# Patient Record
Sex: Female | Born: 1975 | Race: White | Hispanic: No | Smoking: Former smoker
Health system: Southern US, Community
[De-identification: ages and names within clinical notes are randomized; demographics above are authoritative.]

## PROBLEM LIST (undated history)

## (undated) DIAGNOSIS — F1990 Other psychoactive substance use, unspecified, uncomplicated: Secondary | ICD-10-CM

## (undated) DIAGNOSIS — F1010 Alcohol abuse, uncomplicated: Secondary | ICD-10-CM

## (undated) DIAGNOSIS — G629 Polyneuropathy, unspecified: Secondary | ICD-10-CM

## (undated) DIAGNOSIS — G8929 Other chronic pain: Secondary | ICD-10-CM

## (undated) HISTORY — PX: FINGER FRACTURE SURGERY: SHX638

## (undated) HISTORY — PX: WISDOM TOOTH EXTRACTION: SHX21

## (undated) HISTORY — PX: BREAST ENHANCEMENT SURGERY: SHX7

---

## 1997-07-15 ENCOUNTER — Other Ambulatory Visit: Admission: RE | Admit: 1997-07-15 | Discharge: 1997-07-15 | Payer: Self-pay | Admitting: *Deleted

## 1997-08-14 ENCOUNTER — Emergency Department (HOSPITAL_COMMUNITY): Admission: EM | Admit: 1997-08-14 | Discharge: 1997-08-14 | Payer: Self-pay | Admitting: Emergency Medicine

## 1998-02-12 ENCOUNTER — Inpatient Hospital Stay (HOSPITAL_COMMUNITY): Admission: AD | Admit: 1998-02-12 | Discharge: 1998-02-15 | Payer: Self-pay | Admitting: *Deleted

## 1998-02-18 ENCOUNTER — Encounter (HOSPITAL_COMMUNITY): Admission: RE | Admit: 1998-02-18 | Discharge: 1998-05-19 | Payer: Self-pay | Admitting: Psychiatry

## 1998-11-02 ENCOUNTER — Other Ambulatory Visit: Admission: RE | Admit: 1998-11-02 | Discharge: 1998-11-02 | Payer: Self-pay | Admitting: *Deleted

## 1999-01-23 ENCOUNTER — Inpatient Hospital Stay (HOSPITAL_COMMUNITY): Admission: EM | Admit: 1999-01-23 | Discharge: 1999-01-26 | Payer: Self-pay | Admitting: *Deleted

## 1999-03-07 ENCOUNTER — Other Ambulatory Visit: Admission: RE | Admit: 1999-03-07 | Discharge: 1999-03-07 | Payer: Self-pay | Admitting: *Deleted

## 1999-04-04 ENCOUNTER — Other Ambulatory Visit: Admission: RE | Admit: 1999-04-04 | Discharge: 1999-04-04 | Payer: Self-pay | Admitting: *Deleted

## 1999-08-03 ENCOUNTER — Other Ambulatory Visit: Admission: RE | Admit: 1999-08-03 | Discharge: 1999-08-03 | Payer: Self-pay | Admitting: *Deleted

## 2001-02-07 ENCOUNTER — Other Ambulatory Visit: Admission: RE | Admit: 2001-02-07 | Discharge: 2001-02-07 | Payer: Self-pay | Admitting: *Deleted

## 2001-11-14 ENCOUNTER — Emergency Department (HOSPITAL_COMMUNITY): Admission: EM | Admit: 2001-11-14 | Discharge: 2001-11-14 | Payer: Self-pay | Admitting: *Deleted

## 2003-02-07 HISTORY — PX: AUGMENTATION MAMMAPLASTY: SUR837

## 2010-02-06 HISTORY — PX: AUGMENTATION MAMMAPLASTY: SUR837

## 2012-04-24 DIAGNOSIS — M545 Low back pain, unspecified: Secondary | ICD-10-CM | POA: Insufficient documentation

## 2015-09-06 DIAGNOSIS — F19130 Other psychoactive substance abuse with withdrawal, uncomplicated: Secondary | ICD-10-CM | POA: Insufficient documentation

## 2019-08-07 ENCOUNTER — Encounter (HOSPITAL_COMMUNITY): Payer: Self-pay | Admitting: Emergency Medicine

## 2019-08-07 ENCOUNTER — Inpatient Hospital Stay (HOSPITAL_COMMUNITY)
Admission: EM | Admit: 2019-08-07 | Discharge: 2019-09-08 | DRG: 471 | Disposition: A | Discharge status: Home / Self Care | Payer: Medicaid Other | Attending: Internal Medicine | Admitting: Internal Medicine

## 2019-08-07 ENCOUNTER — Other Ambulatory Visit: Payer: Self-pay

## 2019-08-07 DIAGNOSIS — W19XXXA Unspecified fall, initial encounter: Secondary | ICD-10-CM | POA: Diagnosis not present

## 2019-08-07 DIAGNOSIS — Z720 Tobacco use: Secondary | ICD-10-CM

## 2019-08-07 DIAGNOSIS — M25519 Pain in unspecified shoulder: Secondary | ICD-10-CM | POA: Diagnosis not present

## 2019-08-07 DIAGNOSIS — M62838 Other muscle spasm: Secondary | ICD-10-CM | POA: Diagnosis not present

## 2019-08-07 DIAGNOSIS — F1011 Alcohol abuse, in remission: Secondary | ICD-10-CM | POA: Diagnosis present

## 2019-08-07 DIAGNOSIS — F1110 Opioid abuse, uncomplicated: Secondary | ICD-10-CM | POA: Diagnosis present

## 2019-08-07 DIAGNOSIS — M4642 Discitis, unspecified, cervical region: Principal | ICD-10-CM | POA: Diagnosis present

## 2019-08-07 DIAGNOSIS — R000 Tachycardia, unspecified: Secondary | ICD-10-CM | POA: Diagnosis present

## 2019-08-07 DIAGNOSIS — F17210 Nicotine dependence, cigarettes, uncomplicated: Secondary | ICD-10-CM | POA: Diagnosis present

## 2019-08-07 DIAGNOSIS — R739 Hyperglycemia, unspecified: Secondary | ICD-10-CM | POA: Diagnosis present

## 2019-08-07 DIAGNOSIS — R7989 Other specified abnormal findings of blood chemistry: Secondary | ICD-10-CM | POA: Diagnosis present

## 2019-08-07 DIAGNOSIS — D649 Anemia, unspecified: Secondary | ICD-10-CM | POA: Diagnosis present

## 2019-08-07 DIAGNOSIS — M542 Cervicalgia: Secondary | ICD-10-CM | POA: Diagnosis present

## 2019-08-07 DIAGNOSIS — R202 Paresthesia of skin: Secondary | ICD-10-CM | POA: Diagnosis present

## 2019-08-07 DIAGNOSIS — K759 Inflammatory liver disease, unspecified: Secondary | ICD-10-CM

## 2019-08-07 DIAGNOSIS — B182 Chronic viral hepatitis C: Secondary | ICD-10-CM

## 2019-08-07 DIAGNOSIS — Z20822 Contact with and (suspected) exposure to covid-19: Secondary | ICD-10-CM | POA: Diagnosis present

## 2019-08-07 DIAGNOSIS — M48061 Spinal stenosis, lumbar region without neurogenic claudication: Secondary | ICD-10-CM | POA: Diagnosis present

## 2019-08-07 DIAGNOSIS — Z820 Family history of epilepsy and other diseases of the nervous system: Secondary | ICD-10-CM

## 2019-08-07 DIAGNOSIS — G629 Polyneuropathy, unspecified: Secondary | ICD-10-CM | POA: Diagnosis present

## 2019-08-07 DIAGNOSIS — Z6821 Body mass index (BMI) 21.0-21.9, adult: Secondary | ICD-10-CM

## 2019-08-07 DIAGNOSIS — M4312 Spondylolisthesis, cervical region: Secondary | ICD-10-CM | POA: Diagnosis present

## 2019-08-07 DIAGNOSIS — T366X5A Adverse effect of rifampicins, initial encounter: Secondary | ICD-10-CM | POA: Diagnosis not present

## 2019-08-07 DIAGNOSIS — R200 Anesthesia of skin: Secondary | ICD-10-CM | POA: Diagnosis not present

## 2019-08-07 DIAGNOSIS — Z880 Allergy status to penicillin: Secondary | ICD-10-CM

## 2019-08-07 DIAGNOSIS — Z419 Encounter for procedure for purposes other than remedying health state, unspecified: Secondary | ICD-10-CM

## 2019-08-07 DIAGNOSIS — G4700 Insomnia, unspecified: Secondary | ICD-10-CM | POA: Diagnosis not present

## 2019-08-07 DIAGNOSIS — R748 Abnormal levels of other serum enzymes: Secondary | ICD-10-CM | POA: Diagnosis not present

## 2019-08-07 DIAGNOSIS — M4802 Spinal stenosis, cervical region: Secondary | ICD-10-CM | POA: Diagnosis present

## 2019-08-07 DIAGNOSIS — Z8261 Family history of arthritis: Secondary | ICD-10-CM

## 2019-08-07 DIAGNOSIS — R258 Other abnormal involuntary movements: Secondary | ICD-10-CM | POA: Diagnosis present

## 2019-08-07 DIAGNOSIS — F1990 Other psychoactive substance use, unspecified, uncomplicated: Secondary | ICD-10-CM

## 2019-08-07 DIAGNOSIS — M5126 Other intervertebral disc displacement, lumbar region: Secondary | ICD-10-CM | POA: Diagnosis present

## 2019-08-07 DIAGNOSIS — T847XXA Infection and inflammatory reaction due to other internal orthopedic prosthetic devices, implants and grafts, initial encounter: Secondary | ICD-10-CM

## 2019-08-07 DIAGNOSIS — Z888 Allergy status to other drugs, medicaments and biological substances status: Secondary | ICD-10-CM

## 2019-08-07 DIAGNOSIS — T859XXA Unspecified complication of internal prosthetic device, implant and graft, initial encounter: Secondary | ICD-10-CM | POA: Diagnosis not present

## 2019-08-07 DIAGNOSIS — E440 Moderate protein-calorie malnutrition: Secondary | ICD-10-CM | POA: Diagnosis not present

## 2019-08-07 DIAGNOSIS — D72825 Bandemia: Secondary | ICD-10-CM | POA: Diagnosis present

## 2019-08-07 DIAGNOSIS — F1910 Other psychoactive substance abuse, uncomplicated: Secondary | ICD-10-CM | POA: Diagnosis present

## 2019-08-07 DIAGNOSIS — R7982 Elevated C-reactive protein (CRP): Secondary | ICD-10-CM | POA: Diagnosis present

## 2019-08-07 DIAGNOSIS — G062 Extradural and subdural abscess, unspecified: Secondary | ICD-10-CM

## 2019-08-07 DIAGNOSIS — M549 Dorsalgia, unspecified: Secondary | ICD-10-CM

## 2019-08-07 DIAGNOSIS — G8929 Other chronic pain: Secondary | ICD-10-CM | POA: Diagnosis present

## 2019-08-07 DIAGNOSIS — R7881 Bacteremia: Secondary | ICD-10-CM | POA: Diagnosis present

## 2019-08-07 DIAGNOSIS — Z590 Homelessness: Secondary | ICD-10-CM

## 2019-08-07 DIAGNOSIS — B9561 Methicillin susceptible Staphylococcus aureus infection as the cause of diseases classified elsewhere: Secondary | ICD-10-CM | POA: Diagnosis present

## 2019-08-07 DIAGNOSIS — G061 Intraspinal abscess and granuloma: Secondary | ICD-10-CM | POA: Diagnosis present

## 2019-08-07 HISTORY — DX: Polyneuropathy, unspecified: G62.9

## 2019-08-07 HISTORY — DX: Other psychoactive substance use, unspecified, uncomplicated: F19.90

## 2019-08-07 HISTORY — DX: Other chronic pain: G89.29

## 2019-08-07 HISTORY — DX: Alcohol abuse, uncomplicated: F10.10

## 2019-08-07 NOTE — ED Triage Notes (Addendum)
Patient states she has a hx of herniated disk. Patient states that she is having pain in her upper back. Patient states that it is a herniated disc. Patient yelling and crying.

## 2019-08-08 ENCOUNTER — Observation Stay (HOSPITAL_COMMUNITY): Payer: Medicaid Other | Admitting: Certified Registered Nurse Anesthetist

## 2019-08-08 ENCOUNTER — Encounter (HOSPITAL_COMMUNITY): Payer: Self-pay | Admitting: Internal Medicine

## 2019-08-08 ENCOUNTER — Encounter (HOSPITAL_COMMUNITY)
Admission: EM | Disposition: A | Discharge status: Home / Self Care | Payer: Self-pay | Source: Home / Self Care | Attending: Internal Medicine

## 2019-08-08 ENCOUNTER — Observation Stay (HOSPITAL_COMMUNITY): Payer: Medicaid Other

## 2019-08-08 ENCOUNTER — Inpatient Hospital Stay (HOSPITAL_COMMUNITY)
Admission: EM | Disposition: A | Discharge status: Home / Self Care | Payer: Self-pay | Source: Home / Self Care | Attending: Internal Medicine

## 2019-08-08 ENCOUNTER — Inpatient Hospital Stay (HOSPITAL_COMMUNITY): Payer: Medicaid Other | Admitting: Anesthesiology

## 2019-08-08 ENCOUNTER — Inpatient Hospital Stay (HOSPITAL_COMMUNITY): Payer: Medicaid Other

## 2019-08-08 ENCOUNTER — Emergency Department (HOSPITAL_COMMUNITY): Payer: Medicaid Other

## 2019-08-08 DIAGNOSIS — R7982 Elevated C-reactive protein (CRP): Secondary | ICD-10-CM | POA: Diagnosis present

## 2019-08-08 DIAGNOSIS — D72825 Bandemia: Secondary | ICD-10-CM | POA: Diagnosis present

## 2019-08-08 DIAGNOSIS — R7989 Other specified abnormal findings of blood chemistry: Secondary | ICD-10-CM | POA: Diagnosis present

## 2019-08-08 DIAGNOSIS — M4802 Spinal stenosis, cervical region: Secondary | ICD-10-CM | POA: Diagnosis present

## 2019-08-08 DIAGNOSIS — F17210 Nicotine dependence, cigarettes, uncomplicated: Secondary | ICD-10-CM | POA: Diagnosis present

## 2019-08-08 DIAGNOSIS — G062 Extradural and subdural abscess, unspecified: Secondary | ICD-10-CM | POA: Diagnosis not present

## 2019-08-08 DIAGNOSIS — F1990 Other psychoactive substance use, unspecified, uncomplicated: Secondary | ICD-10-CM

## 2019-08-08 DIAGNOSIS — B9561 Methicillin susceptible Staphylococcus aureus infection as the cause of diseases classified elsewhere: Secondary | ICD-10-CM | POA: Diagnosis present

## 2019-08-08 DIAGNOSIS — M542 Cervicalgia: Secondary | ICD-10-CM

## 2019-08-08 DIAGNOSIS — R258 Other abnormal involuntary movements: Secondary | ICD-10-CM | POA: Diagnosis present

## 2019-08-08 DIAGNOSIS — F1910 Other psychoactive substance abuse, uncomplicated: Secondary | ICD-10-CM | POA: Diagnosis not present

## 2019-08-08 DIAGNOSIS — Z720 Tobacco use: Secondary | ICD-10-CM

## 2019-08-08 DIAGNOSIS — Z880 Allergy status to penicillin: Secondary | ICD-10-CM | POA: Diagnosis not present

## 2019-08-08 DIAGNOSIS — Z888 Allergy status to other drugs, medicaments and biological substances status: Secondary | ICD-10-CM | POA: Diagnosis not present

## 2019-08-08 DIAGNOSIS — G8929 Other chronic pain: Secondary | ICD-10-CM | POA: Diagnosis present

## 2019-08-08 DIAGNOSIS — Z6821 Body mass index (BMI) 21.0-21.9, adult: Secondary | ICD-10-CM | POA: Diagnosis not present

## 2019-08-08 DIAGNOSIS — M25519 Pain in unspecified shoulder: Secondary | ICD-10-CM | POA: Diagnosis not present

## 2019-08-08 DIAGNOSIS — M4312 Spondylolisthesis, cervical region: Secondary | ICD-10-CM | POA: Diagnosis present

## 2019-08-08 DIAGNOSIS — E440 Moderate protein-calorie malnutrition: Secondary | ICD-10-CM | POA: Diagnosis not present

## 2019-08-08 DIAGNOSIS — R202 Paresthesia of skin: Secondary | ICD-10-CM | POA: Diagnosis present

## 2019-08-08 DIAGNOSIS — I339 Acute and subacute endocarditis, unspecified: Secondary | ICD-10-CM | POA: Diagnosis not present

## 2019-08-08 DIAGNOSIS — F1110 Opioid abuse, uncomplicated: Secondary | ICD-10-CM | POA: Diagnosis present

## 2019-08-08 DIAGNOSIS — R000 Tachycardia, unspecified: Secondary | ICD-10-CM | POA: Diagnosis present

## 2019-08-08 DIAGNOSIS — R7881 Bacteremia: Secondary | ICD-10-CM | POA: Diagnosis present

## 2019-08-08 DIAGNOSIS — D72829 Elevated white blood cell count, unspecified: Secondary | ICD-10-CM | POA: Diagnosis not present

## 2019-08-08 DIAGNOSIS — D649 Anemia, unspecified: Secondary | ICD-10-CM | POA: Diagnosis present

## 2019-08-08 DIAGNOSIS — Z20822 Contact with and (suspected) exposure to covid-19: Secondary | ICD-10-CM | POA: Diagnosis present

## 2019-08-08 DIAGNOSIS — G8918 Other acute postprocedural pain: Secondary | ICD-10-CM | POA: Diagnosis not present

## 2019-08-08 DIAGNOSIS — M4642 Discitis, unspecified, cervical region: Secondary | ICD-10-CM | POA: Diagnosis present

## 2019-08-08 DIAGNOSIS — G061 Intraspinal abscess and granuloma: Secondary | ICD-10-CM | POA: Diagnosis present

## 2019-08-08 DIAGNOSIS — B182 Chronic viral hepatitis C: Secondary | ICD-10-CM | POA: Diagnosis not present

## 2019-08-08 DIAGNOSIS — R739 Hyperglycemia, unspecified: Secondary | ICD-10-CM | POA: Diagnosis present

## 2019-08-08 DIAGNOSIS — T847XXD Infection and inflammatory reaction due to other internal orthopedic prosthetic devices, implants and grafts, subsequent encounter: Secondary | ICD-10-CM | POA: Diagnosis not present

## 2019-08-08 DIAGNOSIS — W19XXXA Unspecified fall, initial encounter: Secondary | ICD-10-CM | POA: Diagnosis not present

## 2019-08-08 HISTORY — PX: RADIOLOGY WITH ANESTHESIA: SHX6223

## 2019-08-08 LAB — CBC WITH DIFFERENTIAL/PLATELET
Abs Immature Granulocytes: 0.08 10*3/uL — ABNORMAL HIGH (ref 0.00–0.07)
Basophils Absolute: 0 10*3/uL (ref 0.0–0.1)
Basophils Relative: 0 %
Eosinophils Absolute: 0.1 10*3/uL (ref 0.0–0.5)
Eosinophils Relative: 1 %
HCT: 35.8 % — ABNORMAL LOW (ref 36.0–46.0)
Hemoglobin: 12 g/dL (ref 12.0–15.0)
Immature Granulocytes: 0 %
Lymphocytes Relative: 7 %
Lymphs Abs: 1.3 10*3/uL (ref 0.7–4.0)
MCH: 31.7 pg (ref 26.0–34.0)
MCHC: 33.5 g/dL (ref 30.0–36.0)
MCV: 94.5 fL (ref 80.0–100.0)
Monocytes Absolute: 1.4 10*3/uL — ABNORMAL HIGH (ref 0.1–1.0)
Monocytes Relative: 7 %
Neutro Abs: 16.4 10*3/uL — ABNORMAL HIGH (ref 1.7–7.7)
Neutrophils Relative %: 85 %
Platelets: 347 10*3/uL (ref 150–400)
RBC: 3.79 MIL/uL — ABNORMAL LOW (ref 3.87–5.11)
RDW: 12.5 % (ref 11.5–15.5)
WBC: 19.4 10*3/uL — ABNORMAL HIGH (ref 4.0–10.5)
nRBC: 0 % (ref 0.0–0.2)

## 2019-08-08 LAB — CBC
HCT: 36.3 % (ref 36.0–46.0)
Hemoglobin: 11.8 g/dL — ABNORMAL LOW (ref 12.0–15.0)
MCH: 30.9 pg (ref 26.0–34.0)
MCHC: 32.5 g/dL (ref 30.0–36.0)
MCV: 95 fL (ref 80.0–100.0)
Platelets: 344 10*3/uL (ref 150–400)
RBC: 3.82 MIL/uL — ABNORMAL LOW (ref 3.87–5.11)
RDW: 12.5 % (ref 11.5–15.5)
WBC: 18.9 10*3/uL — ABNORMAL HIGH (ref 4.0–10.5)
nRBC: 0 % (ref 0.0–0.2)

## 2019-08-08 LAB — COMPREHENSIVE METABOLIC PANEL
ALT: 22 U/L (ref 0–44)
AST: 30 U/L (ref 15–41)
Albumin: 4.5 g/dL (ref 3.5–5.0)
Alkaline Phosphatase: 89 U/L (ref 38–126)
Anion gap: 9 (ref 5–15)
BUN: 11 mg/dL (ref 6–20)
CO2: 28 mmol/L (ref 22–32)
Calcium: 9 mg/dL (ref 8.9–10.3)
Chloride: 99 mmol/L (ref 98–111)
Creatinine, Ser: 0.49 mg/dL (ref 0.44–1.00)
GFR calc Af Amer: >60 mL/min (ref >60)
GFR calc non Af Amer: >60 mL/min (ref >60)
Glucose, Bld: 143 mg/dL — ABNORMAL HIGH (ref 70–99)
Potassium: 3.5 mmol/L (ref 3.5–5.1)
Sodium: 136 mmol/L (ref 135–145)
Total Bilirubin: 0.8 mg/dL (ref 0.3–1.2)
Total Protein: 8.2 g/dL — ABNORMAL HIGH (ref 6.5–8.1)

## 2019-08-08 LAB — CREATININE, SERUM
Creatinine, Ser: 0.55 mg/dL (ref 0.44–1.00)
GFR calc Af Amer: >60 mL/min (ref >60)
GFR calc non Af Amer: >60 mL/min (ref >60)

## 2019-08-08 LAB — SARS CORONAVIRUS 2 BY RT PCR (HOSPITAL ORDER, PERFORMED IN ~~LOC~~ HOSPITAL LAB): SARS Coronavirus 2: NEGATIVE

## 2019-08-08 LAB — HCG, SERUM, QUALITATIVE: Preg, Serum: NEGATIVE

## 2019-08-08 LAB — C-REACTIVE PROTEIN: CRP: 9.4 mg/dL — ABNORMAL HIGH (ref <1.0)

## 2019-08-08 LAB — SEDIMENTATION RATE: Sed Rate: 18 mm/hr (ref 0–22)

## 2019-08-08 LAB — HIV ANTIBODY (ROUTINE TESTING W REFLEX): HIV Screen 4th Generation wRfx: NONREACTIVE

## 2019-08-08 SURGERY — MRI WITH ANESTHESIA
Anesthesia: General

## 2019-08-08 SURGERY — POSTERIOR LUMBAR FUSION 2 LEVEL
Anesthesia: General | Site: Neck

## 2019-08-08 MED ORDER — ACETAMINOPHEN 160 MG/5ML PO SOLN: 325.0000 mg | Freq: Once | ORAL | Status: DC | PRN

## 2019-08-08 MED ORDER — FENTANYL CITRATE (PF) 100 MCG/2ML IJ SOLN
INTRAMUSCULAR | Status: DC | PRN
  Administered 2019-08-08 (×2): 50 ug via INTRAVENOUS

## 2019-08-08 MED ORDER — ONDANSETRON 4 MG PO TBDP: 4.0000 mg | ORAL_TABLET | Freq: Four times a day (QID) | ORAL | Status: AC | PRN

## 2019-08-08 MED ORDER — PROMETHAZINE HCL 25 MG/ML IJ SOLN: 6.2500 mg | INTRAMUSCULAR | Status: DC | PRN

## 2019-08-08 MED ORDER — NAPROXEN 250 MG PO TABS
500.0000 mg | ORAL_TABLET | Freq: Two times a day (BID) | ORAL | Status: AC | PRN
  Administered 2019-08-09 – 2019-08-12 (×3): 500 mg via ORAL
  Filled 2019-08-08 (×3): qty 2

## 2019-08-08 MED ORDER — MEPERIDINE HCL 25 MG/ML IJ SOLN: 6.2500 mg | INTRAMUSCULAR | Status: DC | PRN

## 2019-08-08 MED ORDER — 0.9 % SODIUM CHLORIDE (POUR BTL) OPTIME
TOPICAL | Status: DC | PRN
  Administered 2019-08-08: 1000 mL

## 2019-08-08 MED ORDER — ACETAMINOPHEN 10 MG/ML IV SOLN: 1000.0000 mg | Freq: Once | INTRAVENOUS | Status: DC | PRN

## 2019-08-08 MED ORDER — FENTANYL CITRATE (PF) 100 MCG/2ML IJ SOLN
INTRAMUSCULAR | Status: AC
  Filled 2019-08-08: qty 2

## 2019-08-08 MED ORDER — KETOROLAC TROMETHAMINE 15 MG/ML IJ SOLN
15.0000 mg | Freq: Once | INTRAMUSCULAR | Status: AC
  Administered 2019-08-08: 15 mg via INTRAVENOUS
  Filled 2019-08-08: qty 1

## 2019-08-08 MED ORDER — HYDROMORPHONE HCL 1 MG/ML IJ SOLN
4.0000 mg | Freq: Once | INTRAMUSCULAR | Status: AC
  Administered 2019-08-08: 4 mg via INTRAVENOUS
  Filled 2019-08-08: qty 4

## 2019-08-08 MED ORDER — DEXAMETHASONE SODIUM PHOSPHATE 10 MG/ML IJ SOLN
INTRAMUSCULAR | Status: DC | PRN
  Administered 2019-08-08: 4 mg via INTRAVENOUS

## 2019-08-08 MED ORDER — PHENYLEPHRINE 40 MCG/ML (10ML) SYRINGE FOR IV PUSH (FOR BLOOD PRESSURE SUPPORT)
PREFILLED_SYRINGE | INTRAVENOUS | Status: AC
  Filled 2019-08-08: qty 10

## 2019-08-08 MED ORDER — OXYCODONE HCL 5 MG PO TABS: 5.0000 mg | ORAL_TABLET | Freq: Once | ORAL | Status: DC | PRN

## 2019-08-08 MED ORDER — PROPOFOL 10 MG/ML IV BOLUS
INTRAVENOUS | Status: DC | PRN
  Administered 2019-08-08: 200 mg via INTRAVENOUS

## 2019-08-08 MED ORDER — HYDROMORPHONE HCL 1 MG/ML IJ SOLN: 0.2500 mg | INTRAMUSCULAR | Status: DC | PRN

## 2019-08-08 MED ORDER — ACETAMINOPHEN 500 MG PO TABS
ORAL_TABLET | ORAL | Status: AC
  Filled 2019-08-08: qty 2

## 2019-08-08 MED ORDER — SUCCINYLCHOLINE 20MG/ML (10ML) SYRINGE FOR MEDFUSION PUMP - OPTIME
INTRAMUSCULAR | Status: DC | PRN
  Administered 2019-08-08: 60 mg via INTRAVENOUS

## 2019-08-08 MED ORDER — LIDOCAINE-EPINEPHRINE 1 %-1:100000 IJ SOLN
INTRAMUSCULAR | Status: DC | PRN
  Administered 2019-08-08: 3.5 mL via INTRADERMAL

## 2019-08-08 MED ORDER — LACTATED RINGERS IV SOLN: INTRAVENOUS | Status: DC | PRN

## 2019-08-08 MED ORDER — PROPOFOL 10 MG/ML IV BOLUS
INTRAVENOUS | Status: AC
  Filled 2019-08-08: qty 20

## 2019-08-08 MED ORDER — ACETAMINOPHEN 325 MG PO TABS: 325.0000 mg | ORAL_TABLET | Freq: Once | ORAL | Status: DC | PRN

## 2019-08-08 MED ORDER — FENTANYL CITRATE (PF) 100 MCG/2ML IJ SOLN
25.0000 ug | INTRAMUSCULAR | Status: DC | PRN
  Administered 2019-08-08 (×3): 50 ug via INTRAVENOUS

## 2019-08-08 MED ORDER — VANCOMYCIN HCL IN DEXTROSE 1-5 GM/200ML-% IV SOLN
1000.0000 mg | Freq: Two times a day (BID) | INTRAVENOUS | Status: DC
  Administered 2019-08-09 – 2019-08-10 (×3): 1000 mg via INTRAVENOUS
  Filled 2019-08-08 (×5): qty 200

## 2019-08-08 MED ORDER — METHOCARBAMOL 1000 MG/10ML IJ SOLN: 500.0000 mg | Freq: Once | INTRAMUSCULAR | Status: DC

## 2019-08-08 MED ORDER — METHOCARBAMOL 1000 MG/10ML IJ SOLN
500.0000 mg | Freq: Once | INTRAVENOUS | Status: AC
  Administered 2019-08-08: 500 mg via INTRAVENOUS
  Filled 2019-08-08: qty 500

## 2019-08-08 MED ORDER — HYDROXYZINE HCL 25 MG PO TABS
25.0000 mg | ORAL_TABLET | Freq: Four times a day (QID) | ORAL | Status: AC | PRN
  Administered 2019-08-08 – 2019-08-11 (×4): 25 mg via ORAL
  Filled 2019-08-08 (×4): qty 1

## 2019-08-08 MED ORDER — DEXMEDETOMIDINE HCL 200 MCG/2ML IV SOLN
INTRAVENOUS | Status: DC | PRN
  Administered 2019-08-08: 8 ug via INTRAVENOUS
  Administered 2019-08-08: 20 ug via INTRAVENOUS

## 2019-08-08 MED ORDER — MIDAZOLAM HCL 2 MG/2ML IJ SOLN
INTRAMUSCULAR | Status: AC
  Filled 2019-08-08: qty 2

## 2019-08-08 MED ORDER — SODIUM CHLORIDE 0.9 % IV SOLN
1000.0000 mL | INTRAVENOUS | Status: DC
  Administered 2019-08-08: 1000 mL via INTRAVENOUS

## 2019-08-08 MED ORDER — CLONIDINE HCL 0.1 MG PO TABS
0.1000 mg | ORAL_TABLET | Freq: Every day | ORAL | Status: AC
  Administered 2019-08-13 – 2019-08-14 (×2): 0.1 mg via ORAL
  Filled 2019-08-08 (×2): qty 1

## 2019-08-08 MED ORDER — BUPIVACAINE HCL (PF) 0.5 % IJ SOLN
INTRAMUSCULAR | Status: DC | PRN
  Administered 2019-08-08: 3.5 mL

## 2019-08-08 MED ORDER — OXYCODONE HCL 5 MG PO TABS
5.0000 mg | ORAL_TABLET | ORAL | Status: DC | PRN
  Administered 2019-08-08 – 2019-08-13 (×11): 10 mg via ORAL
  Administered 2019-08-14: 5 mg via ORAL
  Administered 2019-08-14: 10 mg via ORAL
  Administered 2019-08-14 – 2019-08-15 (×4): 5 mg via ORAL
  Administered 2019-08-17 – 2019-08-19 (×2): 10 mg via ORAL
  Filled 2019-08-08 (×2): qty 1
  Filled 2019-08-08 (×3): qty 2
  Filled 2019-08-08: qty 1
  Filled 2019-08-08: qty 2
  Filled 2019-08-08: qty 1
  Filled 2019-08-08 (×4): qty 2
  Filled 2019-08-08: qty 1
  Filled 2019-08-08 (×3): qty 2
  Filled 2019-08-08 (×2): qty 1
  Filled 2019-08-08 (×4): qty 2

## 2019-08-08 MED ORDER — ACETAMINOPHEN 500 MG PO TABS
1000.0000 mg | ORAL_TABLET | Freq: Once | ORAL | Status: AC
  Administered 2019-08-08: 1000 mg via ORAL
  Filled 2019-08-08: qty 2

## 2019-08-08 MED ORDER — LACTATED RINGERS IV SOLN: INTRAVENOUS | Status: DC

## 2019-08-08 MED ORDER — ACETAMINOPHEN 325 MG PO TABS
650.0000 mg | ORAL_TABLET | Freq: Four times a day (QID) | ORAL | Status: DC | PRN
  Administered 2019-08-10 – 2019-09-03 (×3): 650 mg via ORAL
  Filled 2019-08-08 (×3): qty 2

## 2019-08-08 MED ORDER — LIDOCAINE-EPINEPHRINE 1 %-1:100000 IJ SOLN
INTRAMUSCULAR | Status: AC
  Filled 2019-08-08: qty 1

## 2019-08-08 MED ORDER — PHENYLEPHRINE HCL (PRESSORS) 10 MG/ML IV SOLN
INTRAVENOUS | Status: DC | PRN
  Administered 2019-08-08 (×3): 80 ug via INTRAVENOUS

## 2019-08-08 MED ORDER — FENTANYL CITRATE (PF) 250 MCG/5ML IJ SOLN
INTRAMUSCULAR | Status: DC | PRN
  Administered 2019-08-08: 50 ug via INTRAVENOUS
  Administered 2019-08-08 (×2): 100 ug via INTRAVENOUS

## 2019-08-08 MED ORDER — SUGAMMADEX SODIUM 200 MG/2ML IV SOLN
INTRAVENOUS | Status: DC | PRN
  Administered 2019-08-08: 200 mg via INTRAVENOUS

## 2019-08-08 MED ORDER — THROMBIN 5000 UNITS EX SOLR
OROMUCOSAL | Status: DC | PRN
  Administered 2019-08-08 (×2): 5 mL via TOPICAL

## 2019-08-08 MED ORDER — MIDAZOLAM HCL 5 MG/5ML IJ SOLN
INTRAMUSCULAR | Status: DC | PRN
  Administered 2019-08-08: 2 mg via INTRAVENOUS

## 2019-08-08 MED ORDER — HYDROMORPHONE HCL 2 MG/ML IJ SOLN: 4.0000 mg | Freq: Once | INTRAMUSCULAR | Status: DC

## 2019-08-08 MED ORDER — ROCURONIUM 10MG/ML (10ML) SYRINGE FOR MEDFUSION PUMP - OPTIME
INTRAVENOUS | Status: DC | PRN
  Administered 2019-08-08: 20 mg via INTRAVENOUS
  Administered 2019-08-08: 10 mg via INTRAVENOUS
  Administered 2019-08-08: 40 mg via INTRAVENOUS

## 2019-08-08 MED ORDER — CLONIDINE HCL 0.1 MG PO TABS
0.1000 mg | ORAL_TABLET | ORAL | Status: AC
  Administered 2019-08-11 – 2019-08-12 (×4): 0.1 mg via ORAL
  Filled 2019-08-08 (×4): qty 1

## 2019-08-08 MED ORDER — PHENYLEPHRINE HCL-NACL 10-0.9 MG/250ML-% IV SOLN
INTRAVENOUS | Status: DC | PRN
  Administered 2019-08-08: 50 ug/min via INTRAVENOUS

## 2019-08-08 MED ORDER — SODIUM CHLORIDE 0.9 % IV SOLN
INTRAVENOUS | Status: DC | PRN
  Administered 2019-08-08: 500 mL

## 2019-08-08 MED ORDER — OXYCODONE HCL 5 MG PO TABS: 5.0000 mg | ORAL_TABLET | ORAL | Status: DC | PRN

## 2019-08-08 MED ORDER — LOPERAMIDE HCL 2 MG PO CAPS: 2.0000 mg | ORAL_CAPSULE | ORAL | Status: AC | PRN

## 2019-08-08 MED ORDER — KETOROLAC TROMETHAMINE 60 MG/2ML IM SOLN: 30.0000 mg | Freq: Once | INTRAMUSCULAR | Status: DC

## 2019-08-08 MED ORDER — BUPIVACAINE HCL (PF) 0.5 % IJ SOLN
INTRAMUSCULAR | Status: AC
  Filled 2019-08-08: qty 30

## 2019-08-08 MED ORDER — DEXAMETHASONE SODIUM PHOSPHATE 10 MG/ML IJ SOLN
INTRAMUSCULAR | Status: DC | PRN
  Administered 2019-08-08: 10 mg via INTRAVENOUS

## 2019-08-08 MED ORDER — HYDROMORPHONE HCL 1 MG/ML IJ SOLN: 0.5000 mg | INTRAMUSCULAR | Status: DC | PRN

## 2019-08-08 MED ORDER — HYDROMORPHONE HCL 1 MG/ML IJ SOLN
2.0000 mg | Freq: Once | INTRAMUSCULAR | Status: AC
  Administered 2019-08-08: 2 mg via INTRAVENOUS
  Filled 2019-08-08: qty 2

## 2019-08-08 MED ORDER — ACETAMINOPHEN 325 MG PO TABS
650.0000 mg | ORAL_TABLET | Freq: Once | ORAL | Status: AC
  Administered 2019-08-08: 650 mg via ORAL
  Filled 2019-08-08: qty 2

## 2019-08-08 MED ORDER — METHOCARBAMOL 500 MG PO TABS
500.0000 mg | ORAL_TABLET | Freq: Three times a day (TID) | ORAL | Status: DC | PRN
  Administered 2019-08-08 – 2019-08-10 (×4): 500 mg via ORAL
  Filled 2019-08-08 (×5): qty 1

## 2019-08-08 MED ORDER — HYDROMORPHONE HCL 1 MG/ML IJ SOLN
1.0000 mg | INTRAMUSCULAR | Status: DC | PRN
  Administered 2019-08-08 – 2019-08-11 (×18): 2 mg via INTRAVENOUS
  Administered 2019-08-11 (×3): 1 mg via INTRAVENOUS
  Administered 2019-08-11 (×3): 2 mg via INTRAVENOUS
  Administered 2019-08-12: 1 mg via INTRAVENOUS
  Administered 2019-08-12: 2 mg via INTRAVENOUS
  Administered 2019-08-12 (×5): 1 mg via INTRAVENOUS
  Administered 2019-08-13 (×4): 2 mg via INTRAVENOUS
  Administered 2019-08-14: 1 mg via INTRAVENOUS
  Administered 2019-08-14 (×3): 2 mg via INTRAVENOUS
  Administered 2019-08-14: 1 mg via INTRAVENOUS
  Administered 2019-08-14 (×2): 2 mg via INTRAVENOUS
  Administered 2019-08-14: 1 mg via INTRAVENOUS
  Administered 2019-08-14 – 2019-08-17 (×28): 2 mg via INTRAVENOUS
  Administered 2019-08-17: 1 mg via INTRAVENOUS
  Administered 2019-08-17: 2 mg via INTRAVENOUS
  Administered 2019-08-17: 1 mg via INTRAVENOUS
  Administered 2019-08-17 – 2019-08-18 (×4): 2 mg via INTRAVENOUS
  Administered 2019-08-18: 1 mg via INTRAVENOUS
  Administered 2019-08-18: 2 mg via INTRAVENOUS
  Administered 2019-08-18: 1 mg via INTRAVENOUS
  Administered 2019-08-18: 2 mg via INTRAVENOUS
  Administered 2019-08-18: 1 mg via INTRAVENOUS
  Administered 2019-08-18: 2 mg via INTRAVENOUS
  Administered 2019-08-18: 1 mg via INTRAVENOUS
  Administered 2019-08-18: 2 mg via INTRAVENOUS
  Administered 2019-08-18: 1 mg via INTRAVENOUS
  Administered 2019-08-19 (×4): 2 mg via INTRAVENOUS
  Filled 2019-08-08 (×4): qty 2
  Filled 2019-08-08: qty 1
  Filled 2019-08-08 (×2): qty 2
  Filled 2019-08-08: qty 1
  Filled 2019-08-08: qty 2
  Filled 2019-08-08: qty 1
  Filled 2019-08-08 (×7): qty 2
  Filled 2019-08-08: qty 1
  Filled 2019-08-08 (×8): qty 2
  Filled 2019-08-08: qty 1
  Filled 2019-08-08: qty 2
  Filled 2019-08-08: qty 1
  Filled 2019-08-08 (×6): qty 2
  Filled 2019-08-08: qty 1
  Filled 2019-08-08 (×3): qty 2
  Filled 2019-08-08: qty 1
  Filled 2019-08-08 (×7): qty 2
  Filled 2019-08-08 (×2): qty 1
  Filled 2019-08-08 (×5): qty 2
  Filled 2019-08-08: qty 1
  Filled 2019-08-08: qty 2
  Filled 2019-08-08: qty 1
  Filled 2019-08-08 (×11): qty 2
  Filled 2019-08-08: qty 1
  Filled 2019-08-08 (×4): qty 2
  Filled 2019-08-08: qty 1
  Filled 2019-08-08 (×4): qty 2
  Filled 2019-08-08: qty 1
  Filled 2019-08-08 (×2): qty 2
  Filled 2019-08-08: qty 1
  Filled 2019-08-08: qty 2
  Filled 2019-08-08 (×3): qty 1
  Filled 2019-08-08 (×2): qty 2
  Filled 2019-08-08: qty 1
  Filled 2019-08-08 (×6): qty 2

## 2019-08-08 MED ORDER — DICYCLOMINE HCL 20 MG PO TABS: 20.0000 mg | ORAL_TABLET | Freq: Four times a day (QID) | ORAL | Status: AC | PRN

## 2019-08-08 MED ORDER — LEVOFLOXACIN IN D5W 750 MG/150ML IV SOLN
750.0000 mg | INTRAVENOUS | Status: DC
  Filled 2019-08-08: qty 150

## 2019-08-08 MED ORDER — THROMBIN 5000 UNITS EX SOLR
CUTANEOUS | Status: AC
  Filled 2019-08-08: qty 5000

## 2019-08-08 MED ORDER — ENOXAPARIN SODIUM 40 MG/0.4ML ~~LOC~~ SOLN
40.0000 mg | SUBCUTANEOUS | Status: DC
  Administered 2019-08-09 – 2019-09-04 (×27): 40 mg via SUBCUTANEOUS
  Filled 2019-08-08 (×30): qty 0.4

## 2019-08-08 MED ORDER — LORAZEPAM 2 MG/ML IJ SOLN
1.0000 mg | Freq: Once | INTRAMUSCULAR | Status: AC
  Administered 2019-08-08: 1 mg via INTRAVENOUS

## 2019-08-08 MED ORDER — GADOBUTROL 1 MMOL/ML IV SOLN
6.0000 mL | Freq: Once | INTRAVENOUS | Status: AC | PRN
  Administered 2019-08-08: 6 mL via INTRAVENOUS

## 2019-08-08 MED ORDER — METHOCARBAMOL 1000 MG/10ML IJ SOLN
500.0000 mg | Freq: Four times a day (QID) | INTRAVENOUS | Status: DC | PRN
  Filled 2019-08-08: qty 5

## 2019-08-08 MED ORDER — ONDANSETRON HCL 4 MG/2ML IJ SOLN
INTRAMUSCULAR | Status: DC | PRN
  Administered 2019-08-08: 4 mg via INTRAVENOUS

## 2019-08-08 MED ORDER — LIDOCAINE 2% (20 MG/ML) 5 ML SYRINGE
INTRAMUSCULAR | Status: DC | PRN
  Administered 2019-08-08: 100 mg via INTRAVENOUS

## 2019-08-08 MED ORDER — CLONIDINE HCL 0.1 MG PO TABS
0.1000 mg | ORAL_TABLET | Freq: Four times a day (QID) | ORAL | Status: AC
  Administered 2019-08-08 – 2019-08-10 (×9): 0.1 mg via ORAL
  Filled 2019-08-08 (×10): qty 1

## 2019-08-08 MED ORDER — OXYCODONE HCL 5 MG/5ML PO SOLN: 5.0000 mg | Freq: Once | ORAL | Status: DC | PRN

## 2019-08-08 MED ORDER — FENTANYL CITRATE (PF) 250 MCG/5ML IJ SOLN
INTRAMUSCULAR | Status: AC
  Filled 2019-08-08: qty 5

## 2019-08-08 MED ORDER — VANCOMYCIN HCL 1250 MG/250ML IV SOLN
1250.0000 mg | Freq: Once | INTRAVENOUS | Status: AC
  Administered 2019-08-08: 1250 mg via INTRAVENOUS
  Filled 2019-08-08: qty 250

## 2019-08-08 MED ORDER — SODIUM CHLORIDE 0.9 % IV BOLUS (SEPSIS)
1000.0000 mL | Freq: Once | INTRAVENOUS | Status: AC
  Administered 2019-08-08: 1000 mL via INTRAVENOUS

## 2019-08-08 MED ORDER — ACETAMINOPHEN 650 MG RE SUPP: 650.0000 mg | Freq: Four times a day (QID) | RECTAL | Status: DC | PRN

## 2019-08-08 MED ORDER — LIDOCAINE-EPINEPHRINE 2 %-1:100000 IJ SOLN
INTRAMUSCULAR | Status: AC
  Filled 2019-08-08: qty 1

## 2019-08-08 MED ORDER — LIDOCAINE HCL (CARDIAC) PF 100 MG/5ML IV SOSY
PREFILLED_SYRINGE | INTRAVENOUS | Status: DC | PRN
  Administered 2019-08-08: 60 mg via INTRAVENOUS

## 2019-08-08 MED ORDER — DIAZEPAM 5 MG/ML IJ SOLN
5.0000 mg | Freq: Once | INTRAMUSCULAR | Status: AC
  Administered 2019-08-08: 5 mg via INTRAVENOUS
  Filled 2019-08-08: qty 2

## 2019-08-08 MED ORDER — METHOCARBAMOL 500 MG PO TABS: 500.0000 mg | ORAL_TABLET | Freq: Once | ORAL | Status: DC

## 2019-08-08 MED ORDER — LORAZEPAM 2 MG/ML IJ SOLN
INTRAMUSCULAR | Status: AC
  Filled 2019-08-08: qty 1

## 2019-08-08 MED ORDER — PROPOFOL 10 MG/ML IV BOLUS
INTRAVENOUS | Status: DC | PRN
  Administered 2019-08-08: 130 mg via INTRAVENOUS

## 2019-08-08 MED ORDER — BACITRACIN ZINC 500 UNIT/GM EX OINT
TOPICAL_OINTMENT | CUTANEOUS | Status: AC
  Filled 2019-08-08: qty 28.35

## 2019-08-08 SURGICAL SUPPLY — 75 items
ADH SKN CLS APL DERMABOND .7 (GAUZE/BANDAGES/DRESSINGS) ×1
APL SKNCLS STERI-STRIP NONHPOA (GAUZE/BANDAGES/DRESSINGS)
BAG DECANTER FOR FLEXI CONT (MISCELLANEOUS) ×3 IMPLANT
BASKET BONE COLLECTION (BASKET) ×3 IMPLANT
BENZOIN TINCTURE PRP APPL 2/3 (GAUZE/BANDAGES/DRESSINGS) IMPLANT
BIT DRILL 2.4 (BIT) ×1 IMPLANT
BIT DRILL 2.4MM (BIT) ×1
BLADE CLIPPER SURG (BLADE) IMPLANT
BLADE SURG 11 STRL SS (BLADE) ×3 IMPLANT
BUR MATCHSTICK NEURO 3.0 LAGG (BURR) ×3 IMPLANT
BUR PRECISION FLUTE 5.0 (BURR) ×3 IMPLANT
CANISTER SUCT 3000ML PPV (MISCELLANEOUS) ×3 IMPLANT
CLOSURE WOUND 1/2 X4 (GAUZE/BANDAGES/DRESSINGS)
CNTNR URN SCR LID CUP LEK RST (MISCELLANEOUS) ×1 IMPLANT
CONT SPEC 4OZ STRL OR WHT (MISCELLANEOUS) ×3
COVER BACK TABLE 60X90IN (DRAPES) ×3 IMPLANT
COVER WAND RF STERILE (DRAPES) ×3 IMPLANT
DECANTER SPIKE VIAL GLASS SM (MISCELLANEOUS) ×3 IMPLANT
DERMABOND ADVANCED (GAUZE/BANDAGES/DRESSINGS) ×2
DERMABOND ADVANCED .7 DNX12 (GAUZE/BANDAGES/DRESSINGS) ×1 IMPLANT
DRAPE C-ARM 42X72 X-RAY (DRAPES) ×4 IMPLANT
DRAPE C-ARMOR (DRAPES) ×4 IMPLANT
DRAPE LAPAROTOMY 100X72X124 (DRAPES) ×3 IMPLANT
DRAPE SURG 17X23 STRL (DRAPES) ×3 IMPLANT
DURAPREP 26ML APPLICATOR (WOUND CARE) ×3 IMPLANT
ELECT REM PT RETURN 9FT ADLT (ELECTROSURGICAL) ×3
ELECTRODE REM PT RTRN 9FT ADLT (ELECTROSURGICAL) ×1 IMPLANT
GAUZE 4X4 16PLY RFD (DISPOSABLE) IMPLANT
GAUZE SPONGE 4X4 12PLY STRL (GAUZE/BANDAGES/DRESSINGS) IMPLANT
GLOVE BIO SURGEON STRL SZ7 (GLOVE) ×6 IMPLANT
GLOVE BIO SURGEON STRL SZ7.5 (GLOVE) ×6 IMPLANT
GLOVE BIOGEL PI IND STRL 7.5 (GLOVE) ×2 IMPLANT
GLOVE BIOGEL PI IND STRL 8.5 (GLOVE) IMPLANT
GLOVE BIOGEL PI INDICATOR 7.5 (GLOVE) ×6
GLOVE BIOGEL PI INDICATOR 8.5 (GLOVE) ×2
GLOVE ECLIPSE 7.5 STRL STRAW (GLOVE) ×2 IMPLANT
GLOVE ECLIPSE 8.5 STRL (GLOVE) ×2 IMPLANT
GLOVE EXAM NITRILE LRG STRL (GLOVE) IMPLANT
GLOVE EXAM NITRILE XL STR (GLOVE) IMPLANT
GLOVE EXAM NITRILE XS STR PU (GLOVE) IMPLANT
GOWN STRL REUS W/ TWL LRG LVL3 (GOWN DISPOSABLE) ×4 IMPLANT
GOWN STRL REUS W/ TWL XL LVL3 (GOWN DISPOSABLE) IMPLANT
GOWN STRL REUS W/TWL 2XL LVL3 (GOWN DISPOSABLE) ×2 IMPLANT
GOWN STRL REUS W/TWL LRG LVL3 (GOWN DISPOSABLE) ×12
GOWN STRL REUS W/TWL XL LVL3 (GOWN DISPOSABLE)
HEMOSTAT POWDER KIT SURGIFOAM (HEMOSTASIS) ×3 IMPLANT
KIT BASIN OR (CUSTOM PROCEDURE TRAY) ×3 IMPLANT
KIT POSITION SURG JACKSON T1 (MISCELLANEOUS) ×3 IMPLANT
KIT TURNOVER KIT B (KITS) ×3 IMPLANT
MILL MEDIUM DISP (BLADE) ×3 IMPLANT
NDL HYPO 18GX1.5 BLUNT FILL (NEEDLE) IMPLANT
NDL SPNL 18GX3.5 QUINCKE PK (NEEDLE) IMPLANT
NEEDLE HYPO 18GX1.5 BLUNT FILL (NEEDLE) IMPLANT
NEEDLE HYPO 22GX1.5 SAFETY (NEEDLE) ×3 IMPLANT
NEEDLE SPNL 18GX3.5 QUINCKE PK (NEEDLE) IMPLANT
NS IRRIG 1000ML POUR BTL (IV SOLUTION) ×3 IMPLANT
PACK LAMINECTOMY NEURO (CUSTOM PROCEDURE TRAY) ×3 IMPLANT
PAD ARMBOARD 7.5X6 YLW CONV (MISCELLANEOUS) ×9 IMPLANT
ROD PRE CUT 3.5X40MM SPINAL (Rod) ×2 IMPLANT
ROD PRECUT 3.5X60 (Rod) ×2 IMPLANT
SCREW MA INFINITY 3.5X12 (Screw) ×16 IMPLANT
SCREW SET 3600315 STANDARD (Screw) ×21 IMPLANT
SCREW SET 3600315 STD (Screw) IMPLANT
SPONGE LAP 4X18 RFD (DISPOSABLE) IMPLANT
SPONGE SURGIFOAM ABS GEL 100 (HEMOSTASIS) IMPLANT
STRIP CLOSURE SKIN 1/2X4 (GAUZE/BANDAGES/DRESSINGS) IMPLANT
SUT MNCRL AB 3-0 PS2 18 (SUTURE) ×3 IMPLANT
SUT VIC AB 0 CT1 18XCR BRD8 (SUTURE) ×1 IMPLANT
SUT VIC AB 0 CT1 8-18 (SUTURE) ×6
SUT VIC AB 2-0 CP2 18 (SUTURE) ×3 IMPLANT
SYR 3ML LL SCALE MARK (SYRINGE) IMPLANT
TOWEL GREEN STERILE (TOWEL DISPOSABLE) ×3 IMPLANT
TOWEL GREEN STERILE FF (TOWEL DISPOSABLE) ×3 IMPLANT
TRAY FOLEY MTR SLVR 16FR STAT (SET/KITS/TRAYS/PACK) ×3 IMPLANT
WATER STERILE IRR 1000ML POUR (IV SOLUTION) ×3 IMPLANT

## 2019-08-08 NOTE — ED Provider Notes (Signed)
[facility] EMERGENCY DEPARTMENT Provider Note  CSN: [id number] Arrival date & time: 08/07/19 2129  Chief Complaint(s) Back Pain  HPI Monica Eaton is a 44 y.o. female with h/o IVDU here for upper back pain   Onset/Duration: 3-4 hours, sudden Timing: constant, fluctuating Quality: aching Severity: moderate to severe Modifying Factors:  Improved by: nothing  Worsened by: movement Associated Signs/Symptoms:  Pertinent (+): paresthesia of the right arm  Pertinent (-): fever, trauma, focal weakness, chest pain, SOB, N/V, abd pain Context: relapsed with heroin 3 days ago. States that she had a herniated disc about C6/C7 on an MRI several years ago and this feels similar. States this also might be a muscle spasm.   HPI  Past Medical History History reviewed. No pertinent past medical history. There are no problems to display for this patient.  Home Medication(s) Prior to Admission medications   Not on File                                                                                                                                    Past Surgical History History reviewed. No pertinent surgical history. Family History History reviewed. No pertinent family history.  Social History Social History   Tobacco Use  . Smoking status: Never Smoker  . Smokeless tobacco: Never Used  Vaping Use  . Vaping Use: Never used  Substance Use Topics  . Alcohol use: Not Currently  . Drug use: Not Currently   Allergies Cefaclor, Cephalosporins, Nitrofurantoin, and Penicillins  Review of Systems Review of Systems All other systems are reviewed and are negative for acute change except as noted in the HPI  Physical Exam Vital Signs  I have reviewed the triage vital signs BP (!) 144/109 (BP Location: Right Arm)   Pulse (!) 112   Temp 98.8 F (37.1 C)   Resp 16   Ht 5' 7.5" (1.715 m)   Wt 59 kg   SpO2 99%   BMI 20.06 kg/m   Physical Exam Vitals  reviewed.  Constitutional:      General: She is not in acute distress.    Appearance: She is well-developed. She is not diaphoretic.  HENT:     Head: Normocephalic and atraumatic.     Nose: Nose normal.  Eyes:     General: No scleral icterus.       Right eye: No discharge.        Left eye: No discharge.     Conjunctiva/sclera: Conjunctivae normal.     Pupils: Pupils are equal, round, and reactive to light.  Cardiovascular:     Rate and Rhythm: Regular rhythm. Tachycardia present.     Heart sounds: No murmur heard.  No friction rub. No gallop.   Pulmonary:     Effort: Pulmonary effort is normal. No respiratory distress.     Breath sounds: Normal breath sounds. No stridor. No rales.  Abdominal:  General: There is no distension.     Palpations: Abdomen is soft.     Tenderness: There is no abdominal tenderness.  Musculoskeletal:     Cervical back: Neck supple. Tenderness and bony tenderness present. No rigidity, spasms or torticollis. Decreased range of motion.     Thoracic back: No tenderness or bony tenderness.     Lumbar back: No tenderness or bony tenderness.       Back:  Skin:    General: Skin is warm and dry.     Findings: No erythema or rash.  Neurological:     Mental Status: She is alert and oriented to person, place, and time.     Comments: Limited due to lack of cooperation Moves all extremities with 5/5 strength Sensation intact     ED Results and Treatments Labs (all labs ordered are listed, but only abnormal results are displayed) Labs Reviewed  CBC WITH DIFFERENTIAL/PLATELET - Abnormal; Notable for the following components:      Result Value   WBC 19.4 (*)    RBC 3.79 (*)    HCT 35.8 (*)    Neutro Abs 16.4 (*)    Monocytes Absolute 1.4 (*)    Abs Immature Granulocytes 0.08 (*)    All other components within normal limits  COMPREHENSIVE METABOLIC PANEL - Abnormal; Notable for the following components:   Glucose, Bld 143 (*)    Total Protein 8.2 (*)     All other components within normal limits  C-REACTIVE PROTEIN - Abnormal; Notable for the following components:   CRP 9.4 (*)    All other components within normal limits  SEDIMENTATION RATE                                                                                                                         EKG  EKG Interpretation  Date/Time:    Ventricular Rate:    PR Interval:    QRS Duration:   QT Interval:    QTC Calculation:   R Axis:     Text Interpretation:        Radiology No results found.  Pertinent labs & imaging results that were available during my care of the patient were reviewed by me and considered in my medical decision making (see chart for details).  Medications Ordered in ED Medications  sodium chloride 0.9 % bolus 1,000 mL (0 mLs Intravenous Stopped 08/08/19 0212)    Followed by  0.9 %  sodium chloride infusion (has no administration in time range)  ketorolac (TORADOL) 15 MG/ML injection 15 mg (15 mg Intravenous Given 08/08/19 0038)  methocarbamol (ROBAXIN) 500 mg in dextrose 5 % 50 mL IVPB (0 mg Intravenous Stopped 08/08/19 0125)  acetaminophen (TYLENOL) tablet 650 mg (650 mg Oral Given 08/08/19 0124)  diazepam (VALIUM) injection 5 mg (5 mg Intravenous Given 08/08/19 0210)  Procedures .Critical Care Performed by: Fatima Blank, MD Authorized by: Fatima Blank, MD    CRITICAL CARE Performed by: Grayce Sessions Bexlee Bergdoll Total critical care time: 40 minutes Critical care time was exclusive of separately billable procedures and treating other patients. Critical care was necessary to treat or prevent imminent or life-threatening deterioration. Critical care was time spent personally by me on the following activities: development of treatment plan with patient and/or surrogate as well as nursing, discussions with  consultants, evaluation of patient's response to treatment, examination of patient, obtaining history from patient or surrogate, ordering and performing treatments and interventions, ordering and review of laboratory studies, ordering and review of radiographic studies, pulse oximetry and re-evaluation of patient's condition.    (including critical care time)  Medical Decision Making / ED Course I have reviewed the nursing notes for this encounter and the patient's prior records (if available in EHR or on provided paperwork).   Monica Eaton was evaluated in Emergency Department on 08/08/2019 for the symptoms described in the history of present illness. She was evaluated in the context of the global COVID-19 pandemic, which necessitated consideration that the patient might be at risk for infection with the SARS-CoV-2 virus that causes COVID-19. Institutional protocols and algorithms that pertain to the evaluation of patients at risk for COVID-19 are in a state of rapid change based on information released by regulatory bodies including the CDC and federal and state organizations. These policies and algorithms were followed during the patient's care in the ED.  Upper back pain Tachycardic with leukocytosis and elevated CRP with h/o IVDU Concern for epidural abscess vs discitis. Also consider herniated disc vs spastic muscle  1:50 AM Will need to obtain MRI to rule out epidural abscess/discitis. No MRI at this time at [facility]. May need to transfer to [facility]. If transport is not here w/i the hour she will stay here to get MRI.   2:23 AM Transport is here. Will send to [facility] for MRI.      Final Clinical Impression(s) / ED Diagnoses Final diagnoses:  Upper back pain  Bandemia  IVDU (intravenous drug user)      This chart was dictated using voice recognition software.  Despite best efforts to proofread,  errors can occur which can change the documentation meaning.   Fatima Blank, MD 08/08/19 [phone]

## 2019-08-08 NOTE — ED Notes (Signed)
Father at bedside.

## 2019-08-08 NOTE — Progress Notes (Signed)
PT moving around prior to MRI. Said shoulders were hurting, placed support under both shoulders to help with pain. More meds were brought for pain. Pt told multiple times prior to scan that we need for her to be still during exam, she acknowledged this. However, during scan she started moving legs and arms, touching her face and around the bore. Spoke to her to bring arms down, continued moving legs around. Unable to continue scan due to motion. RN aware

## 2019-08-08 NOTE — H&P (Signed)
History and Physical  Monica Eaton CHE:[id number] DOB: 1975/10/05 DOA: 08/07/2019  Referring physician: Fatima Blank MD PCP: Patient, No Pcp Per  Patient coming from: Home  Chief Complaint: Back pain  HPI: Monica Eaton is a 44 y.o. female with medical history significant for tobacco abuse and IV drug abuse who presents to the emergency department due to sudden onset of upper back pain which started about 3 -4 hours PTA.  Pain was in posterior neck area and was rated as moderate to severe with no aggravating factor and mild alleviation with Tylenol and Advil.  She denies fever, chills, trauma, chest pain, shortness of breath, nausea, vomiting or abdominal pain.  Patient states that she relapsed with heroin about 3 days ago and that she had history of elevated disc at C6/C7.  ED Course:  In the emergency department, she was tachycardiac but hemodynamically stable.  Work-up in the ED showed leukocytosis and hyperglycemia, CRP 9.4.  MRI cervical spine without contrast showed advanced degenerative disease from C3-4 to C6-7 with cord compression and biforaminal impingement, no evidence of underlying discitis osteomyelitis noted.  C2-3 nonsegmentation and 3 mm of anterolisthesis at C3-4 was also noted.  An attempt for MRI cervical spine with contrast was unsuccessful due to patient not being able to lay still despite IV Dilaudid, Ativan and Valium.  ED physician states that they will coordinate MRI with general anesthesia.  Hospitalist was asked to admit patient for further evaluation and management.  Review of Systems: Constitutional: Negative for chills and fever.  HENT: Negative for ear pain and sore throat.   Eyes: Negative for pain and visual disturbance.  Respiratory: Negative for cough, chest tightness and shortness of breath.   Cardiovascular: Negative for chest pain and palpitations.  Gastrointestinal: Negative for abdominal pain and vomiting.  Endocrine: Negative for  polyphagia and polyuria.  Genitourinary: Negative for decreased urine volume, dysuria, enuresis, hematuria, vaginal discharge and vaginal pain.  Musculoskeletal: Pain in posterior neck area.  Negative for muscle pain Skin: Negative for color change and rash.  Allergic/Immunologic: Negative for immunocompromised state.  Neurological: Negative for tremors, syncope, speech difficulty, weakness, light-headedness and headaches.  Hematological: Does not bruise/bleed easily.  All other systems reviewed and are negative    History reviewed. No pertinent past medical history. History reviewed. No pertinent surgical history.  Social History:  reports that she has never smoked. She has never used smokeless tobacco. She reports previous alcohol use. She reports previous drug use.   Allergies  Allergen Reactions  . Cefaclor   . Cephalosporins   . Nitrofurantoin   . Penicillins     Other reaction(s): Hives    History reviewed. No pertinent family history.    Prior to Admission medications   Not on File    Physical Exam: BP 129/66   Pulse (!) 130   Temp 98.1 F (36.7 C) (Oral)   Resp 15   Ht 5' 7.5" (1.715 m)   Wt 59 kg   SpO2 100%   BMI 20.06 kg/m   . General: 44 y.o. year-old female well developed well nourished in no acute distress.  Alert and oriented x3. Marland Kitchen HEENT: Normocephalic, atraumatic, PERRLA, EOMI . Neck: Supple.  Tender to palpation of posterior neck area. . Cardiovascular: Tachycardia.  Regular rate and rhythm with no rubs or gallops.  No thyromegaly or JVD noted.  No lower extremity edema. 2/4 pulses in all 4 extremities. Marland Kitchen Respiratory: Clear to auscultation with no wheezes or rales. Good inspiratory effort. . Abdomen:  Soft nontender nondistended with normal bowel sounds x4 quadrants. . Muskuloskeletal: Tenderness to palpation of cervical area.  Decreased range of motion of cervical area.  No cyanosis, clubbing or edema noted bilaterally . Neuro: CN II-XII intact,  strength, sensation, reflexes . Skin: No ulcerative lesions noted or rashes . Psychiatry: Judgement and insight appear normal. Mood is appropriate for condition and setting          Labs on Admission:  Basic Metabolic Panel: Recent Labs  Lab 08/08/19 0007  NA 136  K 3.5  CL 99  CO2 28  GLUCOSE 143*  BUN 11  CREATININE 0.49  CALCIUM 9.0   Liver Function Tests: Recent Labs  Lab 08/08/19 0007  AST 30  ALT 22  ALKPHOS 89  BILITOT 0.8  PROT 8.2*  ALBUMIN 4.5   No results for input(s): LIPASE, AMYLASE in the last 168 hours. No results for input(s): AMMONIA in the last 168 hours. CBC: Recent Labs  Lab 08/08/19 0007  WBC 19.4*  NEUTROABS 16.4*  HGB 12.0  HCT 35.8*  MCV 94.5  PLT 347   Cardiac Enzymes: No results for input(s): CKTOTAL, CKMB, CKMBINDEX, TROPONINI in the last 168 hours.  BNP (last 3 results) No results for input(s): BNP in the last 8760 hours.  ProBNP (last 3 results) No results for input(s): PROBNP in the last 8760 hours.  CBG: No results for input(s): GLUCAP in the last 168 hours.  Radiological Exams on Admission: MR CERVICAL SPINE WO CONTRAST  Result Date: 08/08/2019 CLINICAL DATA:  Evaluate for epidural abscess. EXAM: MRI CERVICAL SPINE WITHOUT CONTRAST TECHNIQUE: Multiplanar, multisequence MR imaging of the cervical spine was performed. No intravenous contrast was administered. COMPARISON:  None. FINDINGS: Alignment: Reversal of cervical lordosis that is degenerative. 3 mm of anterolisthesis at C3-4 Vertebrae: Sclerotic appearance at the C5 and C6 vertebral bodies attributed to degenerative disease. No clear edematous marrow on the very motion degraded STIR images. Cord: Degenerative compression at C3-4 to C6-7. No definitive cord signal abnormality Posterior Fossa, vertebral arteries, paraspinal tissues: Prevertebral T2 hyperintensity. Disc levels: C2-3: Non segmentation C3-4: Advanced facet osteoarthritis with anterolisthesis, spinal stenosis,  and biforaminal impingement. C4-5: Disc narrowing with endplate and especially uncovertebral ridging. Spinal stenosis and biforaminal impingement C5-6: Disc narrowing and endplate degeneration with circumferential bulging and endplate/uncovertebral ridging. Greatest level of cord compression and severe biforaminal impingement C6-7: Disc narrowing and endplate degeneration with circumferential bulging and endplate/uncovertebral ridging. Cord compression and biforaminal impingement C7-T1:Unremarkable. IMPRESSION: 1. Truncated and motion degraded study without contrast. 2. 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 3. Prevertebral fluid intensity of uncertain significance. No evidence of underlying discitis or osteomyelitis. 4. C2-3 non segmentation.  3 mm of anterolisthesis at C3-4. Electronically Signed   By: Monte Fantasia M.D.   On: 08/08/2019 04:39    EKG: I independently viewed the EKG done and my findings are as followed: EKG was not done in the ED.  Assessment/Plan Present on Admission: **None**  Principal Problem:   Cervical pain (neck) Active Problems:   IV drug abuse (HCC)   Tobacco abuse   Cervical pain POA Patient presents with posterior neck pain that started a few hours prior to arrival to the ED MRI cervical spine without contrast showed advanced degenerative disease from C3-4 to C6-7 with cord compression and biforaminal impingement, no evidence of underlying discitis osteomyelitis noted.   Due to concern for epidural abscess vs discitis vs Herniated disc vs muscle spasm, but patient  was uncooperative with MRI thoracic and lumbar spine with and without contrast.  ED physician states he will coordinate MRI with general anesthesia to be taken later in the day Continue Tylenol 650 mg every 6 hours as needed for mild pain Continue oxycodone 5 mg p.o. every 4 hours as needed for moderate pain Empiric IV vancomycin was started  with plan to de-escalate based on blood culture and above imaging studies. Consider TTE based on blood culture findings  IV drug abuse Urine drug screen will be done Patient will be counseled when more calm  Tobacco abuse Patient will be counseled on tobacco abuse cessation when more calm  DVT prophylaxis: Venous, SCDs  Code Status: Full code  Family Communication: None at bedside  Disposition Plan:  Patient is from:                        home Anticipated DC to:                   Family members home Anticipated DC date:                24 hours Anticipated DC barriers:          Findings on pending imaging studies and blood culture findings   Consults called: None  Admission status: Observation    Bernadette Hoit MD [facility]  If 7PM-7AM, please contact night-coverage [email or web address]  08/08/2019, 6:39 AM

## 2019-08-08 NOTE — Brief Op Note (Signed)
08/08/2019  11:52 PM  PATIENT:  Tereasa Coop  44 y.o. female  PRE-OPERATIVE DIAGNOSIS:  EPIDURAL ABSCESS  POST-OPERATIVE DIAGNOSIS:  EPIDURAL ABSCESS  PROCEDURE:  Procedure(s): POSTERIOR Cervical three - Cervical six LAMINECTOMY AND  FUSION (N/A)  SURGEON:  Surgeon(s) and Role:    * Brycelyn Gambino, Joyice Faster, MD - Primary    * Kristeen Miss, MD - Assisting  PHYSICIAN ASSISTANT:   ANESTHESIA:   general  EBL:  150 mL   BLOOD ADMINISTERED:none  DRAINS: none   LOCAL MEDICATIONS USED:  LIDOCAINE   SPECIMEN:  No Specimen  DISPOSITION OF SPECIMEN:  N/A  COUNTS:  YES  TOURNIQUET:  * No tourniquets in log *  DICTATION: .Note written in EPIC  PLAN OF CARE: Admit to inpatient   PATIENT DISPOSITION:  PACU - hemodynamically stable.   Delay start of Pharmacological VTE agent (>24hrs) due to surgical blood loss or risk of bleeding: yes

## 2019-08-08 NOTE — Progress Notes (Signed)
Patient ID: Monica Eaton, female   DOB: 1975/12/12, 44 y.o.   MRN: [id number] Patient admitted early this morning for cervical neck pain with MRI of cervical spine without contrast showing advanced degenerative disease with question of cord compression.  She was transferred from [place] [facility] to [facility] to have MRI with contrast under general anesthesia to rule out epidural abscess/discitis/osteomyelitis.  She has been empirically started on vancomycin and cefepime.  Patient seen and examined at bedside.  She is pacing around the room complaining of severe pain in her neck and does not engage in conversation much.  Spoke to father at bedside.  I have reviewed patient's medical records including this morning's H&P, current vitals, labs, medications myself.  Follow cultures.  I have spoken to Dr. Ostergard/neurosurgery on phone myself and will wait for his evaluation and recommendations.  Check urine drug screen.  Patient will need at least more than 2 midnights in the hospital because of complexity of her condition with need for IV antibiotics versus neurosurgical intervention.  Will also get CT angiogram of the neck to rule out dissection or other vascular injuries.

## 2019-08-08 NOTE — Progress Notes (Signed)
Pt transport here to take pt to OR; report called, CN accompanying pt down at this time.

## 2019-08-08 NOTE — Progress Notes (Signed)
PT brought back for another MRI attempt but still unable to lay still. Pt stating she has shoulder pain and unable to lay in bed. Nurse aware, tried to talk to pt about attempting but she said she could not. Pt taken back to room

## 2019-08-08 NOTE — ED Notes (Signed)
Pt unable to sit still enough long enough at this time to get vitals, or set up cardiac monitoring. Will reassess pt in 10 mins to see if pt is able to sit still enough.

## 2019-08-08 NOTE — ED Notes (Signed)
Pt unhooked herself and ambulated to the restroom on her own.

## 2019-08-08 NOTE — Anesthesia Procedure Notes (Signed)
Procedure Name: Intubation Date/Time: 08/08/2019 9:12 PM Performed by: Valetta Fuller, CRNA Pre-anesthesia Checklist: Patient identified, Emergency Drugs available, Suction available and Patient being monitored Patient Re-evaluated:Patient Re-evaluated prior to induction Oxygen Delivery Method: Circle system utilized Preoxygenation: Pre-oxygenation with 100% oxygen Induction Type: IV induction, Rapid sequence and Cricoid Pressure applied Laryngoscope Size: Miller and 2 Grade View: Grade I Tube type: Oral Tube size: 7.5 mm Number of attempts: 1 Airway Equipment and Method: Stylet Placement Confirmation: ETT inserted through vocal cords under direct vision,  positive ETCO2 and breath sounds checked- equal and bilateral Secured at: 22 cm Tube secured with: Tape Dental Injury: Teeth and Oropharynx as per pre-operative assessment

## 2019-08-08 NOTE — ED Notes (Signed)
Pt transported to MRI 

## 2019-08-08 NOTE — Consult Note (Signed)
Neurosurgery Consultation  Reason for Consult: Neck pain / cervical stenosis Referring Physician: Starla Link  CC: Neck pain  HPI: This is a 44 y.o. woman w/ h/o IVDU that presents with acute onset severe neck pain, no h/o trauma. She is very agitated, getting in and out of bed, unable to answer questions due to pain, so history is unfortunately limited. From what I could gather, the pain is worse in the neck, but she does have some radiation into the arms more than legs with some burning dysesthesias, she can't tell if she has any numbness or subjective weakness, but does endorse allodynia. No recent change in bowel or bladder function. No recent use of anti-platelet or anti-coagulant medications.   ROS: A 14 point ROS was performed and is negative except as noted in the HPI.   PMHx: History reviewed. No pertinent past medical history. FamHx: History reviewed. No pertinent family history. SocHx:  reports that she has never smoked. She has never used smokeless tobacco. She reports previous alcohol use. She reports previous drug use.  Exam: Vital signs in last 24 hours: Temp:  [98.1 F (36.7 C)-98.8 F (37.1 C)] 98.1 F (36.7 C) (07/02 0243) Pulse Rate:  [89-130] 130 (07/02 0516) Resp:  [15-24] 15 (07/02 0500) BP: (108-144)/(66-109) 129/66 (07/02 0516) SpO2:  [98 %-100 %] 100 % (07/02 0516) Weight:  [59 kg] 59 kg (07/01 2205) General: Visibly in severe pain, can't find a comfortable position in bed Head: Normocephalic and atruamatic HEENT: Cervical musculature with inc'd tone but negative brudzinski Pulmonary: breathing room air comfortably, no evidence of increased work of breathing Psych: agitated, oriented but poor historian Abdomen: S NT ND Extremities: Warm and well perfused x4 but sensitive to touch with dysesthesias Neuro: Difficult exam due to agitation, oriented to person / place / year, pupils equal, gaze neutral, face grossly symmetric FCx4 without clear preference but unable  to perform strength testing by muscle group, pt unable to relax or sit still long enough to perform reflex testing / evaluation of Hoffman's / clonus / etc   Assessment and Plan: 44 y.o. woman w/ h/o IVDU with acute onset severe neck pain with some dysesthesias / BUE > BLE pain. MRI C-spine w/o contrast personally reviewed, study was aborted early due to inability to complete study, only sagittal images available and therefore not truly diagnostic. From available images, there is ankylosis of C2-3, C3-4 anterolisthesis with severe canal stenosis from C3-4 to C6-7, some increased T2 signal in the prevertebral space, some signal abnormality in the vertebral bodies that is worst at C5 but not from a clear cause - degenerative versus inflammatory.  -MRI w/wo pending to evaluate for discitis / abscess, agree with that plan -will re-evaluate after MRI is completed and see if I can get a better neurologic exam -please call with any concerns or questions  [name], MD 08/08/19 9:19 AM [facility]

## 2019-08-08 NOTE — ED Notes (Addendum)
Pt returned from MRI. Currently pacing room. Refusing to lay down in bed. Refusing RN to connect to monitoring system and start IV fluids

## 2019-08-08 NOTE — ED Provider Notes (Signed)
Patient transferred here to further evaluate upper back and neck pain.  Patient is a known IV drug user and is at significant risk for infection.  She was transferred here from [place] to undergo MRI to further evaluate.  Unfortunately, patient has been unable to lie flat to undergo this MRI.  She did have a partial, noncontrast cervical spine MRI that did show prevertebral fluid collection.  It is unclear what the etiology of this as there is not an obvious underlying discitis or osteomyelitis.  She will, however, require contrasted MRI to further evaluate this region as well as the rest of her spine.  As this cannot be performed in the ED, will admit the patient for MRI under general anesthesia.  Empiric antibiotics initiated.   Orpah Greek, MD 08/08/19 [phone]

## 2019-08-08 NOTE — Transfer of Care (Signed)
Immediate Anesthesia Transfer of Care Note  Patient: Monica Eaton  Procedure(s) Performed: MRI WITH ANESTHESIA (N/A )  Patient Location: PACU  Anesthesia Type:General  Level of Consciousness: awake, alert  and oriented  Airway & Oxygen Therapy: Patient Spontanous Breathing  Post-op Assessment: Report given to RN and Post -op Vital signs reviewed and stable  Post vital signs: Reviewed and stable  Last Vitals:  Vitals Value Taken Time  BP 129/86 08/08/19 1631  Temp    Pulse 101 08/08/19 1635  Resp 24 08/08/19 1635  SpO2 98 % 08/08/19 1635  Vitals shown include unvalidated device data.  Last Pain:  Vitals:   08/08/19 0749  TempSrc:   PainSc: 10-Worst pain ever      Patients Stated Pain Goal: 0 (37/62/83 1517)  Complications: No complications documented.

## 2019-08-08 NOTE — ED Notes (Signed)
Given remaining dose of 2 mg of dilaudid and 1 mg of ativan to pt while at MRI

## 2019-08-08 NOTE — Anesthesia Preprocedure Evaluation (Addendum)
Anesthesia Evaluation  Patient identified by MRN, date of birth, ID bandGeneral Assessment Comment:Patient intermittently responsive to verbal stimulus    Reviewed: Allergy & Precautions, Patient's Chart, lab work & pertinent test resultsPreop documentation limited or incomplete due to emergent nature of procedure.  Airway Mallampati: III  TM Distance: >3 FB Neck ROM: Full    Dental no notable dental hx.    Pulmonary Current SmokerPatient did not abstain from smoking.,    Pulmonary exam normal breath sounds clear to auscultation       Cardiovascular negative cardio ROS Normal cardiovascular exam Rhythm:Regular Rate:Normal     Neuro/Psych Neck pain negative psych ROS   GI/Hepatic negative GI ROS, (+)     substance abuse  IV drug use,   Endo/Other  negative endocrine ROS  Renal/GU negative Renal ROS     Musculoskeletal negative musculoskeletal ROS (+)   Abdominal   Peds  Hematology  (+) anemia ,   Anesthesia Other Findings EPIDURAL ABSCESS  Reproductive/Obstetrics hcg negative                            Anesthesia Physical Anesthesia Plan  ASA: III and emergent  Anesthesia Plan: General   Post-op Pain Management:    Induction: Intravenous  PONV Risk Score and Plan: 3 and Ondansetron, Dexamethasone and Treatment may vary due to age or medical condition  Airway Management Planned: Oral ETT  Additional Equipment:   Intra-op Plan:   Post-operative Plan: Extubation in OR and Possible Post-op intubation/ventilation  Informed Consent: I have reviewed the patients History and Physical, chart, labs and discussed the procedure including the risks, benefits and alternatives for the proposed anesthesia with the patient or authorized representative who has indicated his/her understanding and acceptance.     Only emergency history available  Plan Discussed with: CRNA  Anesthesia Plan  Comments: (Emergency consent signed due to decreased patient mental status)       Anesthesia Quick Evaluation

## 2019-08-08 NOTE — Progress Notes (Signed)
New Admission Note:  Arrival Method: pt arrived on unit via stretcher from PACU transported by PACU RN Mental Orientation: pt alert and oriented Telemetry: pt on telemetry Assessment: Completed Skin: pt has abrasions generalized all over her body-arms, legs, feet and face IV: left AC infusing LR @125mL /hr Pain: c/o pain in her neck with no relief, MD aware Safety Measures: Safety Fall Prevention Plan was given, discussed. Admission: Completed 3W: Patient has been orientated to the room, unit and the staff. Family: pt father is at bedside and is pleasant and supportive  Orders have been reviewed and implemented. Will continue to monitor the patient. Call light has been placed within reach and bed alarm has been activated.   Janus Molder, RN

## 2019-08-08 NOTE — ED Notes (Signed)
Pt walked out of the room. Gait stable requesting something for pain.

## 2019-08-08 NOTE — ED Notes (Signed)
Pt continues with nonstop movements. Unable to hold still or stay in the bed. Unable to connect pt to IV abx as pt is a hard IV stick.

## 2019-08-08 NOTE — ED Notes (Signed)
Ice pack given for neck pain. Pacing continues. IV secured with coban

## 2019-08-08 NOTE — ED Notes (Signed)
Patient ambulating at bedside. Walking around and squatting beside bed.

## 2019-08-08 NOTE — ED Notes (Signed)
Prior to departure for scan, pt sleeping and comfortable. EDP at bedside noted pt stopped twitching when awake. Discussed need for MRI. Pt agreed to be cooperative. Attempted to scan pt again.   MRI tech called RN to inform her pt unable to lay still. Twitching returned. Pt noncompliant with scan and not following commands. RN to MRI to attempt to convince pt. Informed pt MRI determinate factor for diagnosis of neck pain. Pt states she cannot lay still.   Pt returned to room. EDP informed.

## 2019-08-08 NOTE — Anesthesia Preprocedure Evaluation (Addendum)
Anesthesia Evaluation  Patient identified by MRN, date of birth, ID band Patient awake    Reviewed: Allergy & Precautions, NPO status , Patient's Chart, lab work & pertinent test results  Airway Mallampati: I  TM Distance: >3 FB Neck ROM: Limited    Dental  (+) Poor Dentition, Dental Advisory Given   Pulmonary neg pulmonary ROS,    breath sounds clear to auscultation       Cardiovascular negative cardio ROS   Rhythm:Regular Rate:Tachycardia     Neuro/Psych negative neurological ROS     GI/Hepatic negative GI ROS,   Endo/Other    Renal/GU      Musculoskeletal   Abdominal Normal abdominal exam  (+)   Peds  Hematology   Anesthesia Other Findings Severe neck pain.  Elevated temp, cannot rule out UTI vs epidural abscess as source.   Reproductive/Obstetrics                             Anesthesia Physical Anesthesia Plan  ASA: III  Anesthesia Plan: General   Post-op Pain Management:    Induction: Intravenous  PONV Risk Score and Plan: 3 and Ondansetron, Midazolam and Treatment may vary due to age or medical condition  Airway Management Planned: LMA and Oral ETT  Additional Equipment: None  Intra-op Plan:   Post-operative Plan: Extubation in OR  Informed Consent: I have reviewed the patients History and Physical, chart, labs and discussed the procedure including the risks, benefits and alternatives for the proposed anesthesia with the patient or authorized representative who has indicated his/her understanding and acceptance.     Dental advisory given  Plan Discussed with: CRNA  Anesthesia Plan Comments: (Communication limited due to pain meds administered in the ED. Pt states she understands the risk and would like to proceed.   Will administer acetaminophen for elevated temperature. Will proceed with MRI to rule in or out epidural abscess for antibiotic adjustment. )         Anesthesia Quick Evaluation

## 2019-08-08 NOTE — ED Notes (Signed)
Pt remains restless. Given ice pack. Family remains at the bedside.

## 2019-08-08 NOTE — Op Note (Addendum)
PATIENT: Monica Eaton  DAY OF SURGERY: 08/08/19   PRE-OPERATIVE DIAGNOSIS:  Cervical epidural abscess, cervical stenosis   POST-OPERATIVE DIAGNOSIS:  Same   PROCEDURE:  Posterior C3-C7 laminectomies, C3-C6 instrumented fusion   SURGEON:  Surgeon(s) and Role:    [name], MD - Primary    Kristeen Miss, MD - Assistant   ANESTHESIA: ETGA   BRIEF HISTORY: This is a 44 year old woman who presented with severe neck pain. She had a non-diagnostic MRI without contrast that was concerning for possible infection with canal stenosis. She had a repeat MRI w/wo contrast that confirmed sequelae of infection as well as severe canal stenosis with signal change. Upon re-evaluation, she was significantly weaker than my prior exam with signs of myelopathy. I therefore recommended emergent decompression with stabilization and evacuation of any accessible infectious material.    OPERATIVE DETAIL: The patient was taken to the operating room and anesthesia was induced by the anesthesia team. The [place] head holder was applied and she was placed on the OR table in the prone position with chest rolls and padding of all pressure points. A formal time out was performed with two patient identifiers and confirmed the operative site. The operative site was marked, hair was clipped with surgical clippers, the area was then prepped and draped in a sterile fashion. Fluoro was used to localize the operative level and a midline incision was placed to expose from C3 to C7. Subperiosteal dissection was performed bilaterally and fluoroscopy was again used to confirm the surgical level.   Decompression was performed, which consisted of laminectomies at C3, C4, C5, C6, and the superior portion of C7. This was performed using a combination of rongeurs and a high speed drill. Decompression was continued until there was no palpable stenosis present. There was no clear purulent material present, so no cultures were taken.    Instrumentation was then performed. Using standard landmarks, lateral mass screws were placed at C3, C4, C5, and C6 bilaterally. These were placed by dirlling a pilot hole, using a hand drill to drill the trajectory, feeling with a pedicle feeler, tapping, feeling with a pedicle feeler, and then placing the screw. On the left at C3, the lateral mass was small and the screw had very poor pull-out strength when tested, so I removed it and the construct was therefore C3-C6 on the right and C4-C6 on the left. These were connected with rods bilaterally and final tightened according to manufacturer torque specifications. The bone was thoroughly decorticated over the fusion surface bilaterally from C3-C6 and the previously resected bone fragments were morselized and used as autograft.   All instrument and sponge counts were correct, the incision was then closed in layers. The patient was then returned to anesthesia for emergence. No apparent complications at the completion of the procedure.   EBL:  115mL   DRAINS: none   SPECIMENS: none   [name], MD 08/08/19 7:40 PM

## 2019-08-08 NOTE — Progress Notes (Signed)
MRI completed, pt seen & re-evaluated, able to get a better exam now. She feels substantially weaker than she was this morning when I saw her walking around the room. On exam, she has sustained clonus and Hoffman's with diffuse weakness 4-/5 in the BUE, BLE are 4-/5 except 3/5 at the hip flexors. MRI shows some subtle cord signal change and severe stenosis with likely discitis at C5-6 and epidural enhancement, likely due to infection in the setting of more chronic degenerative disease / stenosis.  -OR now for posterior decompression and instrumented fusion, discussed with patient

## 2019-08-08 NOTE — ED Notes (Addendum)
Pt placed back in the bed and informed she would need to stay in the bed. Pt voiced understanding.

## 2019-08-08 NOTE — Progress Notes (Signed)
Pharmacy Antibiotic Note  Monica Eaton is a 44 y.o. female admitted on 08/07/2019 with back pain, possible osteomyelitis or epidural abcess.  Pharmacy has been consulted for Vancomycin and Levaquin dosing.  Plan: Vancomycin 1250 mg IV now, then 1 g IV q12h Levaquin 750 mg IV q24h  Height: 5' 7.5" (171.5 cm) Weight: 59 kg (130 lb) IBW/kg (Calculated) : 62.75  Temp (24hrs), Avg:98.5 F (36.9 C), Min:98.1 F (36.7 C), Max:98.8 F (37.1 C)  Recent Labs  Lab 08/08/19 0007  WBC 19.4*  CREATININE 0.49    Estimated Creatinine Clearance: 85.3 mL/min (by C-G formula based on SCr of 0.49 mg/dL).    Allergies  Allergen Reactions  . Cefaclor   . Cephalosporins   . Nitrofurantoin   . Penicillins     Other reaction(s): Hives    Monica Eaton 08/08/2019 6:34 AM

## 2019-08-08 NOTE — Anesthesia Procedure Notes (Signed)
Procedure Name: LMA Insertion Date/Time: 08/08/2019 2:18 PM Performed by: Lowella Dell, CRNA Pre-anesthesia Checklist: Patient identified, Emergency Drugs available, Suction available and Patient being monitored Patient Re-evaluated:Patient Re-evaluated prior to induction Oxygen Delivery Method: Circle System Utilized Preoxygenation: Pre-oxygenation with 100% oxygen Induction Type: IV induction Ventilation: Mask ventilation without difficulty LMA: LMA inserted LMA Size: 4.0 Number of attempts: 1 Placement Confirmation: positive ETCO2 Tube secured with: Tape Dental Injury: Teeth and Oropharynx as per pre-operative assessment

## 2019-08-08 NOTE — ED Notes (Signed)
Pt arrived by [facility] from [facility] as transfer for MRI to rule out epidural abscess. Hx of IV drug use. Received tylenol, IV robaxin, toradol, and valium pta. Pt ambulating with steady gait.

## 2019-08-08 NOTE — ED Notes (Signed)
Attempted to call family for support per pt request, no answer.

## 2019-08-09 ENCOUNTER — Inpatient Hospital Stay (HOSPITAL_COMMUNITY): Payer: Medicaid Other

## 2019-08-09 DIAGNOSIS — D72829 Elevated white blood cell count, unspecified: Secondary | ICD-10-CM

## 2019-08-09 DIAGNOSIS — M542 Cervicalgia: Secondary | ICD-10-CM

## 2019-08-09 DIAGNOSIS — F1910 Other psychoactive substance abuse, uncomplicated: Secondary | ICD-10-CM

## 2019-08-09 DIAGNOSIS — R7881 Bacteremia: Secondary | ICD-10-CM

## 2019-08-09 DIAGNOSIS — Z720 Tobacco use: Secondary | ICD-10-CM

## 2019-08-09 DIAGNOSIS — B9561 Methicillin susceptible Staphylococcus aureus infection as the cause of diseases classified elsewhere: Secondary | ICD-10-CM

## 2019-08-09 LAB — RAPID URINE DRUG SCREEN, HOSP PERFORMED
Amphetamines: NOT DETECTED
Barbiturates: NOT DETECTED
Benzodiazepines: POSITIVE — AB
Cocaine: NOT DETECTED
Opiates: POSITIVE — AB
Tetrahydrocannabinol: POSITIVE — AB

## 2019-08-09 LAB — BLOOD CULTURE ID PANEL (REFLEXED)

## 2019-08-09 LAB — PROTIME-INR
INR: 1.2 (ref 0.8–1.2)
Prothrombin Time: 14.2 seconds (ref 11.4–15.2)

## 2019-08-09 LAB — COMPREHENSIVE METABOLIC PANEL
ALT: 16 U/L (ref 0–44)
AST: 18 U/L (ref 15–41)
Albumin: 2.8 g/dL — ABNORMAL LOW (ref 3.5–5.0)
Alkaline Phosphatase: 62 U/L (ref 38–126)
Anion gap: 10 (ref 5–15)
BUN: 6 mg/dL (ref 6–20)
CO2: 26 mmol/L (ref 22–32)
Calcium: 8.8 mg/dL — ABNORMAL LOW (ref 8.9–10.3)
Chloride: 100 mmol/L (ref 98–111)
Creatinine, Ser: 0.54 mg/dL (ref 0.44–1.00)
GFR calc Af Amer: >60 mL/min (ref >60)
GFR calc non Af Amer: >60 mL/min (ref >60)
Glucose, Bld: 141 mg/dL — ABNORMAL HIGH (ref 70–99)
Potassium: 3.8 mmol/L (ref 3.5–5.1)
Sodium: 136 mmol/L (ref 135–145)
Total Bilirubin: 0.8 mg/dL (ref 0.3–1.2)
Total Protein: 6.2 g/dL — ABNORMAL LOW (ref 6.5–8.1)

## 2019-08-09 LAB — PREGNANCY, URINE: Preg Test, Ur: NEGATIVE

## 2019-08-09 LAB — CBC
HCT: 32.6 % — ABNORMAL LOW (ref 36.0–46.0)
Hemoglobin: 10.6 g/dL — ABNORMAL LOW (ref 12.0–15.0)
MCH: 31.3 pg (ref 26.0–34.0)
MCHC: 32.5 g/dL (ref 30.0–36.0)
MCV: 96.2 fL (ref 80.0–100.0)
Platelets: 296 10*3/uL (ref 150–400)
RBC: 3.39 MIL/uL — ABNORMAL LOW (ref 3.87–5.11)
RDW: 13.2 % (ref 11.5–15.5)
WBC: 18.6 10*3/uL — ABNORMAL HIGH (ref 4.0–10.5)
nRBC: 0 % (ref 0.0–0.2)

## 2019-08-09 LAB — MAGNESIUM: Magnesium: 1.8 mg/dL (ref 1.7–2.4)

## 2019-08-09 LAB — APTT: aPTT: 34 seconds (ref 24–36)

## 2019-08-09 LAB — ECHOCARDIOGRAM COMPLETE
Height: 67 in
Weight: 2158.74 oz

## 2019-08-09 LAB — PHOSPHORUS: Phosphorus: 3.6 mg/dL (ref 2.5–4.6)

## 2019-08-09 LAB — C-REACTIVE PROTEIN: CRP: 28.9 mg/dL — ABNORMAL HIGH (ref <1.0)

## 2019-08-09 MED ORDER — NICOTINE 14 MG/24HR TD PT24
14.0000 mg | MEDICATED_PATCH | Freq: Every day | TRANSDERMAL | Status: DC
  Administered 2019-08-09 – 2019-09-08 (×31): 14 mg via TRANSDERMAL
  Filled 2019-08-09 (×31): qty 1

## 2019-08-09 MED ORDER — SODIUM CHLORIDE 0.9 % IV SOLN
1000.0000 mL | INTRAVENOUS | Status: DC
  Administered 2019-08-09 – 2019-08-10 (×2): 1000 mL via INTRAVENOUS

## 2019-08-09 MED ORDER — HYDROMORPHONE HCL 1 MG/ML IJ SOLN
1.0000 mg | Freq: Once | INTRAMUSCULAR | Status: AC
  Administered 2019-08-09: 1 mg via INTRAVENOUS
  Filled 2019-08-09: qty 1

## 2019-08-09 MED ORDER — CHLORHEXIDINE GLUCONATE CLOTH 2 % EX PADS
6.0000 | MEDICATED_PAD | Freq: Every day | CUTANEOUS | Status: DC
  Administered 2019-08-09 – 2019-09-03 (×24): 6 via TOPICAL

## 2019-08-09 NOTE — Anesthesia Postprocedure Evaluation (Signed)
Anesthesia Post Note  Patient: Monica Eaton  Procedure(s) Performed: POSTERIOR Cervical three - Cervical six LAMINECTOMY AND  FUSION (N/A Neck)     Patient location during evaluation: PACU Anesthesia Type: General Level of consciousness: awake Pain management: pain level controlled Vital Signs Assessment: post-procedure vital signs reviewed and stable Respiratory status: spontaneous breathing, nonlabored ventilation, respiratory function stable and patient connected to nasal cannula oxygen Cardiovascular status: blood pressure returned to baseline and stable Postop Assessment: no apparent nausea or vomiting Anesthetic complications: no   No complications documented.  Last Vitals:  Vitals:   08/09/19 0100 08/09/19 0119  BP:  109/73  Pulse: 91 91  Resp: 20 16  Temp:  37.1 C  SpO2: 97%     Last Pain:  Vitals:   08/09/19 0119  TempSrc: Oral  PainSc:                  [name]

## 2019-08-09 NOTE — Consult Note (Signed)
Date of Admission:  08/07/2019          Reason for Consult: MSSA bacteremia and epidural abscess   Referring Provider: Terrilyn Saver "auto consult"   Assessment:  1. MSSA bacteremia 2. 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 IV drug use 4. Tobacco use  Plan:  1. Continue cefazolin 2. Blood cultures 3. TTE and will need TEE (unless not safe given C spine infection) 4. Monitor for metastatic sites of infection 5. She should ideally need 6-8 weeks of IV abx followed by protracted oral abx given hardware presnt 6. Check HBV, HCV,HAV 7. ESR, CRP  Principal Problem:   Cervical pain (neck) Active Problems:   IV drug abuse (HCC)   Tobacco abuse   Neck pain   Scheduled Meds: . Chlorhexidine Gluconate Cloth  6 each Topical Daily  . cloNIDine  0.1 mg Oral QID   Followed by  . [START ON 08/11/2019] cloNIDine  0.1 mg Oral BH-qamhs   Followed by  . [START ON 08/13/2019] cloNIDine  0.1 mg Oral QAC breakfast  . enoxaparin (LOVENOX) injection  40 mg Subcutaneous Q24H   Continuous Infusions: . sodium chloride 1,000 mL (08/09/19 1014)  . methocarbamol (ROBAXIN) IV    . vancomycin 1,000 mg (08/09/19 0528)   PRN Meds:.acetaminophen **OR** acetaminophen, dicyclomine, HYDROmorphone (DILAUDID) injection, hydrOXYzine, loperamide, methocarbamol (ROBAXIN) IV, methocarbamol, naproxen, ondansetron, oxyCODONE  HPI: Monica Eaton is a 44 y.o. female with history of injection drug use who was admitted to the hospital with severe neck and back pain.  Patient had an MRI of CT and L-spine.  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  Patient was taken to the operating room by Dr. Zada Finders and underwent Posterior C3-C7 laminectomies, C3-C6 instrumented fusion. Frank purulence was not encountered in the OR and cultures were not sent.  Blood cultures have been taken on admission  and these have subsequently turned positive for methicillin sensitive staph coccus aureus.  We will narrow to cefazolin repeat blood cultures and I have ordered a 2D echocardiogram.  She is also complaining of pain in her head and we may need to get MRI of the head though at present I do not know if she can tolerate this very well.  Ideally she will need 6 to 8 weeks of parenteral antibiotics.  I will add rifampin once we have proven clearance of her bacteremia.  I would then give her protracted oral antibiotics with Keflex and rifampin.  She needs long-term treatment for her IV drug abuse.  I will check hepatitis C and B serologies.       Review of Systems: Review of Systems  Constitutional: Positive for fever and malaise/fatigue. Negative for chills, diaphoresis and weight loss.  HENT: Negative for congestion, hearing loss, sore throat and tinnitus.   Eyes: Negative for blurred vision and double vision.  Respiratory: Negative for cough, sputum production, shortness of breath and wheezing.   Cardiovascular: Negative for chest pain, palpitations and leg swelling.  Gastrointestinal: Negative for abdominal pain, blood in stool, constipation, diarrhea, heartburn, melena, nausea and vomiting.  Genitourinary: Negative for dysuria, flank pain and hematuria.  Musculoskeletal: Positive for back pain, myalgias and neck pain. Negative for falls and joint pain.  Skin: Positive for rash. Negative for itching.  Neurological: Negative for dizziness, sensory change, focal weakness, loss of consciousness, weakness and headaches.  Endo/Heme/Allergies: Does not bruise/bleed easily.  Psychiatric/Behavioral: Positive for depression  and substance abuse. Negative for memory loss and suicidal ideas. The patient is not nervous/anxious.     History reviewed. No pertinent past medical history.  Social History   Tobacco Use  . Smoking status: Never Smoker  . Smokeless tobacco: Never Used  Vaping Use  .  Vaping Use: Never used  Substance Use Topics  . Alcohol use: Not Currently  . Drug use: Not Currently    History reviewed. No pertinent family history. Allergies  Allergen Reactions  . Cefaclor   . Cephalosporins   . Nitrofurantoin   . Penicillins     Other reaction(s): Hives    OBJECTIVE: Blood pressure 122/74, pulse 77, temperature 98.8 F (37.1 C), temperature source Oral, resp. rate 20, height '5\' 7"'  (1.702 m), weight 61.2 kg, SpO2 100 %.  Physical Exam Constitutional:      Appearance: She is underweight. She is not diaphoretic.  HENT:     Head: Normocephalic and atraumatic.     Right Ear: External ear normal.     Left Ear: External ear normal.     Nose: Nose normal.     Mouth/Throat:     Pharynx: No oropharyngeal exudate.  Eyes:     General: No scleral icterus.    Extraocular Movements: Extraocular movements intact.     Conjunctiva/sclera: Conjunctivae normal.  Cardiovascular:     Rate and Rhythm: Normal rate and regular rhythm.     Heart sounds: Normal heart sounds. No murmur heard.  No friction rub. No gallop.   Pulmonary:     Effort: Pulmonary effort is normal. No respiratory distress.     Breath sounds: Normal breath sounds. No stridor. No wheezing or rales.  Abdominal:     General: Bowel sounds are normal. There is no distension.     Palpations: Abdomen is soft.     Tenderness: There is no abdominal tenderness. There is no rebound.  Musculoskeletal:        General: No tenderness. Normal range of motion.     Cervical back: Normal range of motion and neck supple.  Lymphadenopathy:     Cervical: No cervical adenopathy.  Skin:    General: Skin is warm and dry.     Coloration: Skin is not pale.     Findings: No erythema or rash.  Neurological:     General: No focal deficit present.     Mental Status: She is alert and oriented to person, place, and time.     Coordination: Coordination normal.  Psychiatric:        Attention and Perception: Attention  normal.        Mood and Affect: Mood is anxious and depressed. Affect is labile.        Speech: Speech normal.        Behavior: Behavior is agitated. Behavior is cooperative.        Thought Content: Thought content normal.        Cognition and Memory: Cognition normal.        Judgment: Judgment normal.    Multiple areas of excoriation  Lab Results Lab Results  Component Value Date   WBC 18.6 (H) 08/09/2019   HGB 10.6 (L) 08/09/2019   HCT 32.6 (L) 08/09/2019   MCV 96.2 08/09/2019   PLT 296 08/09/2019    Lab Results  Component Value Date   CREATININE 0.54 08/09/2019   BUN 6 08/09/2019   NA 136 08/09/2019   K 3.8 08/09/2019   CL 100 08/09/2019   CO2  26 08/09/2019    Lab Results  Component Value Date   ALT 16 08/09/2019   AST 18 08/09/2019   ALKPHOS 62 08/09/2019   BILITOT 0.8 08/09/2019     Microbiology: Recent Results (from the past 240 hour(s))  Culture, blood (Routine X 2) w Reflex to ID Panel     Status: Abnormal (Preliminary result)   Collection Time: 08/08/19  7:27 AM   Specimen: BLOOD RIGHT FOREARM  Result Value Ref Range Status   Specimen Description BLOOD RIGHT FOREARM  Final   Special Requests   Final    BOTTLES DRAWN AEROBIC AND ANAEROBIC Blood Culture results may not be optimal due to an inadequate volume of blood received in culture bottles   Culture  Setup Time   Final    IN BOTH AEROBIC AND ANAEROBIC BOTTLES GRAM POSITIVE COCCI IN CLUSTERS CRITICAL RESULT CALLED TO, READ BACK BY AND VERIFIED WITH: Karsten [facility] 08/09/19 0102 JDW Performed at [facility], [address] [place], [place] [address]    Culture STAPHYLOCOCCUS AUREUS (A)  Final   Report Status PENDING  Incomplete  Blood Culture ID Panel (Reflexed)     Status: Abnormal   Collection Time: 08/08/19  7:27 AM  Result Value Ref Range Status   Enterococcus species NOT DETECTED NOT DETECTED Final   Listeria monocytogenes NOT DETECTED NOT DETECTED Final   Staphylococcus species DETECTED  (A) NOT DETECTED Final    Comment: CRITICAL RESULT CALLED TO, READ BACK BY AND VERIFIED WITH: J LEDFORD PHARMD 08/09/19 0102 JDW    Staphylococcus aureus (BCID) DETECTED (A) NOT DETECTED Final    Comment: Methicillin (oxacillin) susceptible Staphylococcus aureus (MSSA). Preferred therapy is anti staphylococcal beta lactam antibiotic (Cefazolin or Nafcillin), unless clinically contraindicated. CRITICAL RESULT CALLED TO, READ BACK BY AND VERIFIED WITH: J LEDFORD [facility] 08/09/19 0102 JDW    Methicillin resistance NOT DETECTED NOT DETECTED Final   Streptococcus species NOT DETECTED NOT DETECTED Final   Streptococcus agalactiae NOT DETECTED NOT DETECTED Final   Streptococcus pneumoniae NOT DETECTED NOT DETECTED Final   Streptococcus pyogenes NOT DETECTED NOT DETECTED Final   Acinetobacter baumannii NOT DETECTED NOT DETECTED Final   Enterobacteriaceae species NOT DETECTED NOT DETECTED Final   Enterobacter cloacae complex NOT DETECTED NOT DETECTED Final   Escherichia coli NOT DETECTED NOT DETECTED Final   Klebsiella oxytoca NOT DETECTED NOT DETECTED Final   Klebsiella pneumoniae NOT DETECTED NOT DETECTED Final   Proteus species NOT DETECTED NOT DETECTED Final   Serratia marcescens NOT DETECTED NOT DETECTED Final   Haemophilus influenzae NOT DETECTED NOT DETECTED Final   Neisseria meningitidis NOT DETECTED NOT DETECTED Final   Pseudomonas aeruginosa NOT DETECTED NOT DETECTED Final   Candida albicans NOT DETECTED NOT DETECTED Final   Candida glabrata NOT DETECTED NOT DETECTED Final   Candida krusei NOT DETECTED NOT DETECTED Final   Candida parapsilosis NOT DETECTED NOT DETECTED Final   Candida tropicalis NOT DETECTED NOT DETECTED Final    Comment: Performed at [facility], [place]. [address] [place], [place] [address]  SARS Coronavirus 2 by RT PCR (hospital order, performed in [facility] hospital lab) Nasopharyngeal Nasopharyngeal Swab     Status: None   Collection Time: 08/08/19  9:02 AM    Specimen: Nasopharyngeal Swab  Result Value Ref Range Status   SARS Coronavirus 2 NEGATIVE NEGATIVE Final    Comment: (NOTE) SARS-CoV-2 target nucleic acids are NOT DETECTED.  The SARS-CoV-2 RNA is generally detectable in upper and lower  respiratory specimens during the acute phase of infection. The lowest concentration of SARS-CoV-2 viral copies this assay can detect is 250 copies / mL. A negative result does not preclude SARS-CoV-2 infection and should not be used as the sole basis for treatment or other patient management decisions.  A negative result may occur with improper specimen collection / handling, submission of specimen other than nasopharyngeal swab, presence of viral mutation(s) within the areas targeted by this assay, and inadequate number of viral copies (<250 copies / mL). A negative result must be combined with clinical observations, patient history, and epidemiological information.  Fact Sheet for Patients:   StrictlyIdeas.no  Fact Sheet for Healthcare Providers: BankingDealers.co.za  This test is not yet approved or  cleared by the [place] FDA and has been authorized for detection and/or diagnosis of SARS-CoV-2 by FDA under an Emergency Use Authorization (EUA).  This EUA will remain in effect (meaning this test can be used) for the duration of the COVID-19 declaration under Section 564(b)(1) of the Act, 21 U.S.C. section 360bbb-3(b)(1), unless the authorization is terminated or revoked sooner.  Performed at [facility], [place] [address], [place], [place] [address]   Culture, blood (Routine X 2) w Reflex to ID Panel     Status: None (Preliminary result)   Collection Time: 08/08/19  5:11 PM   Specimen: BLOOD RIGHT HAND  Result Value Ref Range Status   Specimen Description BLOOD RIGHT HAND  Final   Special Requests   Final    BOTTLES DRAWN AEROBIC AND ANAEROBIC Blood Culture adequate volume   Culture   Setup Time   Final    GRAM POSITIVE COCCI IN CLUSTERS IN BOTH AEROBIC AND ANAEROBIC BOTTLES CRITICAL RESULT CALLED TO, READ BACK BY AND VERIFIED WITH: J. LEDFORD,PHARMD 0503 08/09/2019 Mena Goes Performed at [facility], [place] [address] [place],  [address]    Culture [facility] POSITIVE COCCI  Final   Report Status PENDING  Incomplete    Alcide Evener, [facility] [phone] pager  08/09/2019, 12:46 PM

## 2019-08-09 NOTE — Progress Notes (Signed)
PHARMACY - PHYSICIAN COMMUNICATION CRITICAL VALUE ALERT - BLOOD CULTURE IDENTIFICATION (BCID)  Monica Eaton is an 44 y.o. female who presented to [facility] on 08/07/2019 with a chief complaint of epidural abscess  Assessment:  S/P OR this past evening for epidural abscess, MSSA bacteremia, Tmax 100.3, WBC elevated  Name of physician (or Provider) Contacted: Dr. Zada Finders   Current antibiotics: Vancomycin, Levaquin   Changes to prescribed antibiotics recommended:  Continue vancomycin in setting of PCN/cephalosporin allergy DC Levaquin  Results for orders placed or performed during the hospital encounter of 08/07/19  Blood Culture ID Panel (Reflexed) (Collected: 08/08/2019  7:27 AM)  Result Value Ref Range   Enterococcus species NOT DETECTED NOT DETECTED   Listeria monocytogenes NOT DETECTED NOT DETECTED   Staphylococcus species DETECTED (A) NOT DETECTED   Staphylococcus aureus (BCID) DETECTED (A) NOT DETECTED   Methicillin resistance NOT DETECTED NOT DETECTED   Streptococcus species NOT DETECTED NOT DETECTED   Streptococcus agalactiae NOT DETECTED NOT DETECTED   Streptococcus pneumoniae NOT DETECTED NOT DETECTED   Streptococcus pyogenes NOT DETECTED NOT DETECTED   Acinetobacter baumannii NOT DETECTED NOT DETECTED   Enterobacteriaceae species NOT DETECTED NOT DETECTED   Enterobacter cloacae complex NOT DETECTED NOT DETECTED   Escherichia coli NOT DETECTED NOT DETECTED   Klebsiella oxytoca NOT DETECTED NOT DETECTED   Klebsiella pneumoniae NOT DETECTED NOT DETECTED   Proteus species NOT DETECTED NOT DETECTED   Serratia marcescens NOT DETECTED NOT DETECTED   Haemophilus influenzae NOT DETECTED NOT DETECTED   Neisseria meningitidis NOT DETECTED NOT DETECTED   Pseudomonas aeruginosa NOT DETECTED NOT DETECTED   Candida albicans NOT DETECTED NOT DETECTED   Candida glabrata NOT DETECTED NOT DETECTED   Candida krusei NOT DETECTED NOT DETECTED   Candida parapsilosis NOT DETECTED NOT  DETECTED   Candida tropicalis NOT DETECTED NOT DETECTED    Monica Eaton 08/09/2019  1:16 AM

## 2019-08-09 NOTE — Progress Notes (Signed)
°  Echocardiogram 2D Echocardiogram has been performed.  Monica Eaton 08/09/2019, 3:39 PM

## 2019-08-09 NOTE — Progress Notes (Signed)
Neurosurgery Service Post-operative progress note  Assessment & Plan: 44 y.o. woman s/p C3-C6 lami / fusion, seen in PACU, MAEx4, strength stable to improved from preop. No purulent material seen intra-op so no cultures sent.  -activity and diet as tolerated, no brace needed  Monica Eaton  08/09/19 12:06 AM

## 2019-08-09 NOTE — Transfer of Care (Signed)
Immediate Anesthesia Transfer of Care Note  Patient: Monica Eaton  Procedure(s) Performed: POSTERIOR Cervical three - Cervical six LAMINECTOMY AND  FUSION (N/A Neck)  Patient Location: PACU  Anesthesia Type:General  Level of Consciousness: sedated  Airway & Oxygen Therapy: Patient Spontanous Breathing  Post-op Assessment: Report given to RN and Post -op Vital signs reviewed and stable  Post vital signs: Reviewed and stable  Last Vitals:  Vitals Value Taken Time  BP 114/73 08/08/19 2356  Temp    Pulse 100 08/08/19 2359  Resp 23 08/08/19 2359  SpO2 96 % 08/08/19 2359  Vitals shown include unvalidated device data.  Last Pain:  Vitals:   08/08/19 1946  TempSrc: Oral  PainSc:       Patients Stated Pain Goal: 0 (12/92/90 9030)  Complications: No complications documented.

## 2019-08-09 NOTE — Progress Notes (Signed)
Neurosurgery Service Progress Note  Subjective: No acute events overnight, strength subjectively improved, pain / numbness slowly improving   Objective: Vitals:   08/09/19 0119 08/09/19 0405 08/09/19 0808 08/09/19 1157  BP: 109/73 113/69 129/74 122/74  Pulse: 91 92 95 77  Resp: 16 16 18 20   Temp: 98.8 F (37.1 C) 99.6 F (37.6 C) 98.3 F (36.8 C) 98.8 F (37.1 C)  TempSrc: Oral Oral Oral Oral  SpO2:   99% 100%  Weight:      Height:       Temp (24hrs), Avg:98.9 F (37.2 C), Min:97.6 F (36.4 C), Max:100.3 F (37.9 C)  CBC Latest Ref Rng & Units 08/09/2019 08/08/2019 08/08/2019  WBC 4.0 - 10.5 K/uL 18.6(H) 18.9(H) 19.4(H)  Hemoglobin 12.0 - 15.0 g/dL 10.6(L) 11.8(L) 12.0  Hematocrit 36 - 46 % 32.6(L) 36.3 35.8(L)  Platelets 150 - 400 K/uL 296 344 347   BMP Latest Ref Rng & Units 08/09/2019 08/08/2019 08/08/2019  Glucose 70 - 99 mg/dL 141(H) - 143(H)  BUN 6 - 20 mg/dL 6 - 11  Creatinine 0.44 - 1.00 mg/dL 0.54 0.55 0.49  Sodium 135 - 145 mmol/L 136 - 136  Potassium 3.5 - 5.1 mmol/L 3.8 - 3.5  Chloride 98 - 111 mmol/L 100 - 99  CO2 22 - 32 mmol/L 26 - 28  Calcium 8.9 - 10.3 mg/dL 8.8(L) - 9.0    Intake/Output Summary (Last 24 hours) at 08/09/2019 1427 Last data filed at 08/09/2019 1308 Gross per 24 hour  Intake 2823.08 ml  Output 1825 ml  Net 998.08 ml    Current Facility-Administered Medications:  .  [COMPLETED] sodium chloride 0.9 % bolus 1,000 mL, 1,000 mL, Intravenous, Once, Stopped at 08/08/19 0212 **FOLLOWED BY** 0.9 %  sodium chloride infusion, 1,000 mL, Intravenous, Continuous, Alekh, Kshitiz, MD, Last Rate: 75 mL/hr at 08/09/19 1014, 1,000 mL at 08/09/19 1014 .  acetaminophen (TYLENOL) tablet 650 mg, 650 mg, Oral, Q6H PRN **OR** acetaminophen (TYLENOL) suppository 650 mg, 650 mg, Rectal, Q6H PRN, Adefeso, Oladapo, DO .  Chlorhexidine Gluconate Cloth 2 % PADS 6 each, 6 each, Topical, Daily, [name], MD, 6 each at 08/09/19 1019 .  cloNIDine (CATAPRES) tablet 0.1  mg, 0.1 mg, Oral, QID, 0.1 mg at 08/09/19 1320 **FOLLOWED BY** [START ON 08/11/2019] cloNIDine (CATAPRES) tablet 0.1 mg, 0.1 mg, Oral, BH-qamhs **FOLLOWED BY** [START ON 08/13/2019] cloNIDine (CATAPRES) tablet 0.1 mg, 0.1 mg, Oral, QAC breakfast, Alekh, Kshitiz, MD .  dicyclomine (BENTYL) tablet 20 mg, 20 mg, Oral, Q6H PRN, Alekh, Kshitiz, MD .  enoxaparin (LOVENOX) injection 40 mg, 40 mg, Subcutaneous, Q24H, Adefeso, Oladapo, DO .  HYDROmorphone (DILAUDID) injection 1-2 mg, 1-2 mg, Intravenous, Q2H PRN, Alekh, Kshitiz, MD, 2 mg at 08/09/19 1145 .  hydrOXYzine (ATARAX/VISTARIL) tablet 25 mg, 25 mg, Oral, Q6H PRN, Starla Link, Kshitiz, MD, 25 mg at 08/08/19 1838 .  loperamide (IMODIUM) capsule 2-4 mg, 2-4 mg, Oral, PRN, Starla Link, Kshitiz, MD .  methocarbamol (ROBAXIN) 500 mg in dextrose 5 % 50 mL IVPB, 500 mg, Intravenous, Q6H PRN, Alekh, Kshitiz, MD .  methocarbamol (ROBAXIN) tablet 500 mg, 500 mg, Oral, Q8H PRN, Starla Link, Kshitiz, MD, 500 mg at 08/09/19 6606 .  naproxen (NAPROSYN) tablet 500 mg, 500 mg, Oral, BID PRN, Starla Link, Kshitiz, MD, 500 mg at 08/09/19 1240 .  ondansetron (ZOFRAN-ODT) disintegrating tablet 4 mg, 4 mg, Oral, Q6H PRN, Alekh, Kshitiz, MD .  oxyCODONE (Oxy IR/ROXICODONE) immediate release tablet 5-10 mg, 5-10 mg, Oral, Q4H PRN, Starla Link, Kshitiz, MD, 10 mg  at 08/09/19 0649 .  vancomycin (VANCOCIN) IVPB 1000 mg/200 mL premix, 1,000 mg, Intravenous, Q12H, Adefeso, Oladapo, DO, Last Rate: 200 mL/hr at 08/09/19 0528, 1,000 mg at 08/09/19 6060   Physical Exam: Strength diffusely 4/5, diffusely decreased sensation below the neck  Assessment & Plan: 44 y.o. woman s/p C3-7 lami C3-6 PSIF, recovering well.  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

## 2019-08-09 NOTE — Progress Notes (Signed)
Patient ID: Monica Eaton, female   DOB: 1975-05-22, 44 y.o.   MRN: [id number]  PROGRESS NOTE    Tasheika Kitzmiller  YHC:[id number] DOB: October 08, 1975 DOA: 08/07/2019 PCP: Patient, No Pcp Per   Brief Narrative:  44 year old female with history of tobacco abuse and IV drug abuse presented with severe neck pain and upper back pain.  In the ED, she had leukocytosis with CRP of 9.4.  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  C2-3 nonsegmentation and 3 mm of anterolisthesis at C3-4 was also noted.  An attempt for MRI cervical spine with contrast was unsuccessful due to patient not being able to lay still despite IV Dilaudid, Ativan and Valium.  She was subsequently transferred to [facility] for MRI under general anesthesia.  She was started on broad-spectrum IV antibiotics.  Neurosurgery was consulted.  After MRI of spine under general anesthesia, patient underwent surgical intervention by neurosurgery on 08/08/2019.  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  Noncontrast cervical spinal MRI as above.  She subsequently underwent MRI with contrast of cervical/thoracic/lumbar spine under general anesthesia.  MRI of cervical spine was abnormal with probable acute cervical spine discitis/osteomyelitis with cervical spinal stenosis.   -Post MRI with contrast, patient had more weakness in the lower extremities. -She subsequently underwent surgical intervention by neurosurgery.  No purulent material seen Intra-Op so no cultures were sent. -Blood cultures/BCID growing MSSA.  Currently on vancomycin.  Follow sensitivities.  Follow ID recommendations.  Follow repeat cultures in  a.m. -2D echo to rule out vegetation -Wound care as per neurosurgery recommendations. -Pain management.  Bowel regimen.  IV drug abuse Tobacco abuse -Urine drug screen is pending.   -will need counseling at some point   DVT prophylaxis: Lovenox Code Status: Full Family Communication: None at bedside.  Spoke to father at bedside on 08/08/19 Disposition Plan: Status is: Inpatient  Remains inpatient appropriate because:Inpatient level of care appropriate due to severity of illness   Dispo: The patient is from: Home              Anticipated d/c is to: Undermined              Anticipated d/c date is: > 3 days              Patient currently is not medically stable to d/c.  Consultants: Neurosurgery  Procedures:  Posterior C3-C7 laminectomies, C3-C6 instrumented fusion on 08/08/2019  Antimicrobials:  Anti-infectives (From admission, onward)   Start     Dose/Rate Route Frequency Ordered Stop   08/08/19 2042  bacitracin 50,000 Units in sodium chloride 0.9 % 500 mL irrigation  Status:  Discontinued          As needed 08/08/19 2043 08/08/19 2350   08/08/19 1800  vancomycin (VANCOCIN) IVPB 1000 mg/200 mL premix     Discontinue     1,000 mg 200 mL/hr over 60 Minutes Intravenous Every 12 hours 08/08/19 0644     08/08/19 0800  levofloxacin (LEVAQUIN) IVPB 750 mg  Status:  Discontinued        750 mg 100 mL/hr over 90 Minutes Intravenous Every 24 hours 08/08/19 0644 08/09/19 0115   08/08/19 0645  vancomycin (VANCOREADY) IVPB 1250 mg/250 mL  1,250 mg 166.7 mL/hr over 90 Minutes Intravenous  Once 08/08/19 5465 08/08/19 0916       Subjective: Patient seen and examined at bedside.  She is a little more calm today as compared to yesterday morning.  Still complains of some neck pain.  Is able to move her extremities.  Poor historian.  No overnight fever or vomiting reported.  Objective: Vitals:   08/09/19 0100 08/09/19 0119 08/09/19 0405 08/09/19 0808  BP:  109/73 113/69 129/74    Pulse: 91 91 92 95  Resp: 20 16 16 18   Temp:  98.8 F (37.1 C) 99.6 F (37.6 C) 98.3 F (36.8 C)  TempSrc:  Oral Oral Oral  SpO2: 97%   99%  Weight:      Height:        Intake/Output Summary (Last 24 hours) at 08/09/2019 0932 Last data filed at 08/09/2019 6812 Gross per 24 hour  Intake 2723.08 ml  Output 1325 ml  Net 1398.08 ml   Filed Weights   08/07/19 2205 08/08/19 1253 08/08/19 1823  Weight: 59 kg 59 kg 61.2 kg    Examination:  General exam: Looks older than stated age.  Still looks a bit jittery but better than yesterday. Respiratory system: Bilateral decreased breath sounds at bases Cardiovascular system: S1 & S2 heard, Rate controlled Gastrointestinal system: Abdomen is nondistended, soft and nontender. Normal bowel sounds heard. Extremities: No cyanosis, clubbing, edema  Central nervous system: Alert and oriented.  Moving extremities Lymph: No cervical lymphadenopathy Psychiatry: Looks anxious    Data Reviewed: I have personally reviewed following labs and imaging studies  CBC: Recent Labs  Lab 08/08/19 0007 08/08/19 0727 08/09/19 0336  WBC 19.4* 18.9* 18.6*  NEUTROABS 16.4*  --   --   HGB 12.0 11.8* 10.6*  HCT 35.8* 36.3 32.6*  MCV 94.5 95.0 96.2  PLT 347 344 751   Basic Metabolic Panel: Recent Labs  Lab 08/08/19 0007 08/08/19 0727 08/09/19 0336  NA 136  --  136  K 3.5  --  3.8  CL 99  --  100  CO2 28  --  26  GLUCOSE 143*  --  141*  BUN 11  --  6  CREATININE 0.49 0.55 0.54  CALCIUM 9.0  --  8.8*  MG  --   --  1.8  PHOS  --   --  3.6   GFR: Estimated Creatinine Clearance: 88.5 mL/min (by C-G formula based on SCr of 0.54 mg/dL). Liver Function Tests: Recent Labs  Lab 08/08/19 0007 08/09/19 0336  AST 30 18  ALT 22 16  ALKPHOS 89 62  BILITOT 0.8 0.8  PROT 8.2* 6.2*  ALBUMIN 4.5 2.8*   No results for input(s): LIPASE, AMYLASE in the last 168 hours. No results for input(s): AMMONIA in the last 168 hours. Coagulation  Profile: Recent Labs  Lab 08/09/19 0336  INR 1.2   Cardiac Enzymes: No results for input(s): CKTOTAL, CKMB, CKMBINDEX, TROPONINI in the last 168 hours. BNP (last 3 results) No results for input(s): PROBNP in the last 8760 hours. HbA1C: No results for input(s): HGBA1C in the last 72 hours. CBG: No results for input(s): GLUCAP in the last 168 hours. Lipid Profile: No results for input(s): CHOL, HDL, LDLCALC, TRIG, CHOLHDL, LDLDIRECT in the last 72 hours. Thyroid Function Tests: No results for input(s): TSH, T4TOTAL, FREET4, T3FREE, THYROIDAB in the last 72 hours. Anemia Panel: No results for input(s): VITAMINB12, FOLATE, FERRITIN, TIBC, IRON, RETICCTPCT in the last 72 hours. Sepsis  Labs: No results for input(s): PROCALCITON, LATICACIDVEN in the last 168 hours.  Recent Results (from the past 240 hour(s))  Culture, blood (Routine X 2) w Reflex to ID Panel     Status: Abnormal (Preliminary result)   Collection Time: 08/08/19  7:27 AM   Specimen: BLOOD RIGHT FOREARM  Result Value Ref Range Status   Specimen Description BLOOD RIGHT FOREARM  Final   Special Requests   Final    BOTTLES DRAWN AEROBIC AND ANAEROBIC Blood Culture results may not be optimal due to an inadequate volume of blood received in culture bottles   Culture  Setup Time   Final    IN BOTH AEROBIC AND ANAEROBIC BOTTLES GRAM POSITIVE COCCI IN CLUSTERS CRITICAL RESULT CALLED TO, READ BACK BY AND VERIFIED WITH: Karsten Ro [facility] 08/09/19 0102 JDW Performed at [facility], [address] [place], [place] [address]    Culture STAPHYLOCOCCUS AUREUS (A)  Final   Report Status PENDING  Incomplete  Blood Culture ID Panel (Reflexed)     Status: Abnormal   Collection Time: 08/08/19  7:27 AM  Result Value Ref Range Status   Enterococcus species NOT DETECTED NOT DETECTED Final   Listeria monocytogenes NOT DETECTED NOT DETECTED Final   Staphylococcus species DETECTED (A) NOT DETECTED Final    Comment: CRITICAL RESULT  CALLED TO, READ BACK BY AND VERIFIED WITH: J LEDFORD PHARMD 08/09/19 0102 JDW    Staphylococcus aureus (BCID) DETECTED (A) NOT DETECTED Final    Comment: Methicillin (oxacillin) susceptible Staphylococcus aureus (MSSA). Preferred therapy is anti staphylococcal beta lactam antibiotic (Cefazolin or Nafcillin), unless clinically contraindicated. CRITICAL RESULT CALLED TO, READ BACK BY AND VERIFIED WITH: J LEDFORD [facility] 08/09/19 0102 JDW    Methicillin resistance NOT DETECTED NOT DETECTED Final   Streptococcus species NOT DETECTED NOT DETECTED Final   Streptococcus agalactiae NOT DETECTED NOT DETECTED Final   Streptococcus pneumoniae NOT DETECTED NOT DETECTED Final   Streptococcus pyogenes NOT DETECTED NOT DETECTED Final   Acinetobacter baumannii NOT DETECTED NOT DETECTED Final   Enterobacteriaceae species NOT DETECTED NOT DETECTED Final   Enterobacter cloacae complex NOT DETECTED NOT DETECTED Final   Escherichia coli NOT DETECTED NOT DETECTED Final   Klebsiella oxytoca NOT DETECTED NOT DETECTED Final   Klebsiella pneumoniae NOT DETECTED NOT DETECTED Final   Proteus species NOT DETECTED NOT DETECTED Final   Serratia marcescens NOT DETECTED NOT DETECTED Final   Haemophilus influenzae NOT DETECTED NOT DETECTED Final   Neisseria meningitidis NOT DETECTED NOT DETECTED Final   Pseudomonas aeruginosa NOT DETECTED NOT DETECTED Final   Candida albicans NOT DETECTED NOT DETECTED Final   Candida glabrata NOT DETECTED NOT DETECTED Final   Candida krusei NOT DETECTED NOT DETECTED Final   Candida parapsilosis NOT DETECTED NOT DETECTED Final   Candida tropicalis NOT DETECTED NOT DETECTED Final    Comment: Performed at [facility], [place]. [address], [place], [place] [address]  SARS Coronavirus 2 by RT PCR (hospital order, performed in [facility] hospital lab) Nasopharyngeal Nasopharyngeal Swab     Status: None   Collection Time: 08/08/19  9:02 AM   Specimen: Nasopharyngeal Swab  Result Value Ref  Range Status   SARS Coronavirus 2 NEGATIVE NEGATIVE Final    Comment: (NOTE) SARS-CoV-2 target nucleic acids are NOT DETECTED.  The SARS-CoV-2 RNA is generally detectable in upper and lower respiratory specimens during the acute phase of infection. The lowest concentration of SARS-CoV-2 viral copies this assay can detect is 250 copies /  mL. A negative result does not preclude SARS-CoV-2 infection and should not be used as the sole basis for treatment or other patient management decisions.  A negative result may occur with improper specimen collection / handling, submission of specimen other than nasopharyngeal swab, presence of viral mutation(s) within the areas targeted by this assay, and inadequate number of viral copies (<250 copies / mL). A negative result must be combined with clinical observations, patient history, and epidemiological information.  Fact Sheet for Patients:   StrictlyIdeas.no  Fact Sheet for Healthcare Providers: BankingDealers.co.za  This test is not yet approved or  cleared by the [place] FDA and has been authorized for detection and/or diagnosis of SARS-CoV-2 by FDA under an Emergency Use Authorization (EUA).  This EUA will remain in effect (meaning this test can be used) for the duration of the COVID-19 declaration under Section 564(b)(1) of the Act, 21 U.S.C. section 360bbb-3(b)(1), unless the authorization is terminated or revoked sooner.  Performed at [facility], [place] [address] [place], [place] [address]   Culture, blood (Routine X 2) w Reflex to ID Panel     Status: None (Preliminary result)   Collection Time: 08/08/19  5:11 PM   Specimen: BLOOD RIGHT HAND  Result Value Ref Range Status   Specimen Description BLOOD RIGHT HAND  Final   Special Requests   Final    BOTTLES DRAWN AEROBIC AND ANAEROBIC Blood Culture adequate volume   Culture  Setup Time   Final    GRAM POSITIVE COCCI IN  CLUSTERS IN BOTH AEROBIC AND ANAEROBIC BOTTLES CRITICAL RESULT CALLED TO, READ BACK BY AND VERIFIED WITH: J. LEDFORD,PHARMD 0503 08/09/2019 Mena Goes Performed at [facility], [place] [address] [place], [place] [address]    Culture GRAM POSITIVE COCCI  Final   Report Status PENDING  Incomplete         Radiology Studies: DG Cervical Spine 2-3 Views  Result Date: 08/08/2019 CLINICAL DATA:  Posterior cervical fusion EXAM: CERVICAL SPINE - 2-3 VIEW; DG C-ARM 1-60 MIN COMPARISON:  MRI 08/08/2019 FINDINGS: Single lateral intraoperative fluoroscopic image depicts interval placement of a posterior C3-C6 spinal fusion, partially obscured by overlying metallic retractors. Intubation noted at time of examination. No acute hardware complication is evident. Advanced spondylitic changes in the imaged cervical spine are similar to comparisons. Diminished kyphotic curvature of the cervical spine compared to prior MR. IMPRESSION: Intraoperative fluoroscopic image during interval placement of a posterior C3-C6 spinal fusion. No evidence of acute hardware complication though partially obscured by overlying surgical implements. Electronically Signed   By: Lovena Le M.D.   On: 08/08/2019 23:41   MR CERVICAL SPINE WO CONTRAST  Result Date: 08/08/2019 CLINICAL DATA:  Evaluate for epidural abscess. EXAM: MRI CERVICAL SPINE WITHOUT CONTRAST TECHNIQUE: Multiplanar, multisequence MR imaging of the cervical spine was performed. No intravenous contrast was administered. COMPARISON:  None. FINDINGS: Alignment: Reversal of cervical lordosis that is degenerative. 3 mm of anterolisthesis at C3-4 Vertebrae: Sclerotic appearance at the C5 and C6 vertebral bodies attributed to degenerative disease. No clear edematous marrow on the very motion degraded STIR images. Cord: Degenerative compression at C3-4 to C6-7. No definitive cord signal abnormality Posterior Fossa, vertebral arteries, paraspinal tissues: Prevertebral T2  hyperintensity. Disc levels: C2-3: Non segmentation C3-4: Advanced facet osteoarthritis with anterolisthesis, spinal stenosis, and biforaminal impingement. C4-5: Disc narrowing with endplate and especially uncovertebral ridging. Spinal stenosis and biforaminal impingement C5-6: Disc narrowing and endplate degeneration with circumferential bulging and endplate/uncovertebral ridging. Greatest level  of cord compression and severe biforaminal impingement C6-7: Disc narrowing and endplate degeneration with circumferential bulging and endplate/uncovertebral ridging. Cord compression and biforaminal impingement C7-T1:Unremarkable. IMPRESSION: 1. Truncated and motion degraded study without contrast. 2. 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 3. Prevertebral fluid intensity of uncertain significance. No evidence of underlying discitis or osteomyelitis. 4. C2-3 non segmentation.  3 mm of anterolisthesis at C3-4. Electronically Signed   By: Monte Fantasia M.D.   On: 08/08/2019 04:39   MR CERVICAL SPINE W WO CONTRAST  Addendum Date: 08/08/2019   ADDENDUM REPORT: 08/08/2019 17:15 ADDENDUM: Study discussed by telephone with Dr. Aline August on 08/08/2019 at 1705 hours. He advises that the patient has known IV drug abuse and therefore I do strongly suspect cervical spine infection as the acute diagnosis. And furthermore, the underlying multilevel advanced chronic cervical spine degeneration might be the sequelae of previous discitis rather than entirely degenerative in etiology from the C2-C3 segmentation anomaly. Electronically Signed   By: Genevie Ann M.D.   On: 08/08/2019 17:15   Result Date: 08/08/2019 CLINICAL DATA:  44 year old female with neck and upper back pain. Prevertebral fluid on noncontrast cervical spine MRI this morning. Epidural abscess suspected. EXAM: MRI CERVICAL SPINE WITHOUT AND WITH CONTRAST TECHNIQUE: Multiplanar and multiecho pulse sequences of the  cervical spine, to include the craniocervical junction and cervicothoracic junction, were obtained without and with intravenous contrast. CONTRAST:  70mL GADAVIST GADOBUTROL 1 MMOL/ML IV SOLN COMPARISON:  Limited noncontrast cervical MRI 0352 hours today. FINDINGS: Alignment: Reversed cervical lordosis with anterolisthesis of C3 on C4 measuring 3 mm. Straightening of lower cervical lordosis. 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 Sclerotic appearance of both the C5 and C6 vertebral bodies, and there is faint marrow edema and enhancement in the left C5 body (series 5, image 14). Similar subtle marrow edema suspected in the left C4 body (image 13). Superimposed abnormal fluid signal in the C5-C6 disc space eccentric to the right (series 5, image 10). Although no endplate enhancement. And although there is a small volume of prevertebral fluid this is primarily at the C3 and C4 levels. See series 8, image 17). Heterogeneous marrow signal also in the C7 vertebra, elsewhere bone marrow signal appears homogeneous and within normal limits. Cord: There is spinal stenosis and spinal cord mass effect from C3-C4 through C6-C7, with questionable abnormal T2 and STIR heterogeneity within the cord (see series 5, image 12). Furthermore, there is widespread smooth circumferential dural thickening and enhancement throughout the cervical spine, most pronounced at C4 (series 35 image 9 and series 8, image 17). No enhancement of the substance of the spinal cord. The dural thickening seems to abate into the upper thoracic spine. Posterior Fossa, vertebral arteries, paraspinal tissues: Cervicomedullary junction is within normal limits. Negative visible posterior fossa. Preserved major vascular flow voids in the neck and at the skull base. Abnormal prevertebral fluid described above, and the longus coli muscles appear mildly enlarged and heterogeneous down to the C6 level. And there also is  trace abnormal STIR signal in the C4-C5 interspinous ligament on series 5, image 12. There is an oropharyngeal airway in place with trace surrounding retained secretions. Other visible neck soft tissues appear negative. Negative visible lung apices. Disc levels: C2-C3: Congenital incomplete segmentation. Ventral epidural venous prominence and/or dural thickening. Ligament flavum hypertrophy, but no significant stenosis. C3-C4: Degenerative appearing anterolisthesis with circumferential disc bulge, endplate spurring and at least moderate facet hypertrophy  on the left. Multifactorial spinal stenosis with mild to moderate spinal cord mass effect. Moderate to severe bilateral C4 foraminal stenosis. C4-C5: Severe disc space loss with circumferential disc osteophyte complex. Multifactorial spinal stenosis with mild to moderate spinal cord mass effect. Moderate left and moderate to severe right C5 foraminal stenosis. C5-C6: Severe disc space loss with trace fluid in the disc space. Circumferential disc osteophyte complex contributing to multifactorial moderate spinal stenosis and spinal cord mass effect. Moderate left and moderate to severe right C6 foraminal stenosis. C6-C7: Severe disc space loss with circumferential disc osteophyte complex. Broad-based posterior component contributing to mild spinal stenosis with spinal cord mass effect. Moderate right greater than left C7 foraminal stenosis. C7-T1: Relatively preserved disc space at this level. Moderate posterior element hypertrophy including trace facet joint fluid on the left. No significant stenosis. T1-T2: Negative. IMPRESSION: 1. 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 Trace fluid in the C5-C6 disc space suspicious for Discitis. Prevertebral fluid and longus coli muscle edema such that an  alternative diagnosis of Calcific Tendinosis Of The Longus Coli Muscles was considered, but dismissed due to the generalized cervical dural thickening and enhancement. The main differential diagnosis is sequelae of acute cervical spine trauma superimposed on chronic degeneration. And for that consideration a follow-up noncontrast Cervical Spine CT may be valuable. 2. Up to moderate multifactorial cervical spinal stenosis C3-C4 through C6-C7. Spinal cord mass effect with questionable mildly abnormal cord signal on T2/STIR - such as due to cord edema or developing myelomalacia. 3. Extensive sclerotic signal in the C5 and C6 vertebrae favored to be degenerative, with no other evidence of osseous metastatic disease in the spine (see Thoracic And Lumbar MRIs today reported separately). Electronically Signed: By: Genevie Ann M.D. On: 08/08/2019 16:59   MR THORACIC SPINE W WO CONTRAST  Result Date: 08/08/2019 CLINICAL DATA:  44 year old female with neck and upper back pain. Prevertebral fluid on noncontrast cervical spine MRI this morning. Epidural abscess suspected. EXAM: MRI THORACIC WITHOUT AND WITH CONTRAST TECHNIQUE: Multiplanar and multiecho pulse sequences of the thoracic spine were obtained without and with intravenous contrast. CONTRAST:  58mL GADAVIST GADOBUTROL 1 MMOL/ML IV SOLN in conjunction with contrast enhanced imaging of the cervical spine reported separately. COMPARISON:  Cervical spine MRI today reported separately. FINDINGS: Limited cervical spine imaging: Cervical MRI reported separately today. Thoracic spine segmentation:  Appears normal. Alignment:  Preserved thoracic vertebral height and alignment. Vertebrae: No marrow edema or evidence of acute osseous abnormality. Visualized bone marrow signal is within normal limits. Occasional benign vertebral hemangiomas, such as in the left T10 posterior elements. Cord: Thoracic spinal cord and conus are within normal limits. On sagittal postcontrast series 26  faint leptomeningeal enhancement is felt to be related to physiologic vasculature, and not correlated with leptomeningeal abnormality on the axial images. Fairly capacious thoracic spinal canal. No thoracic dural thickening or enhancement. Paraspinal and other soft tissues: Partially visible oral airway. Trace dependent atelectasis in both lungs. Otherwise negative visible thoracic and upper abdominal viscera. Thoracic paraspinal soft tissues are within normal limits. Disc levels: Minimal thoracic spine degeneration, such as subtle disc bulging at T3-T4. Capacious thoracic spinal canal with no spinal or foraminal stenosis. IMPRESSION: 1. Normal for age MRI appearance of the thoracic spine. 2. See cervical spine MRI today reported separately. Electronically Signed   By: Genevie Ann M.D.   On: 08/08/2019 17:03  MR Lumbar Spine W Wo Contrast  Result Date: 08/08/2019 CLINICAL DATA:  44 year old female with neck and upper back pain. Prevertebral fluid on noncontrast cervical spine MRI this morning. Epidural abscess suspected. EXAM: MRI LUMBAR SPINE WITHOUT AND WITH CONTRAST TECHNIQUE: Multiplanar and multiecho pulse sequences of the lumbar spine were obtained without and with intravenous contrast. CONTRAST:  43mL GADAVIST GADOBUTROL 1 MMOL/ML IV SOLN in conjunction with contrast enhanced imaging of the cervical and thoracic reported separately. COMPARISON:  Cervical and thoracic MRI today reported separately. FINDINGS: Segmentation: Normal, concordant with the thoracic spine numbering today. Alignment:  Preserved lumbar lordosis.  No spondylolisthesis. Vertebrae: No marrow edema or evidence of acute osseous abnormality. Visualized bone marrow signal is within normal limits. Intact visible sacrum and SI joints. Conus medullaris and cauda equina: Conus extends to the L1 level. No lower spinal cord or conus signal abnormality. As suspected on the thoracic spine MRI, there is no convincing abnormal intradural enhancement of  the lower thoracic spinal cord or conus on these images. Cauda equina nerve roots appear normal. No lumbar dural thickening or enhancement. Paraspinal and other soft tissues: Negative. Disc levels: L1-L2: Negative. L2-L3: Disc desiccation and disc space loss with broad-based right lateral recess disc extrusion (series 22, image 17 and series 19, image 7). Mild facet hypertrophy. Moderate right lateral recess stenosis (right L3 nerve level). No significant spinal stenosis or foraminal involvement. L3-L4: Mild disc desiccation with broad-based left paracentral and more focal suspected left foraminal disc protrusion (series 22, image 23). However, no spinal or lateral recess stenosis, and mild if any left L3 foraminal stenosis. L4-L5: Disc desiccation with mild disc bulging and similar more focal left foraminal disc protrusion. Mild to moderate facet hypertrophy with trace facet joint fluid. No spinal or lateral recess stenosis. Mild left L4 foraminal stenosis. L5-S1:  Mild facet hypertrophy.  Otherwise negative. IMPRESSION: 1. 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 See also Cervical Spine MRI reported separately. 2. 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 Small left foraminal disc herniations at L3-L4 and L4-L5. Electronically Signed   By: Genevie Ann M.D.   On: 08/08/2019 17:12   DG C-Arm 1-60 Min  Result Date: 08/08/2019 CLINICAL DATA:  Posterior cervical fusion EXAM: CERVICAL SPINE - 2-3 VIEW; DG C-ARM 1-60 MIN COMPARISON:  MRI 08/08/2019 FINDINGS: Single lateral intraoperative fluoroscopic image depicts interval placement of a posterior C3-C6 spinal fusion, partially obscured by overlying metallic retractors. Intubation noted at time of examination. No acute hardware complication is evident. Advanced spondylitic changes in the imaged cervical spine are similar to comparisons. Diminished kyphotic  curvature of the cervical spine compared to prior MR. IMPRESSION: Intraoperative fluoroscopic image during interval placement of a posterior C3-C6 spinal fusion. No evidence of acute hardware complication though partially obscured by overlying surgical implements. Electronically Signed   By: Lovena Le M.D.   On: 08/08/2019 23:41        Scheduled Meds: . Chlorhexidine Gluconate Cloth  6 each Topical Daily  . cloNIDine  0.1 mg Oral QID   Followed by  . [START ON 08/11/2019] cloNIDine  0.1 mg Oral BH-qamhs   Followed by  . [START ON 08/13/2019] cloNIDine  0.1 mg Oral QAC breakfast  . enoxaparin (LOVENOX) injection  40 mg Subcutaneous Q24H   Continuous Infusions: . sodium chloride Stopped (08/08/19 0707)  . methocarbamol (ROBAXIN) IV    . vancomycin 1,000 mg (08/09/19 0528)  Aline August, MD [facility] 08/09/2019, 9:32 AM

## 2019-08-10 ENCOUNTER — Encounter (HOSPITAL_COMMUNITY): Payer: Self-pay | Admitting: Internal Medicine

## 2019-08-10 DIAGNOSIS — D72825 Bandemia: Secondary | ICD-10-CM

## 2019-08-10 DIAGNOSIS — F1990 Other psychoactive substance use, unspecified, uncomplicated: Secondary | ICD-10-CM

## 2019-08-10 DIAGNOSIS — R7881 Bacteremia: Secondary | ICD-10-CM

## 2019-08-10 LAB — CBC WITH DIFFERENTIAL/PLATELET
Abs Immature Granulocytes: 0.06 10*3/uL (ref 0.00–0.07)
Basophils Absolute: 0 10*3/uL (ref 0.0–0.1)
Basophils Relative: 0 %
Eosinophils Absolute: 0 10*3/uL (ref 0.0–0.5)
Eosinophils Relative: 0 %
HCT: 28.8 % — ABNORMAL LOW (ref 36.0–46.0)
Hemoglobin: 9.3 g/dL — ABNORMAL LOW (ref 12.0–15.0)
Immature Granulocytes: 1 %
Lymphocytes Relative: 10 %
Lymphs Abs: 1.3 10*3/uL (ref 0.7–4.0)
MCH: 31.5 pg (ref 26.0–34.0)
MCHC: 32.3 g/dL (ref 30.0–36.0)
MCV: 97.6 fL (ref 80.0–100.0)
Monocytes Absolute: 1.3 10*3/uL — ABNORMAL HIGH (ref 0.1–1.0)
Monocytes Relative: 10 %
Neutro Abs: 10.6 10*3/uL — ABNORMAL HIGH (ref 1.7–7.7)
Neutrophils Relative %: 79 %
Platelets: 265 10*3/uL (ref 150–400)
RBC: 2.95 MIL/uL — ABNORMAL LOW (ref 3.87–5.11)
RDW: 13 % (ref 11.5–15.5)
WBC: 13.2 10*3/uL — ABNORMAL HIGH (ref 4.0–10.5)
nRBC: 0 % (ref 0.0–0.2)

## 2019-08-10 LAB — HEPATITIS B SURFACE ANTIGEN: Hepatitis B Surface Ag: NONREACTIVE

## 2019-08-10 LAB — BASIC METABOLIC PANEL
Anion gap: 9 (ref 5–15)
BUN: 7 mg/dL (ref 6–20)
CO2: 23 mmol/L (ref 22–32)
Calcium: 8 mg/dL — ABNORMAL LOW (ref 8.9–10.3)
Chloride: 104 mmol/L (ref 98–111)
Creatinine, Ser: 0.48 mg/dL (ref 0.44–1.00)
GFR calc Af Amer: >60 mL/min (ref >60)
GFR calc non Af Amer: >60 mL/min (ref >60)
Glucose, Bld: 162 mg/dL — ABNORMAL HIGH (ref 70–99)
Potassium: 3.5 mmol/L (ref 3.5–5.1)
Sodium: 136 mmol/L (ref 135–145)

## 2019-08-10 LAB — C-REACTIVE PROTEIN: CRP: 14.4 mg/dL — ABNORMAL HIGH (ref <1.0)

## 2019-08-10 LAB — SEDIMENTATION RATE: Sed Rate: 65 mm/hr — ABNORMAL HIGH (ref 0–22)

## 2019-08-10 LAB — HEPATITIS C ANTIBODY: HCV Ab: NONREACTIVE

## 2019-08-10 LAB — CULTURE, BLOOD (ROUTINE X 2)

## 2019-08-10 LAB — HEPATITIS A ANTIBODY, TOTAL: hep A Total Ab: NONREACTIVE

## 2019-08-10 LAB — MAGNESIUM: Magnesium: 2 mg/dL (ref 1.7–2.4)

## 2019-08-10 MED ORDER — LORAZEPAM 1 MG PO TABS
1.0000 mg | ORAL_TABLET | ORAL | Status: AC | PRN
  Administered 2019-08-13: 2 mg via ORAL
  Filled 2019-08-10: qty 2

## 2019-08-10 MED ORDER — METHOCARBAMOL 750 MG PO TABS
750.0000 mg | ORAL_TABLET | Freq: Four times a day (QID) | ORAL | Status: DC | PRN
  Administered 2019-08-10 – 2019-08-23 (×19): 750 mg via ORAL
  Filled 2019-08-10 (×19): qty 1

## 2019-08-10 MED ORDER — LORAZEPAM 2 MG/ML IJ SOLN
1.0000 mg | INTRAMUSCULAR | Status: AC | PRN
  Administered 2019-08-10 – 2019-08-12 (×2): 2 mg via INTRAVENOUS
  Administered 2019-08-12: 4 mg via INTRAVENOUS
  Administered 2019-08-13 (×3): 2 mg via INTRAVENOUS
  Filled 2019-08-10 (×7): qty 1

## 2019-08-10 MED ORDER — THIAMINE HCL 100 MG/ML IJ SOLN: 100.0000 mg | Freq: Every day | INTRAMUSCULAR | Status: DC

## 2019-08-10 MED ORDER — ADULT MULTIVITAMIN W/MINERALS CH
1.0000 | ORAL_TABLET | Freq: Every day | ORAL | Status: DC
  Administered 2019-08-10 – 2019-09-08 (×30): 1 via ORAL
  Filled 2019-08-10 (×30): qty 1

## 2019-08-10 MED ORDER — VANCOMYCIN HCL 750 MG/150ML IV SOLN
750.0000 mg | Freq: Three times a day (TID) | INTRAVENOUS | Status: DC
  Administered 2019-08-10 – 2019-08-11 (×3): 750 mg via INTRAVENOUS
  Filled 2019-08-10 (×4): qty 150

## 2019-08-10 MED ORDER — MELATONIN 3 MG PO TABS
3.0000 mg | ORAL_TABLET | Freq: Once | ORAL | Status: AC | PRN
  Administered 2019-08-10: 3 mg via ORAL
  Filled 2019-08-10: qty 1

## 2019-08-10 MED ORDER — THIAMINE HCL 100 MG PO TABS
100.0000 mg | ORAL_TABLET | Freq: Every day | ORAL | Status: DC
  Administered 2019-08-10 – 2019-09-08 (×30): 100 mg via ORAL
  Filled 2019-08-10 (×30): qty 1

## 2019-08-10 MED ORDER — SODIUM CHLORIDE 0.9 % IV SOLN
1000.0000 mL | INTRAVENOUS | Status: DC
  Administered 2019-08-10 (×2): 1000 mL via INTRAVENOUS

## 2019-08-10 MED ORDER — FOLIC ACID 1 MG PO TABS
1.0000 mg | ORAL_TABLET | Freq: Every day | ORAL | Status: DC
  Administered 2019-08-10 – 2019-09-08 (×30): 1 mg via ORAL
  Filled 2019-08-10 (×30): qty 1

## 2019-08-10 NOTE — Progress Notes (Signed)
Pharmacy Antibiotic Note  Monica Eaton is a 44 y.o. female admitted on 08/07/2019 with MSSA epidural abscess and diskitis. Pharmacy has been consulted for Vancomycin dosing.   The patient has allergies listed to PCN of hives and unknown to cephalosporins. Per discussion with the patient - her reaction occurred when she was a child. Attempts to clarify with her parents have been unsuccessful. Will continue Vancomycin for now.   Will follow-up for consideration of PCN allergy testing and/or challenge to be able to narrow to first line therapy.   Plan: - Resume Vancomycin at a dose of 750 mg IV every 8 hours - Consider PCN allergy testing and/or oral challenge on 7/5 - Will continue to follow renal function, culture results, LOT, and antibiotic de-escalation plans   Height: 5\' 7"  (170.2 cm) Weight: 61.2 kg (134 lb 14.7 oz) IBW/kg (Calculated) : 61.6  Temp (24hrs), Avg:98.5 F (36.9 C), Min:98.1 F (36.7 C), Max:98.9 F (37.2 C)  Recent Labs  Lab 08/08/19 0007 08/08/19 0727 08/09/19 0336 08/10/19 0704  WBC 19.4* 18.9* 18.6* 13.2*  CREATININE 0.49 0.55 0.54 0.48    Estimated Creatinine Clearance: 88.5 mL/min (by C-G formula based on SCr of 0.48 mg/dL).    Allergies  Allergen Reactions  . Cefaclor   . Cephalosporins   . Nitrofurantoin   . Penicillins     Other reaction(s): Hives    7/2 Vanc >> 7/2 levaquin >>7/3  7/2 BCx - MSSA  Thank you for allowing pharmacy to be a part of this patient's care.  Alycia Rossetti, PharmD, BCPS Clinical Pharmacist Clinical phone for 08/10/2019: [phone] 08/10/2019 1:06 PM   **Pharmacist phone directory can now be found on [email or web address] ([email or web address]).  Listed under [place].

## 2019-08-10 NOTE — Progress Notes (Signed)
Patient ID: Monica Eaton, female   DOB: [birth year], 44 y.o.   MRN: [id number]  PROGRESS NOTE    Monica Eaton  RAQ:[id number] DOB: 02-26-1975 DOA: 08/07/2019 PCP: Patient, No Pcp Per   Brief Narrative:  44 year old female with history of tobacco abuse and IV drug abuse presented with severe neck pain and upper back pain.  In the ED, she had leukocytosis with CRP of 9.4.  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  C2-3 nonsegmentation and 3 mm of anterolisthesis at C3-4 was also noted.  An attempt for MRI cervical spine with contrast was unsuccessful due to patient not being able to lay still despite IV Dilaudid, Ativan and Valium.  She was subsequently transferred to [facility] for MRI under general anesthesia.  She was started on broad-spectrum IV antibiotics.  Neurosurgery was consulted.  After MRI of spine under general anesthesia, patient underwent surgical intervention by neurosurgery on 08/08/2019.  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  Noncontrast cervical spinal MRI as above.  She subsequently underwent MRI with contrast of cervical/thoracic/lumbar spine under general anesthesia.  MRI of cervical spine was abnormal with probable acute cervical spine discitis/osteomyelitis with cervical spinal stenosis.   -Post MRI with contrast, patient had more weakness in the lower extremities. -She subsequently underwent surgical intervention by neurosurgery.  No purulent material seen Intra-Op so no cultures were sent. -Blood cultures/BCID growing MSSA.  ID consultation appreciated.  Antibiotics have been switched to Ancef.  Follow ID recommendations.  Follow  repeat cultures today. -2D echo to rule out vegetation.  Will eventually need TEE -Wound care as per neurosurgery recommendations. -WBCs improving. -Pain management.  Bowel regimen.  IV drug abuse Tobacco abuse -Urine drug screen is positive for benzodiazepines, opiates and tetrahydrocannabinol -Education officer, museum consult.   DVT prophylaxis: Lovenox Code Status: Full Family Communication: None at bedside.  Spoke to father on phone on 08/09/2019 Disposition Plan: Status is: Inpatient  Remains inpatient appropriate because:Inpatient level of care appropriate due to severity of illness   Dispo: The patient is from: Home              Anticipated d/c is to: Undermined              Anticipated d/c date is: > 3 days              Patient currently is not medically stable to d/c.  Consultants: Neurosurgery  Procedures:  Posterior C3-C7 laminectomies, C3-C6 instrumented fusion on 08/08/2019  Antimicrobials:  Anti-infectives (From admission, onward)   Start     Dose/Rate Route Frequency Ordered Stop   08/08/19 2042  bacitracin 50,000 Units in sodium chloride 0.9 % 500 mL irrigation  Status:  Discontinued          As needed 08/08/19 2043 08/08/19 2350   08/08/19 1800  vancomycin (VANCOCIN) IVPB 1000 mg/200 mL premix     Discontinue     1,000 mg 200 mL/hr over 60 Minutes Intravenous Every 12 hours 08/08/19 0644     08/08/19 0800  levofloxacin (LEVAQUIN) IVPB 750 mg  Status:  Discontinued        750 mg 100 mL/hr over 90 Minutes Intravenous Every 24 hours 08/08/19 0644 08/09/19 0115   08/08/19 0645  vancomycin (VANCOREADY)  IVPB 1250 mg/250 mL        1,250 mg 166.7 mL/hr over 90 Minutes Intravenous  Once 08/08/19 6269 08/08/19 0916       Subjective: Patient seen and examined at bedside.  Still complains of neck pain.  Poor historian.  No overnight fever or vomiting reported. Objective: Vitals:   08/09/19 1735 08/09/19 1946 08/09/19 2330 08/10/19 0455  BP: 113/69 106/71 (!) 101/56 116/74    Pulse:  99 82 80  Resp:  16 20 16   Temp:  98.6 F (37 C) 98.9 F (37.2 C) 98.4 F (36.9 C)  TempSrc:  Oral Oral Oral  SpO2:  98%    Weight:      Height:        Intake/Output Summary (Last 24 hours) at 08/10/2019 0802 Last data filed at 08/10/2019 0600 Gross per 24 hour  Intake 3028.8 ml  Output 900 ml  Net 2128.8 ml   Filed Weights   08/07/19 2205 08/08/19 1253 08/08/19 1823  Weight: 59 kg 59 kg 61.2 kg    Examination:  General exam: Looks older than stated age.  Looks chronically ill.  No distress.  Respiratory system: Bilateral decreased breath sounds at bases, no wheezing Cardiovascular system: Rate controlled, S1-S2 heard Gastrointestinal system: Abdomen is nondistended, soft and nontender.  Bowel sounds are heard  extremities: No edema or clubbing Central nervous system: Sleepy, wakes up slightly, complains of neck pain then almost falls back to sleep.  Moves extremities Lymph: No palpable cervical lymph nodes Psychiatry: Anxious looking when wakes up    Data Reviewed: I have personally reviewed following labs and imaging studies  CBC: Recent Labs  Lab 08/08/19 0007 08/08/19 0727 08/09/19 0336 08/10/19 0704  WBC 19.4* 18.9* 18.6* 13.2*  NEUTROABS 16.4*  --   --  10.6*  HGB 12.0 11.8* 10.6* 9.3*  HCT 35.8* 36.3 32.6* 28.8*  MCV 94.5 95.0 96.2 97.6  PLT 347 344 296 485   Basic Metabolic Panel: Recent Labs  Lab 08/08/19 0007 08/08/19 0727 08/09/19 0336  NA 136  --  136  K 3.5  --  3.8  CL 99  --  100  CO2 28  --  26  GLUCOSE 143*  --  141*  BUN 11  --  6  CREATININE 0.49 0.55 0.54  CALCIUM 9.0  --  8.8*  MG  --   --  1.8  PHOS  --   --  3.6   GFR: Estimated Creatinine Clearance: 88.5 mL/min (by C-G formula based on SCr of 0.54 mg/dL). Liver Function Tests: Recent Labs  Lab 08/08/19 0007 08/09/19 0336  AST 30 18  ALT 22 16  ALKPHOS 89 62  BILITOT 0.8 0.8  PROT 8.2* 6.2*  ALBUMIN 4.5 2.8*   No results for input(s): LIPASE, AMYLASE in  the last 168 hours. No results for input(s): AMMONIA in the last 168 hours. Coagulation Profile: Recent Labs  Lab 08/09/19 0336  INR 1.2   Cardiac Enzymes: No results for input(s): CKTOTAL, CKMB, CKMBINDEX, TROPONINI in the last 168 hours. BNP (last 3 results) No results for input(s): PROBNP in the last 8760 hours. HbA1C: No results for input(s): HGBA1C in the last 72 hours. CBG: No results for input(s): GLUCAP in the last 168 hours. Lipid Profile: No results for input(s): CHOL, HDL, LDLCALC, TRIG, CHOLHDL, LDLDIRECT in the last 72 hours. Thyroid Function Tests: No results for input(s): TSH, T4TOTAL, FREET4, T3FREE, THYROIDAB in the last 72 hours. Anemia Panel: No results for  input(s): VITAMINB12, FOLATE, FERRITIN, TIBC, IRON, RETICCTPCT in the last 72 hours. Sepsis Labs: No results for input(s): PROCALCITON, LATICACIDVEN in the last 168 hours.  Recent Results (from the past 240 hour(s))  Culture, blood (Routine X 2) w Reflex to ID Panel     Status: Abnormal (Preliminary result)   Collection Time: 08/08/19  7:27 AM   Specimen: BLOOD RIGHT FOREARM  Result Value Ref Range Status   Specimen Description BLOOD RIGHT FOREARM  Final   Special Requests   Final    BOTTLES DRAWN AEROBIC AND ANAEROBIC Blood Culture results may not be optimal due to an inadequate volume of blood received in culture bottles   Culture  Setup Time   Final    IN BOTH AEROBIC AND ANAEROBIC BOTTLES GRAM POSITIVE COCCI IN CLUSTERS CRITICAL RESULT CALLED TO, READ BACK BY AND VERIFIED WITH: Karsten Ro [facility] 08/09/19 0102 JDW Performed at [facility], [address] [place], [place] [address]    Culture STAPHYLOCOCCUS AUREUS (A)  Final   Report Status PENDING  Incomplete  Blood Culture ID Panel (Reflexed)     Status: Abnormal   Collection Time: 08/08/19  7:27 AM  Result Value Ref Range Status   Enterococcus species NOT DETECTED NOT DETECTED Final   Listeria monocytogenes NOT DETECTED NOT DETECTED Final    Staphylococcus species DETECTED (A) NOT DETECTED Final    Comment: CRITICAL RESULT CALLED TO, READ BACK BY AND VERIFIED WITH: J LEDFORD PHARMD 08/09/19 0102 JDW    Staphylococcus aureus (BCID) DETECTED (A) NOT DETECTED Final    Comment: Methicillin (oxacillin) susceptible Staphylococcus aureus (MSSA). Preferred therapy is anti staphylococcal beta lactam antibiotic (Cefazolin or Nafcillin), unless clinically contraindicated. CRITICAL RESULT CALLED TO, READ BACK BY AND VERIFIED WITH: J LEDFORD [facility] 08/09/19 0102 JDW    Methicillin resistance NOT DETECTED NOT DETECTED Final   Streptococcus species NOT DETECTED NOT DETECTED Final   Streptococcus agalactiae NOT DETECTED NOT DETECTED Final   Streptococcus pneumoniae NOT DETECTED NOT DETECTED Final   Streptococcus pyogenes NOT DETECTED NOT DETECTED Final   Acinetobacter baumannii NOT DETECTED NOT DETECTED Final   Enterobacteriaceae species NOT DETECTED NOT DETECTED Final   Enterobacter cloacae complex NOT DETECTED NOT DETECTED Final   Escherichia coli NOT DETECTED NOT DETECTED Final   Klebsiella oxytoca NOT DETECTED NOT DETECTED Final   Klebsiella pneumoniae NOT DETECTED NOT DETECTED Final   Proteus species NOT DETECTED NOT DETECTED Final   Serratia marcescens NOT DETECTED NOT DETECTED Final   Haemophilus influenzae NOT DETECTED NOT DETECTED Final   Neisseria meningitidis NOT DETECTED NOT DETECTED Final   Pseudomonas aeruginosa NOT DETECTED NOT DETECTED Final   Candida albicans NOT DETECTED NOT DETECTED Final   Candida glabrata NOT DETECTED NOT DETECTED Final   Candida krusei NOT DETECTED NOT DETECTED Final   Candida parapsilosis NOT DETECTED NOT DETECTED Final   Candida tropicalis NOT DETECTED NOT DETECTED Final    Comment: Performed at [facility], [place]. [address] [place],  [address]  SARS Coronavirus 2 by RT PCR (hospital order, performed in [facility] hospital lab) Nasopharyngeal Nasopharyngeal Swab     Status: None    Collection Time: 08/08/19  9:02 AM   Specimen: Nasopharyngeal Swab  Result Value Ref Range Status   SARS Coronavirus 2 NEGATIVE NEGATIVE Final    Comment: (NOTE) SARS-CoV-2 target nucleic acids are NOT DETECTED.  The SARS-CoV-2 RNA is generally detectable in upper and lower respiratory specimens during the acute phase of infection. The lowest  concentration of SARS-CoV-2 viral copies this assay can detect is 250 copies / mL. A negative result does not preclude SARS-CoV-2 infection and should not be used as the sole basis for treatment or other patient management decisions.  A negative result may occur with improper specimen collection / handling, submission of specimen other than nasopharyngeal swab, presence of viral mutation(s) within the areas targeted by this assay, and inadequate number of viral copies (<250 copies / mL). A negative result must be combined with clinical observations, patient history, and epidemiological information.  Fact Sheet for Patients:   StrictlyIdeas.no  Fact Sheet for Healthcare Providers: BankingDealers.co.za  This test is not yet approved or  cleared by the [place] FDA and has been authorized for detection and/or diagnosis of SARS-CoV-2 by FDA under an Emergency Use Authorization (EUA).  This EUA will remain in effect (meaning this test can be used) for the duration of the COVID-19 declaration under Section 564(b)(1) of the Act, 21 U.S.C. section 360bbb-3(b)(1), unless the authorization is terminated or revoked sooner.  Performed at [facility], [place] [address] [place], [place] [address]   Culture, blood (Routine X 2) w Reflex to ID Panel     Status: None (Preliminary result)   Collection Time: 08/08/19  5:11 PM   Specimen: BLOOD RIGHT HAND  Result Value Ref Range Status   Specimen Description BLOOD RIGHT HAND  Final   Special Requests   Final    BOTTLES DRAWN AEROBIC AND ANAEROBIC  Blood Culture adequate volume   Culture  Setup Time   Final    GRAM POSITIVE COCCI IN CLUSTERS IN BOTH AEROBIC AND ANAEROBIC BOTTLES CRITICAL RESULT CALLED TO, READ BACK BY AND VERIFIED WITH: J. LEDFORD,PHARMD 0503 08/09/2019 Mena Goes Performed at [facility], [place] [address], [place], [place] [address]    Culture GRAM POSITIVE COCCI  Final   Report Status PENDING  Incomplete         Radiology Studies: DG Cervical Spine 2-3 Views  Result Date: 08/08/2019 CLINICAL DATA:  Posterior cervical fusion EXAM: CERVICAL SPINE - 2-3 VIEW; DG C-ARM 1-60 MIN COMPARISON:  MRI 08/08/2019 FINDINGS: Single lateral intraoperative fluoroscopic image depicts interval placement of a posterior C3-C6 spinal fusion, partially obscured by overlying metallic retractors. Intubation noted at time of examination. No acute hardware complication is evident. Advanced spondylitic changes in the imaged cervical spine are similar to comparisons. Diminished kyphotic curvature of the cervical spine compared to prior MR. IMPRESSION: Intraoperative fluoroscopic image during interval placement of a posterior C3-C6 spinal fusion. No evidence of acute hardware complication though partially obscured by overlying surgical implements. Electronically Signed   By: Lovena Le M.D.   On: 08/08/2019 23:41   MR CERVICAL SPINE W WO CONTRAST  Addendum Date: 08/08/2019   ADDENDUM REPORT: 08/08/2019 17:15 ADDENDUM: Study discussed by telephone with Dr. Aline August on 08/08/2019 at 1705 hours. He advises that the patient has known IV drug abuse and therefore I do strongly suspect cervical spine infection as the acute diagnosis. And furthermore, the underlying multilevel advanced chronic cervical spine degeneration might be the sequelae of previous discitis rather than entirely degenerative in etiology from the C2-C3 segmentation anomaly. Electronically Signed   By: Genevie Ann M.D.   On: 08/08/2019 17:15   Result Date: 08/08/2019 CLINICAL DATA:   44 year old female with neck and upper back pain. Prevertebral fluid on noncontrast cervical spine MRI this morning. Epidural abscess suspected. EXAM: MRI CERVICAL SPINE WITHOUT AND WITH CONTRAST TECHNIQUE: Multiplanar  and multiecho pulse sequences of the cervical spine, to include the craniocervical junction and cervicothoracic junction, were obtained without and with intravenous contrast. CONTRAST:  59mL GADAVIST GADOBUTROL 1 MMOL/ML IV SOLN COMPARISON:  Limited noncontrast cervical MRI 0352 hours today. FINDINGS: Alignment: Reversed cervical lordosis with anterolisthesis of C3 on C4 measuring 3 mm. Straightening of lower cervical lordosis. 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 Sclerotic appearance of both the C5 and C6 vertebral bodies, and there is faint marrow edema and enhancement in the left C5 body (series 5, image 14). Similar subtle marrow edema suspected in the left C4 body (image 13). Superimposed abnormal fluid signal in the C5-C6 disc space eccentric to the right (series 5, image 10). Although no endplate enhancement. And although there is a small volume of prevertebral fluid this is primarily at the C3 and C4 levels. See series 8, image 17). Heterogeneous marrow signal also in the C7 vertebra, elsewhere bone marrow signal appears homogeneous and within normal limits. Cord: There is spinal stenosis and spinal cord mass effect from C3-C4 through C6-C7, with questionable abnormal T2 and STIR heterogeneity within the cord (see series 5, image 12). Furthermore, there is widespread smooth circumferential dural thickening and enhancement throughout the cervical spine, most pronounced at C4 (series 35 image 9 and series 8, image 17). No enhancement of the substance of the spinal cord. The dural thickening seems to abate into the upper thoracic spine. Posterior Fossa, vertebral arteries, paraspinal tissues: Cervicomedullary junction is within normal limits.  Negative visible posterior fossa. Preserved major vascular flow voids in the neck and at the skull base. Abnormal prevertebral fluid described above, and the longus coli muscles appear mildly enlarged and heterogeneous down to the C6 level. And there also is trace abnormal STIR signal in the C4-C5 interspinous ligament on series 5, image 12. There is an oropharyngeal airway in place with trace surrounding retained secretions. Other visible neck soft tissues appear negative. Negative visible lung apices. Disc levels: C2-C3: Congenital incomplete segmentation. Ventral epidural venous prominence and/or dural thickening. Ligament flavum hypertrophy, but no significant stenosis. C3-C4: Degenerative appearing anterolisthesis with circumferential disc bulge, endplate spurring and at least moderate facet hypertrophy on the left. Multifactorial spinal stenosis with mild to moderate spinal cord mass effect. Moderate to severe bilateral C4 foraminal stenosis. C4-C5: Severe disc space loss with circumferential disc osteophyte complex. Multifactorial spinal stenosis with mild to moderate spinal cord mass effect. Moderate left and moderate to severe right C5 foraminal stenosis. C5-C6: Severe disc space loss with trace fluid in the disc space. Circumferential disc osteophyte complex contributing to multifactorial moderate spinal stenosis and spinal cord mass effect. Moderate left and moderate to severe right C6 foraminal stenosis. C6-C7: Severe disc space loss with circumferential disc osteophyte complex. Broad-based posterior component contributing to mild spinal stenosis with spinal cord mass effect. Moderate right greater than left C7 foraminal stenosis. C7-T1: Relatively preserved disc space at this level. Moderate posterior element hypertrophy including trace facet joint fluid on the left. No significant stenosis. T1-T2: Negative. IMPRESSION: 1. 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 Trace fluid in the C5-C6 disc space suspicious for Discitis. Prevertebral fluid and longus coli muscle edema such that an alternative diagnosis of Calcific Tendinosis Of The Longus Coli Muscles was considered, but dismissed due to the generalized cervical dural thickening and enhancement. The main differential diagnosis  is sequelae of acute cervical spine trauma superimposed on chronic degeneration. And for that consideration a follow-up noncontrast Cervical Spine CT may be valuable. 2. Up to moderate multifactorial cervical spinal stenosis C3-C4 through C6-C7. Spinal cord mass effect with questionable mildly abnormal cord signal on T2/STIR - such as due to cord edema or developing myelomalacia. 3. Extensive sclerotic signal in the C5 and C6 vertebrae favored to be degenerative, with no other evidence of osseous metastatic disease in the spine (see Thoracic And Lumbar MRIs today reported separately). Electronically Signed: By: Genevie Ann M.D. On: 08/08/2019 16:59   MR THORACIC SPINE W WO CONTRAST  Result Date: 08/08/2019 CLINICAL DATA:  44 year old female with neck and upper back pain. Prevertebral fluid on noncontrast cervical spine MRI this morning. Epidural abscess suspected. EXAM: MRI THORACIC WITHOUT AND WITH CONTRAST TECHNIQUE: Multiplanar and multiecho pulse sequences of the thoracic spine were obtained without and with intravenous contrast. CONTRAST:  89mL GADAVIST GADOBUTROL 1 MMOL/ML IV SOLN in conjunction with contrast enhanced imaging of the cervical spine reported separately. COMPARISON:  Cervical spine MRI today reported separately. FINDINGS: Limited cervical spine imaging: Cervical MRI reported separately today. Thoracic spine segmentation:  Appears normal. Alignment:  Preserved thoracic vertebral height and alignment. Vertebrae: No marrow edema or evidence of acute  osseous abnormality. Visualized bone marrow signal is within normal limits. Occasional benign vertebral hemangiomas, such as in the left T10 posterior elements. Cord: Thoracic spinal cord and conus are within normal limits. On sagittal postcontrast series 26 faint leptomeningeal enhancement is felt to be related to physiologic vasculature, and not correlated with leptomeningeal abnormality on the axial images. Fairly capacious thoracic spinal canal. No thoracic dural thickening or enhancement. Paraspinal and other soft tissues: Partially visible oral airway. Trace dependent atelectasis in both lungs. Otherwise negative visible thoracic and upper abdominal viscera. Thoracic paraspinal soft tissues are within normal limits. Disc levels: Minimal thoracic spine degeneration, such as subtle disc bulging at T3-T4. Capacious thoracic spinal canal with no spinal or foraminal stenosis. IMPRESSION: 1. Normal for age MRI appearance of the thoracic spine. 2. See cervical spine MRI today reported separately. Electronically Signed   By: Genevie Ann M.D.   On: 08/08/2019 17:03   MR Lumbar Spine W Wo Contrast  Result Date: 08/08/2019 CLINICAL DATA:  44 year old female with neck and upper back pain. Prevertebral fluid on noncontrast cervical spine MRI this morning. Epidural abscess suspected. EXAM: MRI LUMBAR SPINE WITHOUT AND WITH CONTRAST TECHNIQUE: Multiplanar and multiecho pulse sequences of the lumbar spine were obtained without and with intravenous contrast. CONTRAST:  68mL GADAVIST GADOBUTROL 1 MMOL/ML IV SOLN in conjunction with contrast enhanced imaging of the cervical and thoracic reported separately. COMPARISON:  Cervical and thoracic MRI today reported separately. FINDINGS: Segmentation: Normal, concordant with the thoracic spine numbering today. Alignment:  Preserved lumbar lordosis.  No spondylolisthesis. Vertebrae: No marrow edema or evidence of acute osseous abnormality. Visualized bone marrow signal is within normal  limits. Intact visible sacrum and SI joints. Conus medullaris and cauda equina: Conus extends to the L1 level. No lower spinal cord or conus signal abnormality. As suspected on the thoracic spine MRI, there is no convincing abnormal intradural enhancement of the lower thoracic spinal cord or conus on these images. Cauda equina nerve roots appear normal. No lumbar dural thickening or enhancement. Paraspinal and other soft tissues: Negative. Disc levels: L1-L2: Negative. L2-L3: Disc desiccation and disc space loss with broad-based right lateral recess disc extrusion (series 22, image 17 and series 19, image 7).  Mild facet hypertrophy. Moderate right lateral recess stenosis (right L3 nerve level). No significant spinal stenosis or foraminal involvement. L3-L4: Mild disc desiccation with broad-based left paracentral and more focal suspected left foraminal disc protrusion (series 22, image 23). However, no spinal or lateral recess stenosis, and mild if any left L3 foraminal stenosis. L4-L5: Disc desiccation with mild disc bulging and similar more focal left foraminal disc protrusion. Mild to moderate facet hypertrophy with trace facet joint fluid. No spinal or lateral recess stenosis. Mild left L4 foraminal stenosis. L5-S1:  Mild facet hypertrophy.  Otherwise negative. IMPRESSION: 1. 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 See also Cervical Spine MRI reported separately. 2. 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 Small left foraminal disc herniations at L3-L4 and L4-L5. Electronically Signed   By: Genevie Ann M.D.   On: 08/08/2019 17:12   DG C-Arm 1-60 Min  Result Date: 08/08/2019 CLINICAL DATA:  Posterior cervical fusion EXAM: CERVICAL SPINE - 2-3 VIEW; DG C-ARM 1-60 MIN COMPARISON:  MRI 08/08/2019 FINDINGS: Single lateral intraoperative fluoroscopic image depicts interval placement of a posterior C3-C6  spinal fusion, partially obscured by overlying metallic retractors. Intubation noted at time of examination. No acute hardware complication is evident. Advanced spondylitic changes in the imaged cervical spine are similar to comparisons. Diminished kyphotic curvature of the cervical spine compared to prior MR. IMPRESSION: Intraoperative fluoroscopic image during interval placement of a posterior C3-C6 spinal fusion. No evidence of acute hardware complication though partially obscured by overlying surgical implements. Electronically Signed   By: Lovena Le M.D.   On: 08/08/2019 23:41   ECHOCARDIOGRAM COMPLETE  Result Date: 08/09/2019    ECHOCARDIOGRAM REPORT   Patient Name:   Monica Eaton Date of Exam: 08/09/2019 Medical Rec #:  [id number]         Height:       67.0 in Accession #:    [id number]        Weight:       134.9 lb Date of Birth:  1975-02-11         BSA:          70.711 m Patient Age:    82 years          BP:           122/74 mmHg Patient Gender: F                 HR:           91 bpm. Exam Location:  Inpatient Procedure: 2D Echo, Cardiac Doppler, Color Doppler and 3D Echo Indications:    Bacteremia  History:        Patient has no prior history of Echocardiogram examinations.                 Signs/Symptoms:Bacteremia; Risk Factors:Current Smoker. IVDU.  Sonographer:    Roseanna Rainbow RDCS Referring Phys: [id number] [facility]  Sonographer Comments: Technically difficult study due to poor echo windows. Patient in fetal position and could not move due to extreme neck pain from neck surgery on previuos day. IMPRESSIONS  1. Left ventricular ejection fraction, by estimation, is 60 to 65%. The left ventricle has normal function. The left ventricle has no regional wall motion abnormalities. Left ventricular diastolic parameters were normal.  2. Right ventricular systolic function is normal. The right ventricular size is normal.  3. The mitral valve is normal in structure. Trivial  mitral valve regurgitation. No  evidence of mitral stenosis.  4. The aortic valve is tricuspid. Aortic valve regurgitation is not visualized. No aortic stenosis is present.  5. The inferior vena cava is normal in size with greater than 50% respiratory variability, suggesting right atrial pressure of 3 mmHg. FINDINGS  Left Ventricle: Left ventricular ejection fraction, by estimation, is 60 to 65%. The left ventricle has normal function. The left ventricle has no regional wall motion abnormalities. The left ventricular internal cavity size was normal in size. There is  no left ventricular hypertrophy. Left ventricular diastolic parameters were normal. Right Ventricle: The right ventricular size is normal.Right ventricular systolic function is normal. Left Atrium: Left atrial size was normal in size. Right Atrium: Right atrial size was normal in size. Pericardium: There is no evidence of pericardial effusion. Mitral Valve: The mitral valve is normal in structure. Normal mobility of the mitral valve leaflets. Trivial mitral valve regurgitation. No evidence of mitral valve stenosis. Tricuspid Valve: The tricuspid valve is normal in structure. Tricuspid valve regurgitation is trivial. No evidence of tricuspid stenosis. Aortic Valve: The aortic valve is tricuspid. Aortic valve regurgitation is not visualized. No aortic stenosis is present. Pulmonic Valve: The pulmonic valve was normal in structure. Pulmonic valve regurgitation is not visualized. No evidence of pulmonic stenosis. Aorta: The aortic root is normal in size and structure. Venous: The inferior vena cava is normal in size with greater than 50% respiratory variability, suggesting right atrial pressure of 3 mmHg. IAS/Shunts: No atrial level shunt detected by color flow Doppler.  LEFT VENTRICLE PLAX 2D LVIDd:         4.60 cm     Diastology LVIDs:         3.10 cm     LV e' lateral:   13.30 cm/s LV PW:         1.30 cm     LV E/e' lateral: 7.2 LV IVS:        1.10 cm     LV e' medial:    9.90 cm/s  LVOT diam:     1.80 cm     LV E/e' medial:  9.6 LV SV:         55 LV SV Index:   32 LVOT Area:     2.54 cm  LV Volumes (MOD) LV vol d, MOD A2C: 40.5 ml LV vol d, MOD A4C: 85.3 ml LV vol s, MOD A2C: 16.1 ml LV vol s, MOD A4C: 26.4 ml LV SV MOD A2C:     24.4 ml LV SV MOD A4C:     85.3 ml LV SV MOD BP:      43.5 ml RIGHT VENTRICLE             IVC RV S prime:     11.10 cm/s  IVC diam: 2.20 cm TAPSE (M-mode): 2.1 cm LEFT ATRIUM             Index       RIGHT ATRIUM           Index LA diam:        3.40 cm 1.99 cm/m  RA Area:     11.40 cm LA Vol (A2C):   37.7 ml 22.04 ml/m RA Volume:   25.20 ml  14.73 ml/m LA Vol (A4C):   29.8 ml 17.42 ml/m LA Biplane Vol: 33.3 ml 19.47 ml/m  AORTIC VALVE LVOT Vmax:   119.00 cm/s LVOT Vmean:  90.200 cm/s LVOT VTI:  0.215 m  AORTA Ao Root diam: 2.90 cm Ao Asc diam:  3.00 cm MITRAL VALVE MV Area (PHT): 3.36 cm    SHUNTS MV Decel Time: 226 msec    Systemic VTI:  0.22 m MV E velocity: 95.50 cm/s  Systemic Diam: 1.80 cm MV A velocity: 61.30 cm/s MV E/A ratio:  1.56 Kirk Ruths MD Electronically signed by Kirk Ruths MD Signature Date/Time: 08/09/2019/3:50:14 PM    Final         Scheduled Meds: . Chlorhexidine Gluconate Cloth  6 each Topical Daily  . cloNIDine  0.1 mg Oral QID   Followed by  . [START ON 08/11/2019] cloNIDine  0.1 mg Oral BH-qamhs   Followed by  . [START ON 08/13/2019] cloNIDine  0.1 mg Oral QAC breakfast  . enoxaparin (LOVENOX) injection  40 mg Subcutaneous Q24H  . nicotine  14 mg Transdermal Daily   Continuous Infusions: . sodium chloride 1,000 mL (08/10/19 0041)  . methocarbamol (ROBAXIN) IV    . vancomycin 1,000 mg (08/10/19 0546)          Aline August, MD [facility] 08/10/2019, 8:02 AM

## 2019-08-10 NOTE — Progress Notes (Signed)
Subjective:  Complaining of neck pain and shoulder pain   Antibiotics:  Anti-infectives (From admission, onward)   Start     Dose/Rate Route Frequency Ordered Stop   08/08/19 2042  bacitracin 50,000 Units in sodium chloride 0.9 % 500 mL irrigation  Status:  Discontinued          As needed 08/08/19 2043 08/08/19 2350   08/08/19 1800  vancomycin (VANCOCIN) IVPB 1000 mg/200 mL premix  Status:  Discontinued        1,000 mg 200 mL/hr over 60 Minutes Intravenous Every 12 hours 08/08/19 0644 08/10/19 0919   08/08/19 0800  levofloxacin (LEVAQUIN) IVPB 750 mg  Status:  Discontinued        750 mg 100 mL/hr over 90 Minutes Intravenous Every 24 hours 08/08/19 0644 08/09/19 0115   08/08/19 0645  vancomycin (VANCOREADY) IVPB 1250 mg/250 mL        1,250 mg 166.7 mL/hr over 90 Minutes Intravenous  Once 08/08/19 4709 08/08/19 0916      Medications: Scheduled Meds: . Chlorhexidine Gluconate Cloth  6 each Topical Daily  . cloNIDine  0.1 mg Oral QID   Followed by  . [START ON 08/11/2019] cloNIDine  0.1 mg Oral BH-qamhs   Followed by  . [START ON 08/13/2019] cloNIDine  0.1 mg Oral QAC breakfast  . enoxaparin (LOVENOX) injection  40 mg Subcutaneous Q24H  . nicotine  14 mg Transdermal Daily   Continuous Infusions: . sodium chloride 1,000 mL (08/10/19 1010)   PRN Meds:.acetaminophen **OR** acetaminophen, dicyclomine, HYDROmorphone (DILAUDID) injection, hydrOXYzine, loperamide, methocarbamol, naproxen, ondansetron, oxyCODONE    Objective: Weight change:   Intake/Output Summary (Last 24 hours) at 08/10/2019 1215 Last data filed at 08/10/2019 1102 Gross per 24 hour  Intake 3028.8 ml  Output 475 ml  Net 2553.8 ml   Blood pressure 121/75, pulse 83, temperature 98.1 F (36.7 C), temperature source Oral, resp. rate 16, height 5\' 7"  (1.702 m), weight 61.2 kg, SpO2 100 %. Temp:  [98.1 F (36.7 C)-98.9 F (37.2 C)] 98.1 F (36.7 C) (07/04 1117) Pulse Rate:  [73-99] 83 (07/04 1117) Resp:   [16-20] 16 (07/04 1117) BP: (101-121)/(56-75) 121/75 (07/04 1117) SpO2:  [98 %-100 %] 100 % (07/04 1117)  Physical Exam: General: Alert and awake, oriented x3, still appears uncomfortable HEENT: anicteric sclera, EOMI CVS regular rate, normal no murmurs gallops or rubs heard Chest: , no wheezing, no respiratory distress clear to auscultation bilaterally Abdomen: soft non-distended,  Extremities: no edema or deformity noted bilaterally Skin: no rashes Neuro: nonfocal  CBC:    BMET Recent Labs    08/09/19 0336 08/10/19 0704  NA 136 136  K 3.8 3.5  CL 100 104  CO2 26 23  GLUCOSE 141* 162*  BUN 6 7  CREATININE 0.54 0.48  CALCIUM 8.8* 8.0*     Liver Panel  Recent Labs    08/08/19 0007 08/09/19 0336  PROT 8.2* 6.2*  ALBUMIN 4.5 2.8*  AST 30 18  ALT 22 16  ALKPHOS 89 62  BILITOT 0.8 0.8       Sedimentation Rate Recent Labs    08/10/19 0704  ESRSEDRATE 65*   C-Reactive Protein Recent Labs    08/09/19 0336 08/10/19 0704  CRP 28.9* 14.4*    Micro Results: Recent Results (from the past 720 hour(s))  Culture, blood (Routine X 2) w Reflex to ID Panel     Status: Abnormal   Collection Time: 08/08/19  7:27 AM  Specimen: BLOOD RIGHT FOREARM  Result Value Ref Range Status   Specimen Description BLOOD RIGHT FOREARM  Final   Special Requests   Final    BOTTLES DRAWN AEROBIC AND ANAEROBIC Blood Culture results may not be optimal due to an inadequate volume of blood received in culture bottles   Culture  Setup Time   Final    IN BOTH AEROBIC AND ANAEROBIC BOTTLES GRAM POSITIVE COCCI IN CLUSTERS CRITICAL RESULT CALLED TO, READ BACK BY AND VERIFIED WITH: Karsten Ro [facility] 08/09/19 0102 JDW Performed at [facility], [place] [address] [place], [place] [address]    Culture STAPHYLOCOCCUS AUREUS (A)  Final   Report Status 08/10/2019 FINAL  Final   Organism ID, Bacteria STAPHYLOCOCCUS AUREUS  Final      Susceptibility   Staphylococcus aureus - MIC*     CIPROFLOXACIN <=0.5 SENSITIVE Sensitive     ERYTHROMYCIN >=8 RESISTANT Resistant     GENTAMICIN <=0.5 SENSITIVE Sensitive     OXACILLIN 0.5 SENSITIVE Sensitive     TETRACYCLINE <=1 SENSITIVE Sensitive     VANCOMYCIN <=0.5 SENSITIVE Sensitive     TRIMETH/SULFA <=10 SENSITIVE Sensitive     CLINDAMYCIN <=0.25 SENSITIVE Sensitive     RIFAMPIN <=0.5 SENSITIVE Sensitive     Inducible Clindamycin NEGATIVE Sensitive     * STAPHYLOCOCCUS AUREUS  Blood Culture ID Panel (Reflexed)     Status: Abnormal   Collection Time: 08/08/19  7:27 AM  Result Value Ref Range Status   Enterococcus species NOT DETECTED NOT DETECTED Final   Listeria monocytogenes NOT DETECTED NOT DETECTED Final   Staphylococcus species DETECTED (A) NOT DETECTED Final    Comment: CRITICAL RESULT CALLED TO, READ BACK BY AND VERIFIED WITH: J LEDFORD PHARMD 08/09/19 0102 JDW    Staphylococcus aureus (BCID) DETECTED (A) NOT DETECTED Final    Comment: Methicillin (oxacillin) susceptible Staphylococcus aureus (MSSA). Preferred therapy is anti staphylococcal beta lactam antibiotic (Cefazolin or Nafcillin), unless clinically contraindicated. CRITICAL RESULT CALLED TO, READ BACK BY AND VERIFIED WITH: J LEDFORD [facility] 08/09/19 0102 JDW    Methicillin resistance NOT DETECTED NOT DETECTED Final   Streptococcus species NOT DETECTED NOT DETECTED Final   Streptococcus agalactiae NOT DETECTED NOT DETECTED Final   Streptococcus pneumoniae NOT DETECTED NOT DETECTED Final   Streptococcus pyogenes NOT DETECTED NOT DETECTED Final   Acinetobacter baumannii NOT DETECTED NOT DETECTED Final   Enterobacteriaceae species NOT DETECTED NOT DETECTED Final   Enterobacter cloacae complex NOT DETECTED NOT DETECTED Final   Escherichia coli NOT DETECTED NOT DETECTED Final   Klebsiella oxytoca NOT DETECTED NOT DETECTED Final   Klebsiella pneumoniae NOT DETECTED NOT DETECTED Final   Proteus species NOT DETECTED NOT DETECTED Final   Serratia marcescens NOT DETECTED  NOT DETECTED Final   Haemophilus influenzae NOT DETECTED NOT DETECTED Final   Neisseria meningitidis NOT DETECTED NOT DETECTED Final   Pseudomonas aeruginosa NOT DETECTED NOT DETECTED Final   Candida albicans NOT DETECTED NOT DETECTED Final   Candida glabrata NOT DETECTED NOT DETECTED Final   Candida krusei NOT DETECTED NOT DETECTED Final   Candida parapsilosis NOT DETECTED NOT DETECTED Final   Candida tropicalis NOT DETECTED NOT DETECTED Final    Comment: Performed at [facility], [place]. [address], [place], [place] [address]  SARS Coronavirus 2 by RT PCR (hospital order, performed in [facility] hospital lab) Nasopharyngeal Nasopharyngeal Swab     Status: None   Collection Time: 08/08/19  9:02 AM   Specimen: Nasopharyngeal Swab  Result Value Ref Range Status   SARS Coronavirus 2 NEGATIVE NEGATIVE Final    Comment: (NOTE) SARS-CoV-2 target nucleic acids are NOT DETECTED.  The SARS-CoV-2 RNA is generally detectable in upper and lower respiratory specimens during the acute phase of infection. The lowest concentration of SARS-CoV-2 viral copies this assay can detect is 250 copies / mL. A negative result does not preclude SARS-CoV-2 infection and should not be used as the sole basis for treatment or other patient management decisions.  A negative result may occur with improper specimen collection / handling, submission of specimen other than nasopharyngeal swab, presence of viral mutation(s) within the areas targeted by this assay, and inadequate number of viral copies (<250 copies / mL). A negative result must be combined with clinical observations, patient history, and epidemiological information.  Fact Sheet for Patients:   StrictlyIdeas.no  Fact Sheet for Healthcare Providers: BankingDealers.co.za  This test is not yet approved or  cleared by the [place] FDA and has been authorized for detection and/or diagnosis of  SARS-CoV-2 by FDA under an Emergency Use Authorization (EUA).  This EUA will remain in effect (meaning this test can be used) for the duration of the COVID-19 declaration under Section 564(b)(1) of the Act, 21 U.S.C. section 360bbb-3(b)(1), unless the authorization is terminated or revoked sooner.  Performed at [facility], [place] [address] [place], [place] [address]   Culture, blood (Routine X 2) w Reflex to ID Panel     Status: Abnormal (Preliminary result)   Collection Time: 08/08/19  5:11 PM   Specimen: BLOOD RIGHT HAND  Result Value Ref Range Status   Specimen Description BLOOD RIGHT HAND  Final   Special Requests   Final    BOTTLES DRAWN AEROBIC AND ANAEROBIC Blood Culture adequate volume   Culture  Setup Time   Final    GRAM POSITIVE COCCI IN CLUSTERS IN BOTH AEROBIC AND ANAEROBIC BOTTLES CRITICAL RESULT CALLED TO, READ BACK BY AND VERIFIED WITH: J. LEDFORD,PHARMD 0503 08/09/2019 Mena Goes Performed at [facility], [place] [address] [place], [place] [address]    Culture STAPHYLOCOCCUS AUREUS (A)  Final   Report Status PENDING  Incomplete    Studies/Results: DG Cervical Spine 2-3 Views  Result Date: 08/08/2019 CLINICAL DATA:  Posterior cervical fusion EXAM: CERVICAL SPINE - 2-3 VIEW; DG C-ARM 1-60 MIN COMPARISON:  MRI 08/08/2019 FINDINGS: Single lateral intraoperative fluoroscopic image depicts interval placement of a posterior C3-C6 spinal fusion, partially obscured by overlying metallic retractors. Intubation noted at time of examination. No acute hardware complication is evident. Advanced spondylitic changes in the imaged cervical spine are similar to comparisons. Diminished kyphotic curvature of the cervical spine compared to prior MR. IMPRESSION: Intraoperative fluoroscopic image during interval placement of a posterior C3-C6 spinal fusion. No evidence of acute hardware complication though partially obscured by overlying surgical implements. Electronically Signed    By: Lovena Le M.D.   On: 08/08/2019 23:41   MR CERVICAL SPINE W WO CONTRAST  Addendum Date: 08/08/2019   ADDENDUM REPORT: 08/08/2019 17:15 ADDENDUM: Study discussed by telephone with Dr. Aline August on 08/08/2019 at 1705 hours. He advises that the patient has known IV drug abuse and therefore I do strongly suspect cervical spine infection as the acute diagnosis. And furthermore, the underlying multilevel advanced chronic cervical spine degeneration might be the sequelae of previous discitis rather than entirely degenerative in etiology from the C2-C3 segmentation anomaly. Electronically Signed   By: Genevie Ann M.D.   On:  08/08/2019 17:15   Result Date: 08/08/2019 CLINICAL DATA:  44 year old female with neck and upper back pain. Prevertebral fluid on noncontrast cervical spine MRI this morning. Epidural abscess suspected. EXAM: MRI CERVICAL SPINE WITHOUT AND WITH CONTRAST TECHNIQUE: Multiplanar and multiecho pulse sequences of the cervical spine, to include the craniocervical junction and cervicothoracic junction, were obtained without and with intravenous contrast. CONTRAST:  96mL GADAVIST GADOBUTROL 1 MMOL/ML IV SOLN COMPARISON:  Limited noncontrast cervical MRI 0352 hours today. FINDINGS: Alignment: Reversed cervical lordosis with anterolisthesis of C3 on C4 measuring 3 mm. Straightening of lower cervical lordosis. 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 Sclerotic appearance of both the C5 and C6 vertebral bodies, and there is faint marrow edema and enhancement in the left C5 body (series 5, image 14). Similar subtle marrow edema suspected in the left C4 body (image 13). Superimposed abnormal fluid signal in the C5-C6 disc space eccentric to the right (series 5, image 10). Although no endplate enhancement. And although there is a small volume of prevertebral fluid this is primarily at the C3 and C4 levels. See series 8, image 17). Heterogeneous marrow signal also  in the C7 vertebra, elsewhere bone marrow signal appears homogeneous and within normal limits. Cord: There is spinal stenosis and spinal cord mass effect from C3-C4 through C6-C7, with questionable abnormal T2 and STIR heterogeneity within the cord (see series 5, image 12). Furthermore, there is widespread smooth circumferential dural thickening and enhancement throughout the cervical spine, most pronounced at C4 (series 35 image 9 and series 8, image 17). No enhancement of the substance of the spinal cord. The dural thickening seems to abate into the upper thoracic spine. Posterior Fossa, vertebral arteries, paraspinal tissues: Cervicomedullary junction is within normal limits. Negative visible posterior fossa. Preserved major vascular flow voids in the neck and at the skull base. Abnormal prevertebral fluid described above, and the longus coli muscles appear mildly enlarged and heterogeneous down to the C6 level. And there also is trace abnormal STIR signal in the C4-C5 interspinous ligament on series 5, image 12. There is an oropharyngeal airway in place with trace surrounding retained secretions. Other visible neck soft tissues appear negative. Negative visible lung apices. Disc levels: C2-C3: Congenital incomplete segmentation. Ventral epidural venous prominence and/or dural thickening. Ligament flavum hypertrophy, but no significant stenosis. C3-C4: Degenerative appearing anterolisthesis with circumferential disc bulge, endplate spurring and at least moderate facet hypertrophy on the left. Multifactorial spinal stenosis with mild to moderate spinal cord mass effect. Moderate to severe bilateral C4 foraminal stenosis. C4-C5: Severe disc space loss with circumferential disc osteophyte complex. Multifactorial spinal stenosis with mild to moderate spinal cord mass effect. Moderate left and moderate to severe right C5 foraminal stenosis. C5-C6: Severe disc space loss with trace fluid in the disc space.  Circumferential disc osteophyte complex contributing to multifactorial moderate spinal stenosis and spinal cord mass effect. Moderate left and moderate to severe right C6 foraminal stenosis. C6-C7: Severe disc space loss with circumferential disc osteophyte complex. Broad-based posterior component contributing to mild spinal stenosis with spinal cord mass effect. Moderate right greater than left C7 foraminal stenosis. C7-T1: Relatively preserved disc space at this level. Moderate posterior element hypertrophy including trace facet joint fluid on the left. No significant stenosis. T1-T2: Negative. IMPRESSION: 1. 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 Trace fluid in the C5-C6 disc space  suspicious for Discitis. Prevertebral fluid and longus coli muscle edema such that an alternative diagnosis of Calcific Tendinosis Of The Longus Coli Muscles was considered, but dismissed due to the generalized cervical dural thickening and enhancement. The main differential diagnosis is sequelae of acute cervical spine trauma superimposed on chronic degeneration. And for that consideration a follow-up noncontrast Cervical Spine CT may be valuable. 2. Up to moderate multifactorial cervical spinal stenosis C3-C4 through C6-C7. Spinal cord mass effect with questionable mildly abnormal cord signal on T2/STIR - such as due to cord edema or developing myelomalacia. 3. Extensive sclerotic signal in the C5 and C6 vertebrae favored to be degenerative, with no other evidence of osseous metastatic disease in the spine (see Thoracic And Lumbar MRIs today reported separately). Electronically Signed: By: Genevie Ann M.D. On: 08/08/2019 16:59   MR THORACIC SPINE W WO CONTRAST  Result Date: 08/08/2019 CLINICAL DATA:  44 year old female with neck and upper back pain. Prevertebral  fluid on noncontrast cervical spine MRI this morning. Epidural abscess suspected. EXAM: MRI THORACIC WITHOUT AND WITH CONTRAST TECHNIQUE: Multiplanar and multiecho pulse sequences of the thoracic spine were obtained without and with intravenous contrast. CONTRAST:  68mL GADAVIST GADOBUTROL 1 MMOL/ML IV SOLN in conjunction with contrast enhanced imaging of the cervical spine reported separately. COMPARISON:  Cervical spine MRI today reported separately. FINDINGS: Limited cervical spine imaging: Cervical MRI reported separately today. Thoracic spine segmentation:  Appears normal. Alignment:  Preserved thoracic vertebral height and alignment. Vertebrae: No marrow edema or evidence of acute osseous abnormality. Visualized bone marrow signal is within normal limits. Occasional benign vertebral hemangiomas, such as in the left T10 posterior elements. Cord: Thoracic spinal cord and conus are within normal limits. On sagittal postcontrast series 26 faint leptomeningeal enhancement is felt to be related to physiologic vasculature, and not correlated with leptomeningeal abnormality on the axial images. Fairly capacious thoracic spinal canal. No thoracic dural thickening or enhancement. Paraspinal and other soft tissues: Partially visible oral airway. Trace dependent atelectasis in both lungs. Otherwise negative visible thoracic and upper abdominal viscera. Thoracic paraspinal soft tissues are within normal limits. Disc levels: Minimal thoracic spine degeneration, such as subtle disc bulging at T3-T4. Capacious thoracic spinal canal with no spinal or foraminal stenosis. IMPRESSION: 1. Normal for age MRI appearance of the thoracic spine. 2. See cervical spine MRI today reported separately. Electronically Signed   By: Genevie Ann M.D.   On: 08/08/2019 17:03   MR Lumbar Spine W Wo Contrast  Result Date: 08/08/2019 CLINICAL DATA:  44 year old female with neck and upper back pain. Prevertebral fluid on noncontrast cervical spine MRI  this morning. Epidural abscess suspected. EXAM: MRI LUMBAR SPINE WITHOUT AND WITH CONTRAST TECHNIQUE: Multiplanar and multiecho pulse sequences of the lumbar spine were obtained without and with intravenous contrast. CONTRAST:  80mL GADAVIST GADOBUTROL 1 MMOL/ML IV SOLN in conjunction with contrast enhanced imaging of the cervical and thoracic reported separately. COMPARISON:  Cervical and thoracic MRI today reported separately. FINDINGS: Segmentation: Normal, concordant with the thoracic spine numbering today. Alignment:  Preserved lumbar lordosis.  No spondylolisthesis. Vertebrae: No marrow edema or evidence of acute osseous abnormality. Visualized bone marrow signal is within normal limits. Intact visible sacrum and SI joints. Conus medullaris and cauda equina: Conus extends to the L1 level. No lower spinal cord or conus signal abnormality. As suspected on the thoracic spine MRI, there is no convincing abnormal intradural enhancement of the lower thoracic spinal cord or conus on these images. Cauda equina nerve roots appear  normal. No lumbar dural thickening or enhancement. Paraspinal and other soft tissues: Negative. Disc levels: L1-L2: Negative. L2-L3: Disc desiccation and disc space loss with broad-based right lateral recess disc extrusion (series 22, image 17 and series 19, image 7). Mild facet hypertrophy. Moderate right lateral recess stenosis (right L3 nerve level). No significant spinal stenosis or foraminal involvement. L3-L4: Mild disc desiccation with broad-based left paracentral and more focal suspected left foraminal disc protrusion (series 22, image 23). However, no spinal or lateral recess stenosis, and mild if any left L3 foraminal stenosis. L4-L5: Disc desiccation with mild disc bulging and similar more focal left foraminal disc protrusion. Mild to moderate facet hypertrophy with trace facet joint fluid. No spinal or lateral recess stenosis. Mild left L4 foraminal stenosis. L5-S1:  Mild facet  hypertrophy.  Otherwise negative. IMPRESSION: 1. 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 See also Cervical Spine MRI reported separately. 2. 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 Small left foraminal disc herniations at L3-L4 and L4-L5. Electronically Signed   By: Genevie Ann M.D.   On: 08/08/2019 17:12   DG C-Arm 1-60 Min  Result Date: 08/08/2019 CLINICAL DATA:  Posterior cervical fusion EXAM: CERVICAL SPINE - 2-3 VIEW; DG C-ARM 1-60 MIN COMPARISON:  MRI 08/08/2019 FINDINGS: Single lateral intraoperative fluoroscopic image depicts interval placement of a posterior C3-C6 spinal fusion, partially obscured by overlying metallic retractors. Intubation noted at time of examination. No acute hardware complication is evident. Advanced spondylitic changes in the imaged cervical spine are similar to comparisons. Diminished kyphotic curvature of the cervical spine compared to prior MR. IMPRESSION: Intraoperative fluoroscopic image during interval placement of a posterior C3-C6 spinal fusion. No evidence of acute hardware complication though partially obscured by overlying surgical implements. Electronically Signed   By: Lovena Le M.D.   On: 08/08/2019 23:41   ECHOCARDIOGRAM COMPLETE  Result Date: 08/09/2019    ECHOCARDIOGRAM REPORT   Patient Name:   ABREANNA DRAWDY Date of Exam: 08/09/2019 Medical Rec #:  [id number]         Height:       67.0 in Accession #:    [id number]        Weight:       134.9 lb Date of Birth:  October 19, 1975         BSA:          17.711 m Patient Age:    45 years          BP:           122/74 mmHg Patient Gender: F                 HR:           91 bpm. Exam Location:  Inpatient Procedure: 2D Echo, Cardiac Doppler, Color Doppler and 3D Echo Indications:    Bacteremia  History:        Patient has no prior history of Echocardiogram examinations.                  Signs/Symptoms:Bacteremia; Risk Factors:Current Smoker. IVDU.  Sonographer:    Roseanna Rainbow RDCS Referring Phys: [id number] [facility]  Sonographer Comments: Technically difficult study due to poor echo windows. Patient in fetal position and could not move due to extreme neck pain from neck surgery on previuos day. IMPRESSIONS  1. Left ventricular ejection fraction, by estimation, is 60 to 65%. The left ventricle has  normal function. The left ventricle has no regional wall motion abnormalities. Left ventricular diastolic parameters were normal.  2. Right ventricular systolic function is normal. The right ventricular size is normal.  3. The mitral valve is normal in structure. Trivial mitral valve regurgitation. No evidence of mitral stenosis.  4. The aortic valve is tricuspid. Aortic valve regurgitation is not visualized. No aortic stenosis is present.  5. The inferior vena cava is normal in size with greater than 50% respiratory variability, suggesting right atrial pressure of 3 mmHg. FINDINGS  Left Ventricle: Left ventricular ejection fraction, by estimation, is 60 to 65%. The left ventricle has normal function. The left ventricle has no regional wall motion abnormalities. The left ventricular internal cavity size was normal in size. There is  no left ventricular hypertrophy. Left ventricular diastolic parameters were normal. Right Ventricle: The right ventricular size is normal.Right ventricular systolic function is normal. Left Atrium: Left atrial size was normal in size. Right Atrium: Right atrial size was normal in size. Pericardium: There is no evidence of pericardial effusion. Mitral Valve: The mitral valve is normal in structure. Normal mobility of the mitral valve leaflets. Trivial mitral valve regurgitation. No evidence of mitral valve stenosis. Tricuspid Valve: The tricuspid valve is normal in structure. Tricuspid valve regurgitation is trivial. No evidence of tricuspid stenosis. Aortic Valve: The aortic  valve is tricuspid. Aortic valve regurgitation is not visualized. No aortic stenosis is present. Pulmonic Valve: The pulmonic valve was normal in structure. Pulmonic valve regurgitation is not visualized. No evidence of pulmonic stenosis. Aorta: The aortic root is normal in size and structure. Venous: The inferior vena cava is normal in size with greater than 50% respiratory variability, suggesting right atrial pressure of 3 mmHg. IAS/Shunts: No atrial level shunt detected by color flow Doppler.  LEFT VENTRICLE PLAX 2D LVIDd:         4.60 cm     Diastology LVIDs:         3.10 cm     LV e' lateral:   13.30 cm/s LV PW:         1.30 cm     LV E/e' lateral: 7.2 LV IVS:        1.10 cm     LV e' medial:    9.90 cm/s LVOT diam:     1.80 cm     LV E/e' medial:  9.6 LV SV:         55 LV SV Index:   32 LVOT Area:     2.54 cm  LV Volumes (MOD) LV vol d, MOD A2C: 40.5 ml LV vol d, MOD A4C: 85.3 ml LV vol s, MOD A2C: 16.1 ml LV vol s, MOD A4C: 26.4 ml LV SV MOD A2C:     24.4 ml LV SV MOD A4C:     85.3 ml LV SV MOD BP:      43.5 ml RIGHT VENTRICLE             IVC RV S prime:     11.10 cm/s  IVC diam: 2.20 cm TAPSE (M-mode): 2.1 cm LEFT ATRIUM             Index       RIGHT ATRIUM           Index LA diam:        3.40 cm 1.99 cm/m  RA Area:     11.40 cm LA Vol (A2C):   37.7 ml 22.04 ml/m RA Volume:  25.20 ml  14.73 ml/m LA Vol (A4C):   29.8 ml 17.42 ml/m LA Biplane Vol: 33.3 ml 19.47 ml/m  AORTIC VALVE LVOT Vmax:   119.00 cm/s LVOT Vmean:  90.200 cm/s LVOT VTI:    0.215 m  AORTA Ao Root diam: 2.90 cm Ao Asc diam:  3.00 cm MITRAL VALVE MV Area (PHT): 3.36 cm    SHUNTS MV Decel Time: 226 msec    Systemic VTI:  0.22 m MV E velocity: 95.50 cm/s  Systemic Diam: 1.80 cm MV A velocity: 61.30 cm/s MV E/A ratio:  1.56 Kirk Ruths MD Electronically signed by Kirk Ruths MD Signature Date/Time: 08/09/2019/3:50:14 PM    Final       Assessment/Plan:  INTERVAL HISTORY: She is remained on vancomycin due to the fact that she has  a penicillin allergy and cephalosporin allergy the nature of these is not clear   Principal Problem:   Cervical pain (neck) Active Problems:   IV drug abuse (HCC)   Tobacco abuse   Neck pain    Rio Taber is a 44 y.o. 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  She also had a allergic reaction to a cephalosporin but she cannot remember what the nature of the allergy was.  I have tried to call her father based on the number that is in epic but he is not picking up.  Hopefully we can challenge her with a beta-lactam as it would be infinitely preferable to continuing vancomycin  Replete blood cultures have been sent  2D echocardiogram does not show vegetations.  Neurosurgery is okay with TEE that I suspect given the amount of pain she has she might need general anesthesia for this.  She did have some headaches and I was considering MRI of the head but headaches are improving right now and I do not think she will tolerate being in an MRI machine  As mentioned yesterday ideally she needs 6 to 8 weeks of systemic antibiotics.  I would add rifampin once we have blood cultures negative at 5 days.  She will need long-term oral antibiotics given placement of hardware.  2.  IV drug use needs a plan for this long-term.  She is HIV negative hep B and hep C negative.  I will check hepatitis B surface antibodies if she does not have them she would that benefit the being vaccinated against hep a and B    LOS: 2 days   Alcide Evener 08/10/2019, 12:15 PM

## 2019-08-10 NOTE — Progress Notes (Signed)
Neurosurgery Service Progress Note  Subjective: No acute events overnight, strength subjectively improved, pain / numbness slowly improving   Objective: Vitals:   08/09/19 1735 08/09/19 1946 08/09/19 2330 08/10/19 0455  BP: 113/69 106/71 (!) 101/56 116/74  Pulse:  99 82 80  Resp:  16 20 16   Temp:  98.6 F (37 C) 98.9 F (37.2 C) 98.4 F (36.9 C)  TempSrc:  Oral Oral Oral  SpO2:  98%    Weight:      Height:       Temp (24hrs), Avg:98.6 F (37 C), Min:98.3 F (36.8 C), Max:98.9 F (37.2 C)  CBC Latest Ref Rng & Units 08/09/2019 08/08/2019 08/08/2019  WBC 4.0 - 10.5 K/uL 18.6(H) 18.9(H) 19.4(H)  Hemoglobin 12.0 - 15.0 g/dL 10.6(L) 11.8(L) 12.0  Hematocrit 36 - 46 % 32.6(L) 36.3 35.8(L)  Platelets 150 - 400 K/uL 296 344 347   BMP Latest Ref Rng & Units 08/09/2019 08/08/2019 08/08/2019  Glucose 70 - 99 mg/dL 141(H) - 143(H)  BUN 6 - 20 mg/dL 6 - 11  Creatinine 0.44 - 1.00 mg/dL 0.54 0.55 0.49  Sodium 135 - 145 mmol/L 136 - 136  Potassium 3.5 - 5.1 mmol/L 3.8 - 3.5  Chloride 98 - 111 mmol/L 100 - 99  CO2 22 - 32 mmol/L 26 - 28  Calcium 8.9 - 10.3 mg/dL 8.8(L) - 9.0    Intake/Output Summary (Last 24 hours) at 08/10/2019 0704 Last data filed at 08/10/2019 0600 Gross per 24 hour  Intake 3028.8 ml  Output 900 ml  Net 2128.8 ml    Current Facility-Administered Medications:  .  [COMPLETED] sodium chloride 0.9 % bolus 1,000 mL, 1,000 mL, Intravenous, Once, Stopped at 08/08/19 0212 **FOLLOWED BY** 0.9 %  sodium chloride infusion, 1,000 mL, Intravenous, Continuous, Alekh, Kshitiz, MD, Last Rate: 75 mL/hr at 08/10/19 0041, 1,000 mL at 08/10/19 0041 .  acetaminophen (TYLENOL) tablet 650 mg, 650 mg, Oral, Q6H PRN, 650 mg at 08/10/19 0124 **OR** acetaminophen (TYLENOL) suppository 650 mg, 650 mg, Rectal, Q6H PRN, Adefeso, Oladapo, DO .  Chlorhexidine Gluconate Cloth 2 % PADS 6 each, 6 each, Topical, Daily, [name], MD, 6 each at 08/09/19 1019 .  cloNIDine (CATAPRES) tablet 0.1 mg, 0.1  mg, Oral, QID, 0.1 mg at 08/09/19 2047 **FOLLOWED BY** [START ON 08/11/2019] cloNIDine (CATAPRES) tablet 0.1 mg, 0.1 mg, Oral, BH-qamhs **FOLLOWED BY** [START ON 08/13/2019] cloNIDine (CATAPRES) tablet 0.1 mg, 0.1 mg, Oral, QAC breakfast, Alekh, Kshitiz, MD .  dicyclomine (BENTYL) tablet 20 mg, 20 mg, Oral, Q6H PRN, Alekh, Kshitiz, MD .  enoxaparin (LOVENOX) injection 40 mg, 40 mg, Subcutaneous, Q24H, Adefeso, Oladapo, DO, 40 mg at 08/09/19 1735 .  HYDROmorphone (DILAUDID) injection 1-2 mg, 1-2 mg, Intravenous, Q2H PRN, Starla Link, Kshitiz, MD, 2 mg at 08/10/19 0545 .  hydrOXYzine (ATARAX/VISTARIL) tablet 25 mg, 25 mg, Oral, Q6H PRN, Starla Link, Kshitiz, MD, 25 mg at 08/09/19 2032 .  loperamide (IMODIUM) capsule 2-4 mg, 2-4 mg, Oral, PRN, Starla Link, Kshitiz, MD .  methocarbamol (ROBAXIN) 500 mg in dextrose 5 % 50 mL IVPB, 500 mg, Intravenous, Q6H PRN, Starla Link, Kshitiz, MD .  methocarbamol (ROBAXIN) tablet 500 mg, 500 mg, Oral, Q8H PRN, Starla Link, Kshitiz, MD, 500 mg at 08/09/19 2032 .  naproxen (NAPROSYN) tablet 500 mg, 500 mg, Oral, BID PRN, Starla Link, Kshitiz, MD, 500 mg at 08/09/19 1240 .  nicotine (NICODERM CQ - dosed in mg/24 hours) patch 14 mg, 14 mg, Transdermal, Daily, Blount, Xenia T, NP, 14 mg at 08/09/19 2046 .  ondansetron (ZOFRAN-ODT) disintegrating tablet 4 mg, 4 mg, Oral, Q6H PRN, Starla Link, Kshitiz, MD .  oxyCODONE (Oxy IR/ROXICODONE) immediate release tablet 5-10 mg, 5-10 mg, Oral, Q4H PRN, Starla Link, Kshitiz, MD, 10 mg at 08/10/19 0123 .  vancomycin (VANCOCIN) IVPB 1000 mg/200 mL premix, 1,000 mg, Intravenous, Q12H, Adefeso, Oladapo, DO, Last Rate: 200 mL/hr at 08/10/19 0546, 1,000 mg at 08/10/19 0546   Physical Exam: Strength diffusely 4/5, diffusely decreased sensation x4, +R hoffman's, reflexes 3+ with ankle clonus, incision c/d/i  Assessment & Plan: 44 y.o. woman s/p C3-7 lami C3-6 PSIF, recovering well.  -activity as tolerated, no collar needed -stenotic disease is decompressed, from a cervical spine  standpoint, I'm okay with her getting a TEE if needed -okay for SQH starting today (7/4)  Monica Eaton  08/10/19 7:04 AM

## 2019-08-11 DIAGNOSIS — R519 Headache, unspecified: Secondary | ICD-10-CM

## 2019-08-11 DIAGNOSIS — G062 Extradural and subdural abscess, unspecified: Secondary | ICD-10-CM

## 2019-08-11 DIAGNOSIS — Z880 Allergy status to penicillin: Secondary | ICD-10-CM

## 2019-08-11 DIAGNOSIS — T847XXA Infection and inflammatory reaction due to other internal orthopedic prosthetic devices, implants and grafts, initial encounter: Secondary | ICD-10-CM

## 2019-08-11 DIAGNOSIS — T847XXD Infection and inflammatory reaction due to other internal orthopedic prosthetic devices, implants and grafts, subsequent encounter: Secondary | ICD-10-CM

## 2019-08-11 DIAGNOSIS — M4642 Discitis, unspecified, cervical region: Secondary | ICD-10-CM

## 2019-08-11 LAB — CULTURE, BLOOD (ROUTINE X 2): Special Requests: ADEQUATE

## 2019-08-11 LAB — CBC WITH DIFFERENTIAL/PLATELET
Abs Immature Granulocytes: 0.06 10*3/uL (ref 0.00–0.07)
Basophils Absolute: 0 10*3/uL (ref 0.0–0.1)
Basophils Relative: 0 %
Eosinophils Absolute: 0 10*3/uL (ref 0.0–0.5)
Eosinophils Relative: 0 %
HCT: 28.5 % — ABNORMAL LOW (ref 36.0–46.0)
Hemoglobin: 9.5 g/dL — ABNORMAL LOW (ref 12.0–15.0)
Immature Granulocytes: 0 %
Lymphocytes Relative: 8 %
Lymphs Abs: 1.1 10*3/uL (ref 0.7–4.0)
MCH: 31 pg (ref 26.0–34.0)
MCHC: 33.3 g/dL (ref 30.0–36.0)
MCV: 93.1 fL (ref 80.0–100.0)
Monocytes Absolute: 1.3 10*3/uL — ABNORMAL HIGH (ref 0.1–1.0)
Monocytes Relative: 9 %
Neutro Abs: 11.6 10*3/uL — ABNORMAL HIGH (ref 1.7–7.7)
Neutrophils Relative %: 83 %
Platelets: 332 10*3/uL (ref 150–400)
RBC: 3.06 MIL/uL — ABNORMAL LOW (ref 3.87–5.11)
RDW: 12.1 % (ref 11.5–15.5)
WBC: 14 10*3/uL — ABNORMAL HIGH (ref 4.0–10.5)
nRBC: 0 % (ref 0.0–0.2)

## 2019-08-11 LAB — BASIC METABOLIC PANEL
Anion gap: 10 (ref 5–15)
BUN: 5 mg/dL — ABNORMAL LOW (ref 6–20)
CO2: 25 mmol/L (ref 22–32)
Calcium: 8.3 mg/dL — ABNORMAL LOW (ref 8.9–10.3)
Chloride: 99 mmol/L (ref 98–111)
Creatinine, Ser: 0.47 mg/dL (ref 0.44–1.00)
GFR calc Af Amer: >60 mL/min (ref >60)
GFR calc non Af Amer: >60 mL/min (ref >60)
Glucose, Bld: 127 mg/dL — ABNORMAL HIGH (ref 70–99)
Potassium: 3.3 mmol/L — ABNORMAL LOW (ref 3.5–5.1)
Sodium: 134 mmol/L — ABNORMAL LOW (ref 135–145)

## 2019-08-11 LAB — MAGNESIUM: Magnesium: 1.8 mg/dL (ref 1.7–2.4)

## 2019-08-11 LAB — C-REACTIVE PROTEIN: CRP: 16.3 mg/dL — ABNORMAL HIGH (ref <1.0)

## 2019-08-11 MED ORDER — POTASSIUM CHLORIDE CRYS ER 20 MEQ PO TBCR
40.0000 meq | EXTENDED_RELEASE_TABLET | Freq: Once | ORAL | Status: AC
  Administered 2019-08-11: 40 meq via ORAL
  Filled 2019-08-11: qty 2

## 2019-08-11 MED ORDER — POLYETHYLENE GLYCOL 3350 17 G PO PACK: 17.0000 g | PACK | Freq: Every day | ORAL | Status: DC | PRN

## 2019-08-11 MED ORDER — CEFAZOLIN SODIUM-DEXTROSE 2-4 GM/100ML-% IV SOLN
2.0000 g | Freq: Three times a day (TID) | INTRAVENOUS | Status: DC
  Administered 2019-08-11 – 2019-09-03 (×69): 2 g via INTRAVENOUS
  Filled 2019-08-11 (×70): qty 100

## 2019-08-11 MED ORDER — SENNOSIDES-DOCUSATE SODIUM 8.6-50 MG PO TABS
1.0000 | ORAL_TABLET | Freq: Two times a day (BID) | ORAL | Status: DC
  Administered 2019-08-11 – 2019-09-08 (×56): 1 via ORAL
  Filled 2019-08-11 (×58): qty 1

## 2019-08-11 MED ORDER — BISACODYL 10 MG RE SUPP: 10.0000 mg | Freq: Every day | RECTAL | Status: DC | PRN

## 2019-08-11 NOTE — Progress Notes (Signed)
Subjective:    Pain is better controlled today  Antibiotics:  Anti-infectives (From admission, onward)   Start     Dose/Rate Route Frequency Ordered Stop   08/11/19 1400  ceFAZolin (ANCEF) IVPB 2g/100 mL premix     Discontinue     2 g 200 mL/hr over 30 Minutes Intravenous Every 8 hours 08/11/19 1159     08/10/19 1400  vancomycin (VANCOREADY) IVPB 750 mg/150 mL  Status:  Discontinued        750 mg 150 mL/hr over 60 Minutes Intravenous Every 8 hours 08/10/19 1254 08/11/19 1159   08/08/19 2042  bacitracin 50,000 Units in sodium chloride 0.9 % 500 mL irrigation  Status:  Discontinued          As needed 08/08/19 2043 08/08/19 2350   08/08/19 1800  vancomycin (VANCOCIN) IVPB 1000 mg/200 mL premix  Status:  Discontinued        1,000 mg 200 mL/hr over 60 Minutes Intravenous Every 12 hours 08/08/19 0644 08/10/19 0919   08/08/19 0800  levofloxacin (LEVAQUIN) IVPB 750 mg  Status:  Discontinued        750 mg 100 mL/hr over 90 Minutes Intravenous Every 24 hours 08/08/19 0644 08/09/19 0115   08/08/19 0645  vancomycin (VANCOREADY) IVPB 1250 mg/250 mL        1,250 mg 166.7 mL/hr over 90 Minutes Intravenous  Once 08/08/19 7902 08/08/19 0916      Medications: Scheduled Meds: . Chlorhexidine Gluconate Cloth  6 each Topical Daily  . cloNIDine  0.1 mg Oral BH-qamhs   Followed by  . [START ON 08/13/2019] cloNIDine  0.1 mg Oral QAC breakfast  . enoxaparin (LOVENOX) injection  40 mg Subcutaneous Q24H  . folic acid  1 mg Oral Daily  . multivitamin with minerals  1 tablet Oral Daily  . nicotine  14 mg Transdermal Daily  . senna-docusate  1 tablet Oral BID  . thiamine  100 mg Oral Daily   Continuous Infusions: .  ceFAZolin (ANCEF) IV     PRN Meds:.acetaminophen **OR** acetaminophen, bisacodyl, dicyclomine, HYDROmorphone (DILAUDID) injection, hydrOXYzine, loperamide, LORazepam **OR** LORazepam, methocarbamol, naproxen, ondansetron, oxyCODONE, polyethylene glycol    Objective: Weight  change:   Intake/Output Summary (Last 24 hours) at 08/11/2019 1228 Last data filed at 08/10/2019 1754 Gross per 24 hour  Intake 150 ml  Output --  Net 150 ml   Blood pressure (!) 148/98, pulse 87, temperature 98.6 F (37 C), temperature source Oral, resp. rate 20, height 5\' 7"  (1.702 m), weight 61.2 kg, SpO2 100 %. Temp:  [97.7 F (36.5 C)-98.9 F (37.2 C)] 98.6 F (37 C) (07/05 0903) Pulse Rate:  [87-96] 87 (07/05 0903) Resp:  [17-20] 20 (07/05 0903) BP: (129-148)/(81-98) 148/98 (07/05 0903) SpO2:  [100 %] 100 % (07/05 0903)  Physical Exam: General: Alert and awake, oriented x3, still appears uncomfortable HEENT: anicteric sclera, EOMI CVS regular rate, normal no murmurs gallops or rubs heard Chest: , no wheezing, no respiratory distress clear to auscultation bilaterally Abdomen: soft non-distended,  Extremities: no edema or deformity noted bilaterally Skin: no rashes Neuro: nonfocal  CBC:    BMET Recent Labs    08/10/19 0704 08/11/19 1000  NA 136 134*  K 3.5 3.3*  CL 104 99  CO2 23 25  GLUCOSE 162* 127*  BUN 7 5*  CREATININE 0.48 0.47  CALCIUM 8.0* 8.3*     Liver Panel  Recent Labs    08/09/19 0336  PROT 6.2*  ALBUMIN 2.8*  AST 18  ALT 16  ALKPHOS 62  BILITOT 0.8       Sedimentation Rate Recent Labs    08/10/19 0704  ESRSEDRATE 65*   C-Reactive Protein Recent Labs    08/10/19 0704 08/11/19 1000  CRP 14.4* 16.3*    Micro Results: Recent Results (from the past 720 hour(s))  Culture, blood (Routine X 2) w Reflex to ID Panel     Status: Abnormal   Collection Time: 08/08/19  7:27 AM   Specimen: BLOOD RIGHT FOREARM  Result Value Ref Range Status   Specimen Description BLOOD RIGHT FOREARM  Final   Special Requests   Final    BOTTLES DRAWN AEROBIC AND ANAEROBIC Blood Culture results may not be optimal due to an inadequate volume of blood received in culture bottles   Culture  Setup Time   Final    IN BOTH AEROBIC AND ANAEROBIC BOTTLES  GRAM POSITIVE COCCI IN CLUSTERS CRITICAL RESULT CALLED TO, READ BACK BY AND VERIFIED WITH: Karsten Ro [facility] 08/09/19 0102 JDW Performed at [facility], [address] [place], [place] [address]    Culture STAPHYLOCOCCUS AUREUS (A)  Final   Report Status 08/10/2019 FINAL  Final   Organism ID, Bacteria STAPHYLOCOCCUS AUREUS  Final      Susceptibility   Staphylococcus aureus - MIC*    CIPROFLOXACIN <=0.5 SENSITIVE Sensitive     ERYTHROMYCIN >=8 RESISTANT Resistant     GENTAMICIN <=0.5 SENSITIVE Sensitive     OXACILLIN 0.5 SENSITIVE Sensitive     TETRACYCLINE <=1 SENSITIVE Sensitive     VANCOMYCIN <=0.5 SENSITIVE Sensitive     TRIMETH/SULFA <=10 SENSITIVE Sensitive     CLINDAMYCIN <=0.25 SENSITIVE Sensitive     RIFAMPIN <=0.5 SENSITIVE Sensitive     Inducible Clindamycin NEGATIVE Sensitive     * STAPHYLOCOCCUS AUREUS  Blood Culture ID Panel (Reflexed)     Status: Abnormal   Collection Time: 08/08/19  7:27 AM  Result Value Ref Range Status   Enterococcus species NOT DETECTED NOT DETECTED Final   Listeria monocytogenes NOT DETECTED NOT DETECTED Final   Staphylococcus species DETECTED (A) NOT DETECTED Final    Comment: CRITICAL RESULT CALLED TO, READ BACK BY AND VERIFIED WITH: J LEDFORD PHARMD 08/09/19 0102 JDW    Staphylococcus aureus (BCID) DETECTED (A) NOT DETECTED Final    Comment: Methicillin (oxacillin) susceptible Staphylococcus aureus (MSSA). Preferred therapy is anti staphylococcal beta lactam antibiotic (Cefazolin or Nafcillin), unless clinically contraindicated. CRITICAL RESULT CALLED TO, READ BACK BY AND VERIFIED WITH: J LEDFORD [facility] 08/09/19 0102 JDW    Methicillin resistance NOT DETECTED NOT DETECTED Final   Streptococcus species NOT DETECTED NOT DETECTED Final   Streptococcus agalactiae NOT DETECTED NOT DETECTED Final   Streptococcus pneumoniae NOT DETECTED NOT DETECTED Final   Streptococcus pyogenes NOT DETECTED NOT DETECTED Final   Acinetobacter baumannii NOT  DETECTED NOT DETECTED Final   Enterobacteriaceae species NOT DETECTED NOT DETECTED Final   Enterobacter cloacae complex NOT DETECTED NOT DETECTED Final   Escherichia coli NOT DETECTED NOT DETECTED Final   Klebsiella oxytoca NOT DETECTED NOT DETECTED Final   Klebsiella pneumoniae NOT DETECTED NOT DETECTED Final   Proteus species NOT DETECTED NOT DETECTED Final   Serratia marcescens NOT DETECTED NOT DETECTED Final   Haemophilus influenzae NOT DETECTED NOT DETECTED Final   Neisseria meningitidis NOT DETECTED NOT DETECTED Final   Pseudomonas aeruginosa NOT DETECTED NOT DETECTED Final   Candida albicans NOT DETECTED NOT DETECTED Final   Candida  glabrata NOT DETECTED NOT DETECTED Final   Candida krusei NOT DETECTED NOT DETECTED Final   Candida parapsilosis NOT DETECTED NOT DETECTED Final   Candida tropicalis NOT DETECTED NOT DETECTED Final    Comment: Performed at [facility], [place] [address] [place], [place] [address]  SARS Coronavirus 2 by RT PCR (hospital order, performed in [facility] hospital lab) Nasopharyngeal Nasopharyngeal Swab     Status: None   Collection Time: 08/08/19  9:02 AM   Specimen: Nasopharyngeal Swab  Result Value Ref Range Status   SARS Coronavirus 2 NEGATIVE NEGATIVE Final    Comment: (NOTE) SARS-CoV-2 target nucleic acids are NOT DETECTED.  The SARS-CoV-2 RNA is generally detectable in upper and lower respiratory specimens during the acute phase of infection. The lowest concentration of SARS-CoV-2 viral copies this assay can detect is 250 copies / mL. A negative result does not preclude SARS-CoV-2 infection and should not be used as the sole basis for treatment or other patient management decisions.  A negative result may occur with improper specimen collection / handling, submission of specimen other than nasopharyngeal swab, presence of viral mutation(s) within the areas targeted by this assay, and inadequate number of viral copies (<250 copies / mL). A  negative result must be combined with clinical observations, patient history, and epidemiological information.  Fact Sheet for Patients:   StrictlyIdeas.no  Fact Sheet for Healthcare Providers: BankingDealers.co.za  This test is not yet approved or  cleared by the [place] FDA and has been authorized for detection and/or diagnosis of SARS-CoV-2 by FDA under an Emergency Use Authorization (EUA).  This EUA will remain in effect (meaning this test can be used) for the duration of the COVID-19 declaration under Section 564(b)(1) of the Act, 21 U.S.C. section 360bbb-3(b)(1), unless the authorization is terminated or revoked sooner.  Performed at [facility], [place] [address], [place], [place] [address]   Culture, blood (Routine X 2) w Reflex to ID Panel     Status: Abnormal   Collection Time: 08/08/19  5:11 PM   Specimen: BLOOD RIGHT HAND  Result Value Ref Range Status   Specimen Description BLOOD RIGHT HAND  Final   Special Requests   Final    BOTTLES DRAWN AEROBIC AND ANAEROBIC Blood Culture adequate volume   Culture  Setup Time   Final    GRAM POSITIVE COCCI IN CLUSTERS IN BOTH AEROBIC AND ANAEROBIC BOTTLES CRITICAL RESULT CALLED TO, READ BACK BY AND VERIFIED WITH: J. LEDFORD,PHARMD 0503 08/09/2019 T. TYSOR    Culture (A)  Final    STAPHYLOCOCCUS AUREUS SUSCEPTIBILITIES PERFORMED ON PREVIOUS CULTURE WITHIN THE LAST 5 DAYS. Performed at [facility], [place] [address], [place], [place] [address]    Report Status 08/11/2019 FINAL  Final  Culture, blood (routine x 2)     Status: None (Preliminary result)   Collection Time: 08/10/19  7:04 AM   Specimen: BLOOD  Result Value Ref Range Status   Specimen Description BLOOD RIGHT ANTECUBITAL  Final   Special Requests   Final    BOTTLES DRAWN AEROBIC ONLY Blood Culture results may not be optimal due to an inadequate volume of blood received in culture bottles   Culture   Final     NO GROWTH 1 DAY Performed at [facility], [place] [address] [place],  [address]    Report Status PENDING  Incomplete  Culture, blood (routine x 2)     Status: None (Preliminary result)  Collection Time: 08/10/19  7:04 AM   Specimen: BLOOD RIGHT HAND  Result Value Ref Range Status   Specimen Description BLOOD RIGHT HAND  Final   Special Requests   Final    BOTTLES DRAWN AEROBIC ONLY Blood Culture adequate volume   Culture   Final    NO GROWTH 1 DAY Performed at [facility], [address], [place], [place] [address]    Report Status PENDING  Incomplete    Studies/Results: ECHOCARDIOGRAM COMPLETE  Result Date: 08/09/2019    ECHOCARDIOGRAM REPORT   Patient Name:   Monica Eaton Date of Exam: 08/09/2019 Medical Rec #:  [id number]         Height:       67.0 in Accession #:    [id number]        Weight:       134.9 lb Date of Birth:  Jan 04, 1976         BSA:          40.711 m Patient Age:    73 years          BP:           122/74 mmHg Patient Gender: F                 HR:           91 bpm. Exam Location:  Inpatient Procedure: 2D Echo, Cardiac Doppler, Color Doppler and 3D Echo Indications:    Bacteremia  History:        Patient has no prior history of Echocardiogram examinations.                 Signs/Symptoms:Bacteremia; Risk Factors:Current Smoker. IVDU.  Sonographer:    Roseanna Rainbow RDCS Referring Phys: [id number] [facility]  Sonographer Comments: Technically difficult study due to poor echo windows. Patient in fetal position and could not move due to extreme neck pain from neck surgery on previuos day. IMPRESSIONS  1. Left ventricular ejection fraction, by estimation, is 60 to 65%. The left ventricle has normal function. The left ventricle has no regional wall motion abnormalities. Left ventricular diastolic parameters were normal.  2. Right ventricular systolic function is normal. The right ventricular size is normal.  3. The mitral valve is normal in structure. Trivial mitral  valve regurgitation. No evidence of mitral stenosis.  4. The aortic valve is tricuspid. Aortic valve regurgitation is not visualized. No aortic stenosis is present.  5. The inferior vena cava is normal in size with greater than 50% respiratory variability, suggesting right atrial pressure of 3 mmHg. FINDINGS  Left Ventricle: Left ventricular ejection fraction, by estimation, is 60 to 65%. The left ventricle has normal function. The left ventricle has no regional wall motion abnormalities. The left ventricular internal cavity size was normal in size. There is  no left ventricular hypertrophy. Left ventricular diastolic parameters were normal. Right Ventricle: The right ventricular size is normal.Right ventricular systolic function is normal. Left Atrium: Left atrial size was normal in size. Right Atrium: Right atrial size was normal in size. Pericardium: There is no evidence of pericardial effusion. Mitral Valve: The mitral valve is normal in structure. Normal mobility of the mitral valve leaflets. Trivial mitral valve regurgitation. No evidence of mitral valve stenosis. Tricuspid Valve: The tricuspid valve is normal in structure. Tricuspid valve regurgitation is trivial. No evidence of tricuspid stenosis. Aortic Valve: The aortic valve is tricuspid. Aortic valve regurgitation is not visualized. No aortic stenosis is present. Pulmonic Valve:  The pulmonic valve was normal in structure. Pulmonic valve regurgitation is not visualized. No evidence of pulmonic stenosis. Aorta: The aortic root is normal in size and structure. Venous: The inferior vena cava is normal in size with greater than 50% respiratory variability, suggesting right atrial pressure of 3 mmHg. IAS/Shunts: No atrial level shunt detected by color flow Doppler.  LEFT VENTRICLE PLAX 2D LVIDd:         4.60 cm     Diastology LVIDs:         3.10 cm     LV e' lateral:   13.30 cm/s LV PW:         1.30 cm     LV E/e' lateral: 7.2 LV IVS:        1.10 cm     LV e'  medial:    9.90 cm/s LVOT diam:     1.80 cm     LV E/e' medial:  9.6 LV SV:         55 LV SV Index:   32 LVOT Area:     2.54 cm  LV Volumes (MOD) LV vol d, MOD A2C: 40.5 ml LV vol d, MOD A4C: 85.3 ml LV vol s, MOD A2C: 16.1 ml LV vol s, MOD A4C: 26.4 ml LV SV MOD A2C:     24.4 ml LV SV MOD A4C:     85.3 ml LV SV MOD BP:      43.5 ml RIGHT VENTRICLE             IVC RV S prime:     11.10 cm/s  IVC diam: 2.20 cm TAPSE (M-mode): 2.1 cm LEFT ATRIUM             Index       RIGHT ATRIUM           Index LA diam:        3.40 cm 1.99 cm/m  RA Area:     11.40 cm LA Vol (A2C):   37.7 ml 22.04 ml/m RA Volume:   25.20 ml  14.73 ml/m LA Vol (A4C):   29.8 ml 17.42 ml/m LA Biplane Vol: 33.3 ml 19.47 ml/m  AORTIC VALVE LVOT Vmax:   119.00 cm/s LVOT Vmean:  90.200 cm/s LVOT VTI:    0.215 m  AORTA Ao Root diam: 2.90 cm Ao Asc diam:  3.00 cm MITRAL VALVE MV Area (PHT): 3.36 cm    SHUNTS MV Decel Time: 226 msec    Systemic VTI:  0.22 m MV E velocity: 95.50 cm/s  Systemic Diam: 1.80 cm MV A velocity: 61.30 cm/s MV E/A ratio:  1.56 Kirk Ruths MD Electronically signed by Kirk Ruths MD Signature Date/Time: 08/09/2019/3:50:14 PM    Final       Assessment/Plan:  INTERVAL HISTORY: Monica Eaton was able to clarify the patient's history of allergic reaction to penicillin as a child.  It sounds like it was largely a rash but no problems with angioedema or breathing.  They do not recall her having trouble with cephalosporins   Principal Problem:   Cervical pain (neck) Active Problems:   IVDU (intravenous drug user)   Tobacco abuse   Neck pain   Bandemia   MSSA bacteremia    Monica Eaton is a 44 y.o. 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  Neurosurgery is okay with TEE that I suspect given the amount of pain she has she might need general anesthesia for this.  I am paging the cards master to discuss.  She did have some headaches and I was considering MRI of the head but headaches are improving right now and I do not think she will tolerate being in an MRI machine  As mentioned yesterday ideally she needs 6 to 8 weeks of systemic antibiotics.    She will need long-term oral antibiotics given placement of hardware.  2.  IV drug use needs a plan for this long-term.  She is HIV negative hep B and hep C negative  I will check hepatitis B surface antibodies if she does not have them she would that benefit the being vaccinated against hep a and B  #3 IVDU: Had lengthy conversation with her father today.  She has had treatment at multiple treatment centers I think 5 in total that he told me.  We will plan on asking internal medicine faculty to visit with her when appropriate to discuss opiate replacement clinic here at [facility].  Dr. Linus Salmons will take over the service tomorrow.  I spent greater than 35 minutes with the patient including greater than 50% of time in face to face counsel of the patient  And also her father over the phone re the nature of this infection and how we treat it her drug use plans to treat this and in coordination of  her care.     LOS: 3 days   Alcide Evener 08/11/2019, 12:28 PM

## 2019-08-11 NOTE — Progress Notes (Signed)
Subjective: Patient reports neck pain  Objective: Vital signs in last 24 hours: Temp:  [97.7 F (36.5 C)-98.9 F (37.2 C)] 98.6 F (37 C) (07/05 0903) Pulse Rate:  [87-96] 87 (07/05 0903) Resp:  [17-20] 20 (07/05 0903) BP: (129-148)/(81-98) 148/98 (07/05 0903) SpO2:  [100 %] 100 % (07/05 0903)  Intake/Output from previous day: 07/04 0701 - 07/05 0700 In: 150 [P.O.:150] Out: 75 [Urine:75] Intake/Output this shift: No intake/output data recorded.  4/5 strength in hand grip bilaterally, 4+/5 proximally, 4+/5 in LEs Incision c/d  Lab Results: Recent Labs    08/10/19 0704 08/11/19 1000  WBC 13.2* 14.0*  HGB 9.3* 9.5*  HCT 28.8* 28.5*  PLT 265 332   BMET Recent Labs    08/10/19 0704 08/11/19 1000  NA 136 134*  K 3.5 3.3*  CL 104 99  CO2 23 25  GLUCOSE 162* 127*  BUN 7 5*  CREATININE 0.48 0.47  CALCIUM 8.0* 8.3*    Studies/Results: ECHOCARDIOGRAM COMPLETE  Result Date: 08/09/2019    ECHOCARDIOGRAM REPORT   Patient Name:   Monica Eaton Date of Exam: 08/09/2019 Medical Rec #:  [id number]         Height:       67.0 in Accession #:    [id number]        Weight:       134.9 lb Date of Birth:  Aug 16, 1975         BSA:          24.711 m Patient Age:    44 years          BP:           122/74 mmHg Patient Gender: F                 HR:           91 bpm. Exam Location:  Inpatient Procedure: 2D Echo, Cardiac Doppler, Color Doppler and 3D Echo Indications:    Bacteremia  History:        Patient has no prior history of Echocardiogram examinations.                 Signs/Symptoms:Bacteremia; Risk Factors:Current Smoker. IVDU.  Sonographer:    Roseanna Rainbow RDCS Referring Phys: [id number] [facility]  Sonographer Comments: Technically difficult study due to poor echo windows. Patient in fetal position and could not move due to extreme neck pain from neck surgery on previuos day. IMPRESSIONS  1. Left ventricular ejection fraction, by estimation, is 60 to 65%. The left ventricle has normal  function. The left ventricle has no regional wall motion abnormalities. Left ventricular diastolic parameters were normal.  2. Right ventricular systolic function is normal. The right ventricular size is normal.  3. The mitral valve is normal in structure. Trivial mitral valve regurgitation. No evidence of mitral stenosis.  4. The aortic valve is tricuspid. Aortic valve regurgitation is not visualized. No aortic stenosis is present.  5. The inferior vena cava is normal in size with greater than 50% respiratory variability, suggesting right atrial pressure of 3 mmHg. FINDINGS  Left Ventricle: Left ventricular ejection fraction, by estimation, is 60 to 65%. The left ventricle has normal function. The left ventricle has no regional wall motion abnormalities. The left ventricular internal cavity size was normal in size. There is  no left ventricular hypertrophy. Left ventricular diastolic parameters were normal. Right Ventricle: The right ventricular size is normal.Right ventricular systolic function is normal. Left Atrium: Left atrial size was normal  in size. Right Atrium: Right atrial size was normal in size. Pericardium: There is no evidence of pericardial effusion. Mitral Valve: The mitral valve is normal in structure. Normal mobility of the mitral valve leaflets. Trivial mitral valve regurgitation. No evidence of mitral valve stenosis. Tricuspid Valve: The tricuspid valve is normal in structure. Tricuspid valve regurgitation is trivial. No evidence of tricuspid stenosis. Aortic Valve: The aortic valve is tricuspid. Aortic valve regurgitation is not visualized. No aortic stenosis is present. Pulmonic Valve: The pulmonic valve was normal in structure. Pulmonic valve regurgitation is not visualized. No evidence of pulmonic stenosis. Aorta: The aortic root is normal in size and structure. Venous: The inferior vena cava is normal in size with greater than 50% respiratory variability, suggesting right atrial pressure of  3 mmHg. IAS/Shunts: No atrial level shunt detected by color flow Doppler.  LEFT VENTRICLE PLAX 2D LVIDd:         4.60 cm     Diastology LVIDs:         3.10 cm     LV e' lateral:   13.30 cm/s LV PW:         1.30 cm     LV E/e' lateral: 7.2 LV IVS:        1.10 cm     LV e' medial:    9.90 cm/s LVOT diam:     1.80 cm     LV E/e' medial:  9.6 LV SV:         55 LV SV Index:   32 LVOT Area:     2.54 cm  LV Volumes (MOD) LV vol d, MOD A2C: 40.5 ml LV vol d, MOD A4C: 85.3 ml LV vol s, MOD A2C: 16.1 ml LV vol s, MOD A4C: 26.4 ml LV SV MOD A2C:     24.4 ml LV SV MOD A4C:     85.3 ml LV SV MOD BP:      43.5 ml RIGHT VENTRICLE             IVC RV S prime:     11.10 cm/s  IVC diam: 2.20 cm TAPSE (M-mode): 2.1 cm LEFT ATRIUM             Index       RIGHT ATRIUM           Index LA diam:        3.40 cm 1.99 cm/m  RA Area:     11.40 cm LA Vol (A2C):   37.7 ml 22.04 ml/m RA Volume:   25.20 ml  14.73 ml/m LA Vol (A4C):   29.8 ml 17.42 ml/m LA Biplane Vol: 33.3 ml 19.47 ml/m  AORTIC VALVE LVOT Vmax:   119.00 cm/s LVOT Vmean:  90.200 cm/s LVOT VTI:    0.215 m  AORTA Ao Root diam: 2.90 cm Ao Asc diam:  3.00 cm MITRAL VALVE MV Area (PHT): 3.36 cm    SHUNTS MV Decel Time: 226 msec    Systemic VTI:  0.22 m MV E velocity: 95.50 cm/s  Systemic Diam: 1.80 cm MV A velocity: 61.30 cm/s MV E/A ratio:  1.56 Kirk Ruths MD Electronically signed by Kirk Ruths MD Signature Date/Time: 08/09/2019/3:50:14 PM    Final     Assessment/Plan: S/p PCDF for stenosis from EDA - continue antibiotics and supportive care   Vallarie Mare 08/11/2019, 12:04 PM

## 2019-08-11 NOTE — Progress Notes (Signed)
Patient ID: Monica Eaton, female   DOB: 07-23-1975, 44 y.o.   MRN: [id number]  PROGRESS NOTE    Monica Eaton  NIO:[id number] DOB: 05-22-1975 DOA: 08/07/2019 PCP: Patient, No Pcp Per   Brief Narrative:  44 year old female with history of tobacco abuse and IV drug abuse presented with severe neck pain and upper back pain.  In the ED, she had leukocytosis with CRP of 9.4.  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  C2-3 nonsegmentation and 3 mm of anterolisthesis at C3-4 was also noted.  An attempt for MRI cervical spine with contrast was unsuccessful due to patient not being able to lay still despite IV Dilaudid, Ativan and Valium.  She was subsequently transferred to [facility] for MRI under general anesthesia.  She was started on broad-spectrum IV antibiotics.  Neurosurgery was consulted.  After MRI of spine under general anesthesia, patient underwent surgical intervention by neurosurgery on 08/08/2019.  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  Noncontrast cervical spinal MRI as above.  She subsequently underwent MRI with contrast of cervical/thoracic/lumbar spine under general anesthesia.  MRI of cervical spine was abnormal with probable acute cervical spine discitis/osteomyelitis with cervical spinal stenosis.   -Post MRI with contrast, patient had more weakness in the lower extremities. -She subsequently underwent surgical intervention by neurosurgery.  No purulent material seen Intra-Op so no cultures were sent. -Blood cultures/BCID growing MSSA.  ID consultation appreciated.  Currently still on vancomycin due to penicillin/cephalosporin allergy.   Follow ID recommendations.  Follow repeat cultures from 08/10/2019 -2D echo showed EF of 60 to 75% with no obvious valvular vegetations.  Will eventually need TEE -Wound care as per neurosurgery recommendations. -WBCs improving; no labs today. -Pain management.  Bowel regimen.  IV drug abuse Tobacco abuse -Urine drug screen is positive for benzodiazepines, opiates and tetrahydrocannabinol -Education officer, museum consult.   DVT prophylaxis: Lovenox Code Status: Full Family Communication: None at bedside.  Spoke to father on phone on 08/09/2019 Disposition Plan: Status is: Inpatient  Remains inpatient appropriate because:Inpatient level of care appropriate due to severity of illness   Dispo: The patient is from: Home              Anticipated d/c is to: Undermined              Anticipated d/c date is: > 3 days              Patient currently is not medically stable to d/c.  Consultants: Neurosurgery/ID  Procedures:  Posterior C3-C7 laminectomies, C3-C6 instrumented fusion on 08/08/2019  Antimicrobials:  Anti-infectives (From admission, onward)   Start     Dose/Rate Route Frequency Ordered Stop   08/10/19 1400  vancomycin (VANCOREADY) IVPB 750 mg/150 mL     Discontinue     750 mg 150 mL/hr over 60 Minutes Intravenous Every 8 hours 08/10/19 1254     08/08/19 2042  bacitracin 50,000 Units in sodium chloride 0.9 % 500 mL irrigation  Status:  Discontinued          As needed 08/08/19 2043 08/08/19 2350   08/08/19 1800  vancomycin (VANCOCIN) IVPB 1000 mg/200 mL premix  Status:  Discontinued        1,000 mg 200 mL/hr over 60 Minutes Intravenous  Every 12 hours 08/08/19 0644 08/10/19 0919   08/08/19 0800  levofloxacin (LEVAQUIN) IVPB 750 mg  Status:  Discontinued        750 mg 100 mL/hr over 90 Minutes Intravenous Every 24 hours 08/08/19 0644 08/09/19 0115   08/08/19 0645  vancomycin (VANCOREADY) IVPB 1250 mg/250 mL        1,250 mg 166.7 mL/hr over 90 Minutes Intravenous  Once 08/08/19 3335  08/08/19 0916       Subjective: Patient seen and examined at bedside.  Poor historian.  Still complains of neck and shoulder pain.  No overnight fever, vomiting or diarrhea reported.    Objective: Vitals:   08/10/19 1117 08/10/19 1607 08/10/19 1931 08/10/19 2348  BP: 121/75 129/81 (!) 141/84 129/81  Pulse: 83 90 91 96  Resp: 16 18 17 18   Temp: 98.1 F (36.7 C) 98.5 F (36.9 C) 98.9 F (37.2 C) 97.7 F (36.5 C)  TempSrc: Oral Oral Oral   SpO2: 100% 100% 100% 100%  Weight:      Height:        Intake/Output Summary (Last 24 hours) at 08/11/2019 0754 Last data filed at 08/10/2019 1754 Gross per 24 hour  Intake 150 ml  Output 75 ml  Net 75 ml   Filed Weights   08/07/19 2205 08/08/19 1253 08/08/19 1823  Weight: 59 kg 59 kg 61.2 kg    Examination:  General exam: No acute distress.  Poor historian.  Looks older than stated age.  Looks chronically ill.    Respiratory system: Bilateral decreased breath sounds at bases with some scattered crackles Cardiovascular system: S1-S2 heard, rate controlled Gastrointestinal system: Abdomen is nondistended, soft and nontender.  Normal bowel sounds heard  extremities: No cyanosis, clubbing or edema Central nervous system: Awake, poor historian.  Moves extremities Lymph: No cervical lymphadenopathy  psychiatry: Looks anxious    Data Reviewed: I have personally reviewed following labs and imaging studies  CBC: Recent Labs  Lab 08/08/19 0007 08/08/19 0727 08/09/19 0336 08/10/19 0704  WBC 19.4* 18.9* 18.6* 13.2*  NEUTROABS 16.4*  --   --  10.6*  HGB 12.0 11.8* 10.6* 9.3*  HCT 35.8* 36.3 32.6* 28.8*  MCV 94.5 95.0 96.2 97.6  PLT 347 344 296 456   Basic Metabolic Panel: Recent Labs  Lab 08/08/19 0007 08/08/19 0727 08/09/19 0336 08/10/19 0704  NA 136  --  136 136  K 3.5  --  3.8 3.5  CL 99  --  100 104  CO2 28  --  26 23  GLUCOSE 143*  --  141* 162*  BUN 11  --  6 7  CREATININE 0.49 0.55 0.54 0.48  CALCIUM 9.0  --  8.8*  8.0*  MG  --   --  1.8 2.0  PHOS  --   --  3.6  --    GFR: Estimated Creatinine Clearance: 88.5 mL/min (by C-G formula based on SCr of 0.48 mg/dL). Liver Function Tests: Recent Labs  Lab 08/08/19 0007 08/09/19 0336  AST 30 18  ALT 22 16  ALKPHOS 89 62  BILITOT 0.8 0.8  PROT 8.2* 6.2*  ALBUMIN 4.5 2.8*   No results for input(s): LIPASE, AMYLASE in the last 168 hours. No results for input(s): AMMONIA in the last 168 hours. Coagulation Profile: Recent Labs  Lab 08/09/19 0336  INR 1.2   Cardiac Enzymes: No results for input(s): CKTOTAL, CKMB, CKMBINDEX, TROPONINI in the last 168 hours. BNP (last 3 results) No results for input(s): PROBNP  in the last 8760 hours. HbA1C: No results for input(s): HGBA1C in the last 72 hours. CBG: No results for input(s): GLUCAP in the last 168 hours. Lipid Profile: No results for input(s): CHOL, HDL, LDLCALC, TRIG, CHOLHDL, LDLDIRECT in the last 72 hours. Thyroid Function Tests: No results for input(s): TSH, T4TOTAL, FREET4, T3FREE, THYROIDAB in the last 72 hours. Anemia Panel: No results for input(s): VITAMINB12, FOLATE, FERRITIN, TIBC, IRON, RETICCTPCT in the last 72 hours. Sepsis Labs: No results for input(s): PROCALCITON, LATICACIDVEN in the last 168 hours.  Recent Results (from the past 240 hour(s))  Culture, blood (Routine X 2) w Reflex to ID Panel     Status: Abnormal   Collection Time: 08/08/19  7:27 AM   Specimen: BLOOD RIGHT FOREARM  Result Value Ref Range Status   Specimen Description BLOOD RIGHT FOREARM  Final   Special Requests   Final    BOTTLES DRAWN AEROBIC AND ANAEROBIC Blood Culture results may not be optimal due to an inadequate volume of blood received in culture bottles   Culture  Setup Time   Final    IN BOTH AEROBIC AND ANAEROBIC BOTTLES GRAM POSITIVE COCCI IN CLUSTERS CRITICAL RESULT CALLED TO, READ BACK BY AND VERIFIED WITH: [facility] 08/09/19 0102 JDW Performed at [facility], [address],  [place], [place] [address]    Culture STAPHYLOCOCCUS AUREUS (A)  Final   Report Status 08/10/2019 FINAL  Final   Organism ID, Bacteria STAPHYLOCOCCUS AUREUS  Final      Susceptibility   Staphylococcus aureus - MIC*    CIPROFLOXACIN <=0.5 SENSITIVE Sensitive     ERYTHROMYCIN >=8 RESISTANT Resistant     GENTAMICIN <=0.5 SENSITIVE Sensitive     OXACILLIN 0.5 SENSITIVE Sensitive     TETRACYCLINE <=1 SENSITIVE Sensitive     VANCOMYCIN <=0.5 SENSITIVE Sensitive     TRIMETH/SULFA <=10 SENSITIVE Sensitive     CLINDAMYCIN <=0.25 SENSITIVE Sensitive     RIFAMPIN <=0.5 SENSITIVE Sensitive     Inducible Clindamycin NEGATIVE Sensitive     * STAPHYLOCOCCUS AUREUS  Blood Culture ID Panel (Reflexed)     Status: Abnormal   Collection Time: 08/08/19  7:27 AM  Result Value Ref Range Status   Enterococcus species NOT DETECTED NOT DETECTED Final   Listeria monocytogenes NOT DETECTED NOT DETECTED Final   Staphylococcus species DETECTED (A) NOT DETECTED Final    Comment: CRITICAL RESULT CALLED TO, READ BACK BY AND VERIFIED WITH: J LEDFORD PHARMD 08/09/19 0102 JDW    Staphylococcus aureus (BCID) DETECTED (A) NOT DETECTED Final    Comment: Methicillin (oxacillin) susceptible Staphylococcus aureus (MSSA). Preferred therapy is anti staphylococcal beta lactam antibiotic (Cefazolin or Nafcillin), unless clinically contraindicated. CRITICAL RESULT CALLED TO, READ BACK BY AND VERIFIED WITH: J LEDFORD [facility] 08/09/19 0102 JDW    Methicillin resistance NOT DETECTED NOT DETECTED Final   Streptococcus species NOT DETECTED NOT DETECTED Final   Streptococcus agalactiae NOT DETECTED NOT DETECTED Final   Streptococcus pneumoniae NOT DETECTED NOT DETECTED Final   Streptococcus pyogenes NOT DETECTED NOT DETECTED Final   Acinetobacter baumannii NOT DETECTED NOT DETECTED Final   Enterobacteriaceae species NOT DETECTED NOT DETECTED Final   Enterobacter cloacae complex NOT DETECTED NOT DETECTED Final   Escherichia coli NOT  DETECTED NOT DETECTED Final   Klebsiella oxytoca NOT DETECTED NOT DETECTED Final   Klebsiella pneumoniae NOT DETECTED NOT DETECTED Final   Proteus species NOT DETECTED NOT DETECTED Final   Serratia marcescens NOT DETECTED NOT DETECTED Final  Haemophilus influenzae NOT DETECTED NOT DETECTED Final   Neisseria meningitidis NOT DETECTED NOT DETECTED Final   Pseudomonas aeruginosa NOT DETECTED NOT DETECTED Final   Candida albicans NOT DETECTED NOT DETECTED Final   Candida glabrata NOT DETECTED NOT DETECTED Final   Candida krusei NOT DETECTED NOT DETECTED Final   Candida parapsilosis NOT DETECTED NOT DETECTED Final   Candida tropicalis NOT DETECTED NOT DETECTED Final    Comment: Performed at [facility], [place] [address], [place], [place] [address]  SARS Coronavirus 2 by RT PCR (hospital order, performed in [facility] hospital lab) Nasopharyngeal Nasopharyngeal Swab     Status: None   Collection Time: 08/08/19  9:02 AM   Specimen: Nasopharyngeal Swab  Result Value Ref Range Status   SARS Coronavirus 2 NEGATIVE NEGATIVE Final    Comment: (NOTE) SARS-CoV-2 target nucleic acids are NOT DETECTED.  The SARS-CoV-2 RNA is generally detectable in upper and lower respiratory specimens during the acute phase of infection. The lowest concentration of SARS-CoV-2 viral copies this assay can detect is 250 copies / mL. A negative result does not preclude SARS-CoV-2 infection and should not be used as the sole basis for treatment or other patient management decisions.  A negative result may occur with improper specimen collection / handling, submission of specimen other than nasopharyngeal swab, presence of viral mutation(s) within the areas targeted by this assay, and inadequate number of viral copies (<250 copies / mL). A negative result must be combined with clinical observations, patient history, and epidemiological information.  Fact Sheet for Patients:     StrictlyIdeas.no  Fact Sheet for Healthcare Providers: BankingDealers.co.za  This test is not yet approved or  cleared by the [place] FDA and has been authorized for detection and/or diagnosis of SARS-CoV-2 by FDA under an Emergency Use Authorization (EUA).  This EUA will remain in effect (meaning this test can be used) for the duration of the COVID-19 declaration under Section 564(b)(1) of the Act, 21 U.S.C. section 360bbb-3(b)(1), unless the authorization is terminated or revoked sooner.  Performed at [facility], [place] [address] [place], [place] [address]   Culture, blood (Routine X 2) w Reflex to ID Panel     Status: Abnormal (Preliminary result)   Collection Time: 08/08/19  5:11 PM   Specimen: BLOOD RIGHT HAND  Result Value Ref Range Status   Specimen Description BLOOD RIGHT HAND  Final   Special Requests   Final    BOTTLES DRAWN AEROBIC AND ANAEROBIC Blood Culture adequate volume   Culture  Setup Time   Final    GRAM POSITIVE COCCI IN CLUSTERS IN BOTH AEROBIC AND ANAEROBIC BOTTLES CRITICAL RESULT CALLED TO, READ BACK BY AND VERIFIED WITH: J. LEDFORD,PHARMD 0503 08/09/2019 Mena Goes Performed at [facility], [place] [address], [place], [place] [address]    Culture STAPHYLOCOCCUS AUREUS (A)  Final   Report Status PENDING  Incomplete         Radiology Studies: ECHOCARDIOGRAM COMPLETE  Result Date: 08/09/2019    ECHOCARDIOGRAM REPORT   Patient Name:   Monica Eaton Date of Exam: 08/09/2019 Medical Rec #:  [id number]         Height:       67.0 in Accession #:    [id number]        Weight:       134.9 lb Date of Birth:  05-15-1975         BSA:  1.711 m Patient Age:    44 years          BP:           122/74 mmHg Patient Gender: F                 HR:           91 bpm. Exam Location:  Inpatient Procedure: 2D Echo, Cardiac Doppler, Color Doppler and 3D Echo Indications:    Bacteremia  History:        Patient  has no prior history of Echocardiogram examinations.                 Signs/Symptoms:Bacteremia; Risk Factors:Current Smoker. IVDU.  Sonographer:    Roseanna Rainbow RDCS Referring Phys: [id number] [facility]  Sonographer Comments: Technically difficult study due to poor echo windows. Patient in fetal position and could not move due to extreme neck pain from neck surgery on previuos day. IMPRESSIONS  1. Left ventricular ejection fraction, by estimation, is 60 to 65%. The left ventricle has normal function. The left ventricle has no regional wall motion abnormalities. Left ventricular diastolic parameters were normal.  2. Right ventricular systolic function is normal. The right ventricular size is normal.  3. The mitral valve is normal in structure. Trivial mitral valve regurgitation. No evidence of mitral stenosis.  4. The aortic valve is tricuspid. Aortic valve regurgitation is not visualized. No aortic stenosis is present.  5. The inferior vena cava is normal in size with greater than 50% respiratory variability, suggesting right atrial pressure of 3 mmHg. FINDINGS  Left Ventricle: Left ventricular ejection fraction, by estimation, is 60 to 65%. The left ventricle has normal function. The left ventricle has no regional wall motion abnormalities. The left ventricular internal cavity size was normal in size. There is  no left ventricular hypertrophy. Left ventricular diastolic parameters were normal. Right Ventricle: The right ventricular size is normal.Right ventricular systolic function is normal. Left Atrium: Left atrial size was normal in size. Right Atrium: Right atrial size was normal in size. Pericardium: There is no evidence of pericardial effusion. Mitral Valve: The mitral valve is normal in structure. Normal mobility of the mitral valve leaflets. Trivial mitral valve regurgitation. No evidence of mitral valve stenosis. Tricuspid Valve: The tricuspid valve is normal in structure. Tricuspid valve regurgitation is  trivial. No evidence of tricuspid stenosis. Aortic Valve: The aortic valve is tricuspid. Aortic valve regurgitation is not visualized. No aortic stenosis is present. Pulmonic Valve: The pulmonic valve was normal in structure. Pulmonic valve regurgitation is not visualized. No evidence of pulmonic stenosis. Aorta: The aortic root is normal in size and structure. Venous: The inferior vena cava is normal in size with greater than 50% respiratory variability, suggesting right atrial pressure of 3 mmHg. IAS/Shunts: No atrial level shunt detected by color flow Doppler.  LEFT VENTRICLE PLAX 2D LVIDd:         4.60 cm     Diastology LVIDs:         3.10 cm     LV e' lateral:   13.30 cm/s LV PW:         1.30 cm     LV E/e' lateral: 7.2 LV IVS:        1.10 cm     LV e' medial:    9.90 cm/s LVOT diam:     1.80 cm     LV E/e' medial:  9.6 LV SV:  55 LV SV Index:   32 LVOT Area:     2.54 cm  LV Volumes (MOD) LV vol d, MOD A2C: 40.5 ml LV vol d, MOD A4C: 85.3 ml LV vol s, MOD A2C: 16.1 ml LV vol s, MOD A4C: 26.4 ml LV SV MOD A2C:     24.4 ml LV SV MOD A4C:     85.3 ml LV SV MOD BP:      43.5 ml RIGHT VENTRICLE             IVC RV S prime:     11.10 cm/s  IVC diam: 2.20 cm TAPSE (M-mode): 2.1 cm LEFT ATRIUM             Index       RIGHT ATRIUM           Index LA diam:        3.40 cm 1.99 cm/m  RA Area:     11.40 cm LA Vol (A2C):   37.7 ml 22.04 ml/m RA Volume:   25.20 ml  14.73 ml/m LA Vol (A4C):   29.8 ml 17.42 ml/m LA Biplane Vol: 33.3 ml 19.47 ml/m  AORTIC VALVE LVOT Vmax:   119.00 cm/s LVOT Vmean:  90.200 cm/s LVOT VTI:    0.215 m  AORTA Ao Root diam: 2.90 cm Ao Asc diam:  3.00 cm MITRAL VALVE MV Area (PHT): 3.36 cm    SHUNTS MV Decel Time: 226 msec    Systemic VTI:  0.22 m MV E velocity: 95.50 cm/s  Systemic Diam: 1.80 cm MV A velocity: 61.30 cm/s MV E/A ratio:  1.56 Kirk Ruths MD Electronically signed by Kirk Ruths MD Signature Date/Time: 08/09/2019/3:50:14 PM    Final         Scheduled Meds: .  Chlorhexidine Gluconate Cloth  6 each Topical Daily  . cloNIDine  0.1 mg Oral QID   Followed by  . cloNIDine  0.1 mg Oral BH-qamhs   Followed by  . [START ON 08/13/2019] cloNIDine  0.1 mg Oral QAC breakfast  . enoxaparin (LOVENOX) injection  40 mg Subcutaneous Q24H  . folic acid  1 mg Oral Daily  . multivitamin with minerals  1 tablet Oral Daily  . nicotine  14 mg Transdermal Daily  . thiamine  100 mg Oral Daily   Or  . thiamine  100 mg Intravenous Daily   Continuous Infusions: . sodium chloride 1,000 mL (08/10/19 1835)  . vancomycin 750 mg (08/11/19 4132)          Aline August, MD [facility] 08/11/2019, 7:54 AM

## 2019-08-12 ENCOUNTER — Encounter (HOSPITAL_COMMUNITY): Payer: Self-pay | Admitting: Radiology

## 2019-08-12 DIAGNOSIS — R531 Weakness: Secondary | ICD-10-CM

## 2019-08-12 DIAGNOSIS — G8918 Other acute postprocedural pain: Secondary | ICD-10-CM

## 2019-08-12 LAB — CBC WITH DIFFERENTIAL/PLATELET
Abs Immature Granulocytes: 0.1 10*3/uL — ABNORMAL HIGH (ref 0.00–0.07)
Basophils Absolute: 0 10*3/uL (ref 0.0–0.1)
Basophils Relative: 0 %
Eosinophils Absolute: 0 10*3/uL (ref 0.0–0.5)
Eosinophils Relative: 0 %
HCT: 30.2 % — ABNORMAL LOW (ref 36.0–46.0)
Hemoglobin: 10.2 g/dL — ABNORMAL LOW (ref 12.0–15.0)
Immature Granulocytes: 1 %
Lymphocytes Relative: 10 %
Lymphs Abs: 1.5 10*3/uL (ref 0.7–4.0)
MCH: 30.8 pg (ref 26.0–34.0)
MCHC: 33.8 g/dL (ref 30.0–36.0)
MCV: 91.2 fL (ref 80.0–100.0)
Monocytes Absolute: 1.6 10*3/uL — ABNORMAL HIGH (ref 0.1–1.0)
Monocytes Relative: 11 %
Neutro Abs: 11.7 10*3/uL — ABNORMAL HIGH (ref 1.7–7.7)
Neutrophils Relative %: 78 %
Platelets: 421 10*3/uL — ABNORMAL HIGH (ref 150–400)
RBC: 3.31 MIL/uL — ABNORMAL LOW (ref 3.87–5.11)
RDW: 12.3 % (ref 11.5–15.5)
WBC: 15 10*3/uL — ABNORMAL HIGH (ref 4.0–10.5)
nRBC: 0 % (ref 0.0–0.2)

## 2019-08-12 LAB — COMPREHENSIVE METABOLIC PANEL
ALT: 16 U/L (ref 0–44)
AST: 15 U/L (ref 15–41)
Albumin: 2.2 g/dL — ABNORMAL LOW (ref 3.5–5.0)
Alkaline Phosphatase: 82 U/L (ref 38–126)
Anion gap: 12 (ref 5–15)
BUN: 6 mg/dL (ref 6–20)
CO2: 24 mmol/L (ref 22–32)
Calcium: 8.3 mg/dL — ABNORMAL LOW (ref 8.9–10.3)
Chloride: 99 mmol/L (ref 98–111)
Creatinine, Ser: 0.38 mg/dL — ABNORMAL LOW (ref 0.44–1.00)
GFR calc Af Amer: >60 mL/min (ref >60)
GFR calc non Af Amer: >60 mL/min (ref >60)
Glucose, Bld: 115 mg/dL — ABNORMAL HIGH (ref 70–99)
Potassium: 3.4 mmol/L — ABNORMAL LOW (ref 3.5–5.1)
Sodium: 135 mmol/L (ref 135–145)
Total Bilirubin: 0.7 mg/dL (ref 0.3–1.2)
Total Protein: 5.6 g/dL — ABNORMAL LOW (ref 6.5–8.1)

## 2019-08-12 LAB — C-REACTIVE PROTEIN: CRP: 16.6 mg/dL — ABNORMAL HIGH (ref <1.0)

## 2019-08-12 LAB — MAGNESIUM: Magnesium: 1.8 mg/dL (ref 1.7–2.4)

## 2019-08-12 LAB — HEPATITIS B SURFACE ANTIBODY, QUANTITATIVE: Hep B S AB Quant (Post): <3.1 m[IU]/mL — ABNORMAL LOW (ref >9.9)

## 2019-08-12 MED ORDER — RIFAMPIN 300 MG PO CAPS
300.0000 mg | ORAL_CAPSULE | Freq: Two times a day (BID) | ORAL | Status: DC
  Administered 2019-08-12 – 2019-08-28 (×32): 300 mg via ORAL
  Filled 2019-08-12 (×33): qty 1

## 2019-08-12 MED FILL — Thrombin For Soln 5000 Unit: CUTANEOUS | Qty: 5000 | Status: AC

## 2019-08-12 NOTE — Progress Notes (Signed)
[place] [facility]  Date of Admission:  08/07/2019     Total days of antibiotics          ASSESSMENT:  Ms. Enyeart blood cultures from 7/4 have remained without growth to date. Continues to have expected post surgical pain. Neurologically has had some improvement since surgery but is concerned about ability to walk. Pain control remains a concern as she feels the dilaudid is now starting to help some but has challenges getting it. Discussed possibility of TEE to check for endocarditis and continued need for antibiotics. Will add rifampin given new hardware in place. Tolerating cefazolin with no adverse side effects at present. Continue current dose of Cefazolin.   PLAN:  1. Continue current dose of Cefazolin. 2. Add rifampin. 3. Pain management per primary team. 4. Monitor cultures for continued clearance of bacteremia.  5. Will discuss possibility of TEE.    Principal Problem:   Cervical pain (neck) Active Problems:   IVDU (intravenous drug user)   Tobacco abuse   Neck pain   Bandemia   MSSA bacteremia   Epidural abscess   Discitis of cervical region   Hardware complicating wound infection ([place])   . Chlorhexidine Gluconate Cloth  6 each Topical Daily  . cloNIDine  0.1 mg Oral BH-qamhs   Followed by  . [START ON 08/13/2019] cloNIDine  0.1 mg Oral QAC breakfast  . enoxaparin (LOVENOX) injection  40 mg Subcutaneous Q24H  . folic acid  1 mg Oral Daily  . multivitamin with minerals  1 tablet Oral Daily  . nicotine  14 mg Transdermal Daily  . senna-docusate  1 tablet Oral BID  . thiamine  100 mg Oral Daily    SUBJECTIVE:  Afebrile overnight with no acute events. Continues to have neck pain that is slowly improving. Sacred that she will not be able to walk and wants to try. Has not received her meal tray. Has concerns about receiving her pain medication and is bored.   Allergies  Allergen Reactions  . Cefaclor   . Cephalosporins   . Nitrofurantoin    . Penicillins     Spoke with patient's family. They report that Ms. Chojnowski had a rash/hives when she was 35-42 years old. She did not have any shortness of breath and they do not recall having to take her to the doctor's office or hospital to treat the reaction.      Review of Systems: Review of Systems  Constitutional: Negative for chills, fever and weight loss.  Respiratory: Negative for cough, shortness of breath and wheezing.   Cardiovascular: Negative for chest pain and leg swelling.  Gastrointestinal: Negative for abdominal pain, constipation, diarrhea, nausea and vomiting.  Musculoskeletal: Positive for neck pain.  Skin: Negative for rash.  Neurological: Positive for weakness.      OBJECTIVE: Vitals:   08/12/19 0003 08/12/19 0356 08/12/19 0756 08/12/19 1202  BP: (!) 147/81 (!) 145/92 (!) 151/90 (!) 155/96  Pulse: 90 84 85 80  Resp: 17 17 18 18   Temp: 98.6 F (37 C) 98 F (36.7 C) 98.1 F (36.7 C) 97.8 F (36.6 C)  TempSrc: Oral Oral Oral Oral  SpO2: 100% 99% 100% 100%  Weight:      Height:       Body mass index is 21.13 kg/m.  Physical Exam Constitutional:      General: She is not in acute distress.    Appearance: She is well-developed.  Cardiovascular:     Rate and  Rhythm: Normal rate and regular rhythm.     Heart sounds: Normal heart sounds.  Pulmonary:     Effort: Pulmonary effort is normal.     Breath sounds: Normal breath sounds.  Skin:    General: Skin is warm and dry.  Neurological:     Mental Status: She is alert and oriented to person, place, and time.  Psychiatric:        Behavior: Behavior normal.        Thought Content: Thought content normal.        Judgment: Judgment normal.     Lab Results Lab Results  Component Value Date   WBC 15.0 (H) 08/12/2019   HGB 10.2 (L) 08/12/2019   HCT 30.2 (L) 08/12/2019   MCV 91.2 08/12/2019   PLT 421 (H) 08/12/2019    Lab Results  Component Value Date   CREATININE 0.38 (L) 08/12/2019   BUN 6  08/12/2019   NA 135 08/12/2019   K 3.4 (L) 08/12/2019   CL 99 08/12/2019   CO2 24 08/12/2019    Lab Results  Component Value Date   ALT 16 08/12/2019   AST 15 08/12/2019   ALKPHOS 82 08/12/2019   BILITOT 0.7 08/12/2019     Microbiology: Recent Results (from the past 240 hour(s))  Culture, blood (Routine X 2) w Reflex to ID Panel     Status: Abnormal   Collection Time: 08/08/19  7:27 AM   Specimen: BLOOD RIGHT FOREARM  Result Value Ref Range Status   Specimen Description BLOOD RIGHT FOREARM  Final   Special Requests   Final    BOTTLES DRAWN AEROBIC AND ANAEROBIC Blood Culture results may not be optimal due to an inadequate volume of blood received in culture bottles   Culture  Setup Time   Final    IN BOTH AEROBIC AND ANAEROBIC BOTTLES GRAM POSITIVE COCCI IN CLUSTERS CRITICAL RESULT CALLED TO, READ BACK BY AND VERIFIED WITH: Karsten Ro [facility] 08/09/19 0102 JDW Performed at [facility], [address], [place], [place] [address]    Culture STAPHYLOCOCCUS AUREUS (A)  Final   Report Status 08/10/2019 FINAL  Final   Organism ID, Bacteria STAPHYLOCOCCUS AUREUS  Final      Susceptibility   Staphylococcus aureus - MIC*    CIPROFLOXACIN <=0.5 SENSITIVE Sensitive     ERYTHROMYCIN >=8 RESISTANT Resistant     GENTAMICIN <=0.5 SENSITIVE Sensitive     OXACILLIN 0.5 SENSITIVE Sensitive     TETRACYCLINE <=1 SENSITIVE Sensitive     VANCOMYCIN <=0.5 SENSITIVE Sensitive     TRIMETH/SULFA <=10 SENSITIVE Sensitive     CLINDAMYCIN <=0.25 SENSITIVE Sensitive     RIFAMPIN <=0.5 SENSITIVE Sensitive     Inducible Clindamycin NEGATIVE Sensitive     * STAPHYLOCOCCUS AUREUS  Blood Culture ID Panel (Reflexed)     Status: Abnormal   Collection Time: 08/08/19  7:27 AM  Result Value Ref Range Status   Enterococcus species NOT DETECTED NOT DETECTED Final   Listeria monocytogenes NOT DETECTED NOT DETECTED Final   Staphylococcus species DETECTED (A) NOT DETECTED Final    Comment: CRITICAL RESULT  CALLED TO, READ BACK BY AND VERIFIED WITH: J LEDFORD PHARMD 08/09/19 0102 JDW    Staphylococcus aureus (BCID) DETECTED (A) NOT DETECTED Final    Comment: Methicillin (oxacillin) susceptible Staphylococcus aureus (MSSA). Preferred therapy is anti staphylococcal beta lactam antibiotic (Cefazolin or Nafcillin), unless clinically contraindicated. CRITICAL RESULT CALLED TO, READ BACK BY AND VERIFIED WITH: J LEDFORD [facility] 08/09/19  0102 JDW    Methicillin resistance NOT DETECTED NOT DETECTED Final   Streptococcus species NOT DETECTED NOT DETECTED Final   Streptococcus agalactiae NOT DETECTED NOT DETECTED Final   Streptococcus pneumoniae NOT DETECTED NOT DETECTED Final   Streptococcus pyogenes NOT DETECTED NOT DETECTED Final   Acinetobacter baumannii NOT DETECTED NOT DETECTED Final   Enterobacteriaceae species NOT DETECTED NOT DETECTED Final   Enterobacter cloacae complex NOT DETECTED NOT DETECTED Final   Escherichia coli NOT DETECTED NOT DETECTED Final   Klebsiella oxytoca NOT DETECTED NOT DETECTED Final   Klebsiella pneumoniae NOT DETECTED NOT DETECTED Final   Proteus species NOT DETECTED NOT DETECTED Final   Serratia marcescens NOT DETECTED NOT DETECTED Final   Haemophilus influenzae NOT DETECTED NOT DETECTED Final   Neisseria meningitidis NOT DETECTED NOT DETECTED Final   Pseudomonas aeruginosa NOT DETECTED NOT DETECTED Final   Candida albicans NOT DETECTED NOT DETECTED Final   Candida glabrata NOT DETECTED NOT DETECTED Final   Candida krusei NOT DETECTED NOT DETECTED Final   Candida parapsilosis NOT DETECTED NOT DETECTED Final   Candida tropicalis NOT DETECTED NOT DETECTED Final    Comment: Performed at [facility], [place] [address], [place], [place] [address]  SARS Coronavirus 2 by RT PCR (hospital order, performed in [facility] hospital lab) Nasopharyngeal Nasopharyngeal Swab     Status: None   Collection Time: 08/08/19  9:02 AM   Specimen: Nasopharyngeal Swab  Result Value Ref  Range Status   SARS Coronavirus 2 NEGATIVE NEGATIVE Final    Comment: (NOTE) SARS-CoV-2 target nucleic acids are NOT DETECTED.  The SARS-CoV-2 RNA is generally detectable in upper and lower respiratory specimens during the acute phase of infection. The lowest concentration of SARS-CoV-2 viral copies this assay can detect is 250 copies / mL. A negative result does not preclude SARS-CoV-2 infection and should not be used as the sole basis for treatment or other patient management decisions.  A negative result may occur with improper specimen collection / handling, submission of specimen other than nasopharyngeal swab, presence of viral mutation(s) within the areas targeted by this assay, and inadequate number of viral copies (<250 copies / mL). A negative result must be combined with clinical observations, patient history, and epidemiological information.  Fact Sheet for Patients:   StrictlyIdeas.no  Fact Sheet for Healthcare Providers: BankingDealers.co.za  This test is not yet approved or  cleared by the [place] FDA and has been authorized for detection and/or diagnosis of SARS-CoV-2 by FDA under an Emergency Use Authorization (EUA).  This EUA will remain in effect (meaning this test can be used) for the duration of the COVID-19 declaration under Section 564(b)(1) of the Act, 21 U.S.C. section 360bbb-3(b)(1), unless the authorization is terminated or revoked sooner.  Performed at [facility], [place] [address], [place], [place] [address]   Culture, blood (Routine X 2) w Reflex to ID Panel     Status: Abnormal   Collection Time: 08/08/19  5:11 PM   Specimen: BLOOD RIGHT HAND  Result Value Ref Range Status   Specimen Description BLOOD RIGHT HAND  Final   Special Requests   Final    BOTTLES DRAWN AEROBIC AND ANAEROBIC Blood Culture adequate volume   Culture  Setup Time   Final    GRAM POSITIVE COCCI IN CLUSTERS IN BOTH  AEROBIC AND ANAEROBIC BOTTLES CRITICAL RESULT CALLED TO, READ BACK BY AND VERIFIED WITH: J. LEDFORD,PHARMD 0503 08/09/2019 T. TYSOR    Culture (A)  Final  STAPHYLOCOCCUS AUREUS SUSCEPTIBILITIES PERFORMED ON PREVIOUS CULTURE WITHIN THE LAST 5 DAYS. Performed at [facility], [place] [address], [place], [place] [address]    Report Status 08/11/2019 FINAL  Final  Culture, blood (routine x 2)     Status: None (Preliminary result)   Collection Time: 08/10/19  7:04 AM   Specimen: BLOOD  Result Value Ref Range Status   Specimen Description BLOOD RIGHT ANTECUBITAL  Final   Special Requests   Final    BOTTLES DRAWN AEROBIC ONLY Blood Culture results may not be optimal due to an inadequate volume of blood received in culture bottles   Culture   Final    NO GROWTH 2 DAYS Performed at [facility], [place] [address] [place], [place] [address]    Report Status PENDING  Incomplete  Culture, blood (routine x 2)     Status: None (Preliminary result)   Collection Time: 08/10/19  7:04 AM   Specimen: BLOOD RIGHT HAND  Result Value Ref Range Status   Specimen Description BLOOD RIGHT HAND  Final   Special Requests   Final    BOTTLES DRAWN AEROBIC ONLY Blood Culture adequate volume   Culture   Final    NO GROWTH 2 DAYS Performed at [facility], [place] [address] [place], [place] [address]    Report Status PENDING  Incomplete     Terri Piedra, [place] [facility]  08/12/2019  1:48 PM

## 2019-08-12 NOTE — Progress Notes (Signed)
Patient ID: Monica Eaton, female   DOB: 1975/03/09, 44 y.o.   MRN: [id number]  PROGRESS NOTE    Monica Eaton  LEX:[id number] DOB: Jun 23, 1975 DOA: 08/07/2019 PCP: Patient, No Pcp Per   Brief Narrative:  44 year old female with history of tobacco abuse and IV drug abuse presented with severe neck pain and upper back pain.  In the ED, she had leukocytosis with CRP of 9.4.  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  C2-3 nonsegmentation and 3 mm of anterolisthesis at C3-4 was also noted.  An attempt for MRI cervical spine with contrast was unsuccessful due to patient not being able to lay still despite IV Dilaudid, Ativan and Valium.  She was subsequently transferred to [facility] for MRI under general anesthesia.  She was started on broad-spectrum IV antibiotics.  Neurosurgery was consulted.  After MRI of spine under general anesthesia, patient underwent surgical intervention by neurosurgery on 08/08/2019. She was subsequently found to have MSSA bacteremia and ID was consulted.  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  Noncontrast cervical spinal MRI as above.  She subsequently underwent MRI with contrast of cervical/thoracic/lumbar spine under general anesthesia.  MRI of cervical spine was abnormal with probable acute cervical spine discitis/osteomyelitis with cervical spinal stenosis.   -Post MRI with contrast, patient had more weakness in the lower extremities. -She subsequently underwent surgical intervention by neurosurgery.  No purulent material seen Intra-Op so no cultures were sent. -Blood cultures/BCID growing MSSA.  ID following.  Antibiotics  switched to IV Ancef on 08/11/2019.  Follow ID recommendations.  Repeat cultures from 08/10/2019 have been negative so far. -2D echo showed EF of 60 to 75% with no obvious valvular vegetations.  Will eventually need TEE -Wound care as per neurosurgery recommendations. -WBCs still elevated.  Monitor.  Currently afebrile -Pain management.  Bowel regimen.  IV drug abuse Tobacco abuse -Urine drug screen is positive for benzodiazepines, opiates and tetrahydrocannabinol -Education officer, museum consult.   DVT prophylaxis: Lovenox Code Status: Full Family Communication: None at bedside.  Spoke to father on phone on 08/09/2019 Disposition Plan: Status is: Inpatient  Remains inpatient appropriate because:Inpatient level of care appropriate due to severity of illness.  Might need inpatient 6 weeks of IV antibiotics   Dispo: The patient is from: Home              Anticipated d/c is to: Undermined              Anticipated d/c date is: > 3 days              Patient currently is not medically stable to d/c.  Consultants: Neurosurgery/ID  Procedures:  Posterior C3-C7 laminectomies, C3-C6 instrumented fusion on 08/08/2019  Antimicrobials:  Anti-infectives (From admission, onward)   Start     Dose/Rate Route Frequency Ordered Stop   08/11/19 1400  ceFAZolin (ANCEF) IVPB 2g/100 mL premix     Discontinue     2 g 200 mL/hr over 30 Minutes Intravenous Every 8 hours 08/11/19 1159     08/10/19 1400  vancomycin (VANCOREADY) IVPB 750 mg/150 mL  Status:  Discontinued        750 mg 150 mL/hr over 60 Minutes Intravenous Every 8 hours 08/10/19 1254 08/11/19 1159   08/08/19  2042  bacitracin 50,000 Units in sodium chloride 0.9 % 500 mL irrigation  Status:  Discontinued          As needed 08/08/19 2043 08/08/19 2350   08/08/19 1800  vancomycin (VANCOCIN) IVPB 1000 mg/200 mL premix  Status:  Discontinued        1,000 mg 200 mL/hr over 60 Minutes Intravenous Every 12 hours 08/08/19 0644 08/10/19 0919   08/08/19 0800   levofloxacin (LEVAQUIN) IVPB 750 mg  Status:  Discontinued        750 mg 100 mL/hr over 90 Minutes Intravenous Every 24 hours 08/08/19 0644 08/09/19 0115   08/08/19 0645  vancomycin (VANCOREADY) IVPB 1250 mg/250 mL        1,250 mg 166.7 mL/hr over 90 Minutes Intravenous  Once 08/08/19 2409 08/08/19 0916       Subjective: Patient seen and examined at bedside.  Poor historian.  Complains of neck and shoulder pain but states that she is able to move her extremities.  No fever, vomiting reported. Objective: Vitals:   08/11/19 1652 08/11/19 2026 08/12/19 0003 08/12/19 0356  BP: 135/85 136/79 (!) 147/81 (!) 145/92  Pulse: 85 88 90 84  Resp: 18 17 17 17   Temp: 98.2 F (36.8 C) 99.3 F (37.4 C) 98.6 F (37 C) 98 F (36.7 C)  TempSrc: Oral Oral Oral Oral  SpO2: 100% 100% 100% 99%  Weight:      Height:       No intake or output data in the 24 hours ending 08/12/19 0733 Filed Weights   08/07/19 2205 08/08/19 1253 08/08/19 1823  Weight: 59 kg 59 kg 61.2 kg    Examination:  General exam: No distress.  Poor historian.  Looks older than stated age.  Looks chronically ill.    Respiratory system: Bilateral decreased breath sounds at bases, no wheezing cardiovascular system: Rate controlled, S1-S2 heard Gastrointestinal system: Abdomen is nondistended, soft and nontender.  Bowel sounds are heard  extremities: No no cyanosis or clubbing  Data Reviewed: I have personally reviewed following labs and imaging studies  CBC: Recent Labs  Lab 08/08/19 0007 08/08/19 0007 08/08/19 0727 08/09/19 0336 08/10/19 0704 08/11/19 1000 08/12/19 0418  WBC 19.4*   < > 18.9* 18.6* 13.2* 14.0* 15.0*  NEUTROABS 16.4*  --   --   --  10.6* 11.6* 11.7*  HGB 12.0   < > 11.8* 10.6* 9.3* 9.5* 10.2*  HCT 35.8*   < > 36.3 32.6* 28.8* 28.5* 30.2*  MCV 94.5   < > 95.0 96.2 97.6 93.1 91.2  PLT 347   < > 344 296 265 332 421*   < > = values in this interval not displayed.   Basic Metabolic Panel: Recent Labs    Lab 08/08/19 0007 08/08/19 0007 08/08/19 0727 08/09/19 0336 08/10/19 0704 08/11/19 1000 08/12/19 0418  NA 136  --   --  136 136 134* 135  K 3.5  --   --  3.8 3.5 3.3* 3.4*  CL 99  --   --  100 104 99 99  CO2 28  --   --  26 23 25 24   GLUCOSE 143*  --   --  141* 162* 127* 115*  BUN 11  --   --  6 7 5* 6  CREATININE 0.49   < > 0.55 0.54 0.48 0.47 0.38*  CALCIUM 9.0  --   --  8.8* 8.0* 8.3* 8.3*  MG  --   --   --  1.8 2.0 1.8 1.8  PHOS  --   --   --  3.6  --   --   --    < > = values in this interval not displayed.   GFR: Estimated Creatinine Clearance: 88.5 mL/min (A) (by C-G formula based on SCr of 0.38 mg/dL (L)). Liver Function Tests: Recent Labs  Lab 08/08/19 0007 08/09/19 0336 08/12/19 0418  AST 30 18 15   ALT 22 16 16   ALKPHOS 89 62 82  BILITOT 0.8 0.8 0.7  PROT 8.2* 6.2* 5.6*  ALBUMIN 4.5 2.8* 2.2*   No results for input(s): LIPASE, AMYLASE in the last 168 hours. No results for input(s): AMMONIA in the last 168 hours. Coagulation Profile: Recent Labs  Lab 08/09/19 0336  INR 1.2   Cardiac Enzymes: No results for input(s): CKTOTAL, CKMB, CKMBINDEX, TROPONINI in the last 168 hours. BNP (last 3 results) No results for input(s): PROBNP in the last 8760 hours. HbA1C: No results for input(s): HGBA1C in the last 72 hours. CBG: No results for input(s): GLUCAP in the last 168 hours. Lipid Profile: No results for input(s): CHOL, HDL, LDLCALC, TRIG, CHOLHDL, LDLDIRECT in the last 72 hours. Thyroid Function Tests: No results for input(s): TSH, T4TOTAL, FREET4, T3FREE, THYROIDAB in the last 72 hours. Anemia Panel: No results for input(s): VITAMINB12, FOLATE, FERRITIN, TIBC, IRON, RETICCTPCT in the last 72 hours. Sepsis Labs: No results for input(s): PROCALCITON, LATICACIDVEN in the last 168 hours.  Recent Results (from the past 240 hour(s))  Culture, blood (Routine X 2) w Reflex to ID Panel     Status: Abnormal   Collection Time: 08/08/19  7:27 AM   Specimen:  BLOOD RIGHT FOREARM  Result Value Ref Range Status   Specimen Description BLOOD RIGHT FOREARM  Final   Special Requests   Final    BOTTLES DRAWN AEROBIC AND ANAEROBIC Blood Culture results may not be optimal due to an inadequate volume of blood received in culture bottles   Culture  Setup Time   Final    IN BOTH AEROBIC AND ANAEROBIC BOTTLES GRAM POSITIVE COCCI IN CLUSTERS CRITICAL RESULT CALLED TO, READ BACK BY AND VERIFIED WITH: Karsten Ro [facility] 08/09/19 0102 JDW Performed at [facility], [address] [place], [place] [address]    Culture STAPHYLOCOCCUS AUREUS (A)  Final   Report Status 08/10/2019 FINAL  Final   Organism ID, Bacteria STAPHYLOCOCCUS AUREUS  Final      Susceptibility   Staphylococcus aureus - MIC*    CIPROFLOXACIN <=0.5 SENSITIVE Sensitive     ERYTHROMYCIN >=8 RESISTANT Resistant     GENTAMICIN <=0.5 SENSITIVE Sensitive     OXACILLIN 0.5 SENSITIVE Sensitive     TETRACYCLINE <=1 SENSITIVE Sensitive     VANCOMYCIN <=0.5 SENSITIVE Sensitive     TRIMETH/SULFA <=10 SENSITIVE Sensitive     CLINDAMYCIN <=0.25 SENSITIVE Sensitive     RIFAMPIN <=0.5 SENSITIVE Sensitive     Inducible Clindamycin NEGATIVE Sensitive     * STAPHYLOCOCCUS AUREUS  Blood Culture ID Panel (Reflexed)     Status: Abnormal   Collection Time: 08/08/19  7:27 AM  Result Value Ref Range Status   Enterococcus species NOT DETECTED NOT DETECTED Final   Listeria monocytogenes NOT DETECTED NOT DETECTED Final   Staphylococcus species DETECTED (A) NOT DETECTED Final    Comment: CRITICAL RESULT CALLED TO, READ BACK BY AND VERIFIED WITH: J LEDFORD PHARMD 08/09/19 0102 JDW    Staphylococcus aureus (BCID) DETECTED (A) NOT DETECTED Final  Comment: Methicillin (oxacillin) susceptible Staphylococcus aureus (MSSA). Preferred therapy is anti staphylococcal beta lactam antibiotic (Cefazolin or Nafcillin), unless clinically contraindicated. CRITICAL RESULT CALLED TO, READ BACK BY AND VERIFIED WITH: J LEDFORD  [facility] 08/09/19 0102 JDW    Methicillin resistance NOT DETECTED NOT DETECTED Final   Streptococcus species NOT DETECTED NOT DETECTED Final   Streptococcus agalactiae NOT DETECTED NOT DETECTED Final   Streptococcus pneumoniae NOT DETECTED NOT DETECTED Final   Streptococcus pyogenes NOT DETECTED NOT DETECTED Final   Acinetobacter baumannii NOT DETECTED NOT DETECTED Final   Enterobacteriaceae species NOT DETECTED NOT DETECTED Final   Enterobacter cloacae complex NOT DETECTED NOT DETECTED Final   Escherichia coli NOT DETECTED NOT DETECTED Final   Klebsiella oxytoca NOT DETECTED NOT DETECTED Final   Klebsiella pneumoniae NOT DETECTED NOT DETECTED Final   Proteus species NOT DETECTED NOT DETECTED Final   Serratia marcescens NOT DETECTED NOT DETECTED Final   Haemophilus influenzae NOT DETECTED NOT DETECTED Final   Neisseria meningitidis NOT DETECTED NOT DETECTED Final   Pseudomonas aeruginosa NOT DETECTED NOT DETECTED Final   Candida albicans NOT DETECTED NOT DETECTED Final   Candida glabrata NOT DETECTED NOT DETECTED Final   Candida krusei NOT DETECTED NOT DETECTED Final   Candida parapsilosis NOT DETECTED NOT DETECTED Final   Candida tropicalis NOT DETECTED NOT DETECTED Final    Comment: Performed at [facility], [place]. [address] [place], [place] [address]  SARS Coronavirus 2 by RT PCR (hospital order, performed in [facility] hospital lab) Nasopharyngeal Nasopharyngeal Swab     Status: None   Collection Time: 08/08/19  9:02 AM   Specimen: Nasopharyngeal Swab  Result Value Ref Range Status   SARS Coronavirus 2 NEGATIVE NEGATIVE Final    Comment: (NOTE) SARS-CoV-2 target nucleic acids are NOT DETECTED.  The SARS-CoV-2 RNA is generally detectable in upper and lower respiratory specimens during the acute phase of infection. The lowest concentration of SARS-CoV-2 viral copies this assay can detect is 250 copies / mL. A negative result does not preclude SARS-CoV-2 infection and should  not be used as the sole basis for treatment or other patient management decisions.  A negative result may occur with improper specimen collection / handling, submission of specimen other than nasopharyngeal swab, presence of viral mutation(s) within the areas targeted by this assay, and inadequate number of viral copies (<250 copies / mL). A negative result must be combined with clinical observations, patient history, and epidemiological information.  Fact Sheet for Patients:   StrictlyIdeas.no  Fact Sheet for Healthcare Providers: BankingDealers.co.za  This test is not yet approved or  cleared by the [place] FDA and has been authorized for detection and/or diagnosis of SARS-CoV-2 by FDA under an Emergency Use Authorization (EUA).  This EUA will remain in effect (meaning this test can be used) for the duration of the COVID-19 declaration under Section 564(b)(1) of the Act, 21 U.S.C. section 360bbb-3(b)(1), unless the authorization is terminated or revoked sooner.  Performed at [facility], [place] [address] [place], [place] [address]   Culture, blood (Routine X 2) w Reflex to ID Panel     Status: Abnormal   Collection Time: 08/08/19  5:11 PM   Specimen: BLOOD RIGHT HAND  Result Value Ref Range Status   Specimen Description BLOOD RIGHT HAND  Final   Special Requests   Final    BOTTLES DRAWN AEROBIC AND ANAEROBIC Blood Culture adequate volume   Culture  Setup Time   Final  GRAM POSITIVE COCCI IN CLUSTERS IN BOTH AEROBIC AND ANAEROBIC BOTTLES CRITICAL RESULT CALLED TO, READ BACK BY AND VERIFIED WITH: J. LEDFORD,PHARMD 0503 08/09/2019 T. TYSOR    Culture (A)  Final    STAPHYLOCOCCUS AUREUS SUSCEPTIBILITIES PERFORMED ON PREVIOUS CULTURE WITHIN THE LAST 5 DAYS. Performed at [facility], [place] [address], [place], [place] [address]    Report Status 08/11/2019 FINAL  Final  Culture, blood (routine x 2)     Status: None  (Preliminary result)   Collection Time: 08/10/19  7:04 AM   Specimen: BLOOD  Result Value Ref Range Status   Specimen Description BLOOD RIGHT ANTECUBITAL  Final   Special Requests   Final    BOTTLES DRAWN AEROBIC ONLY Blood Culture results may not be optimal due to an inadequate volume of blood received in culture bottles   Culture   Final    NO GROWTH 1 DAY Performed at [facility], [place] [address] [place], [place] [address]    Report Status PENDING  Incomplete  Culture, blood (routine x 2)     Status: None (Preliminary result)   Collection Time: 08/10/19  7:04 AM   Specimen: BLOOD RIGHT HAND  Result Value Ref Range Status   Specimen Description BLOOD RIGHT HAND  Final   Special Requests   Final    BOTTLES DRAWN AEROBIC ONLY Blood Culture adequate volume   Culture   Final    NO GROWTH 1 DAY Performed at [facility], [place] [address] [place],  [address]    Report Status PENDING  Incomplete         Radiology Studies: No results found.      Scheduled Meds: . Chlorhexidine Gluconate Cloth  6 each Topical Daily  . cloNIDine  0.1 mg Oral BH-qamhs   Followed by  . [START ON 08/13/2019] cloNIDine  0.1 mg Oral QAC breakfast  . enoxaparin (LOVENOX) injection  40 mg Subcutaneous Q24H  . folic acid  1 mg Oral Daily  . multivitamin with minerals  1 tablet Oral Daily  . nicotine  14 mg Transdermal Daily  . senna-docusate  1 tablet Oral BID  . thiamine  100 mg Oral Daily   Continuous Infusions: .  ceFAZolin (ANCEF) IV 2 g (08/12/19 0612)          Aline August, MD [facility] 08/12/2019, 7:33 AM

## 2019-08-12 NOTE — Progress Notes (Signed)
Neurosurgery Service Progress Note  Subjective: No acute events overnight, stable strength / numbness / neck pain  Objective: Vitals:   08/12/19 0003 08/12/19 0356 08/12/19 0756 08/12/19 1202  BP: (!) 147/81 (!) 145/92 (!) 151/90 (!) 155/96  Pulse: 90 84 85 80  Resp: 17 17 18 18   Temp: 98.6 F (37 C) 98 F (36.7 C) 98.1 F (36.7 C) 97.8 F (36.6 C)  TempSrc: Oral Oral Oral Oral  SpO2: 100% 99% 100% 100%  Weight:      Height:       Temp (24hrs), Avg:98.3 F (36.8 C), Min:97.8 F (36.6 C), Max:99.3 F (37.4 C)  CBC Latest Ref Rng & Units 08/12/2019 08/11/2019 08/10/2019  WBC 4.0 - 10.5 K/uL 15.0(H) 14.0(H) 13.2(H)  Hemoglobin 12.0 - 15.0 g/dL 10.2(L) 9.5(L) 9.3(L)  Hematocrit 36 - 46 % 30.2(L) 28.5(L) 28.8(L)  Platelets 150 - 400 K/uL 421(H) 332 265   BMP Latest Ref Rng & Units 08/12/2019 08/11/2019 08/10/2019  Glucose 70 - 99 mg/dL 115(H) 127(H) 162(H)  BUN 6 - 20 mg/dL 6 5(L) 7  Creatinine 0.44 - 1.00 mg/dL 0.38(L) 0.47 0.48  Sodium 135 - 145 mmol/L 135 134(L) 136  Potassium 3.5 - 5.1 mmol/L 3.4(L) 3.3(L) 3.5  Chloride 98 - 111 mmol/L 99 99 104  CO2 22 - 32 mmol/L 24 25 23   Calcium 8.9 - 10.3 mg/dL 8.3(L) 8.3(L) 8.0(L)    Intake/Output Summary (Last 24 hours) at 08/12/2019 1551 Last data filed at 08/12/2019 9024 Gross per 24 hour  Intake --  Output 650 ml  Net -650 ml    Current Facility-Administered Medications:    acetaminophen (TYLENOL) tablet 650 mg, 650 mg, Oral, Q6H PRN, 650 mg at 08/10/19 0124 **OR** acetaminophen (TYLENOL) suppository 650 mg, 650 mg, Rectal, Q6H PRN, Adefeso, Oladapo, DO   bisacodyl (DULCOLAX) suppository 10 mg, 10 mg, Rectal, Daily PRN, Starla Link, Kshitiz, MD   ceFAZolin (ANCEF) IVPB 2g/100 mL premix, 2 g, Intravenous, Q8H, Tommy Medal, Lavell Islam, MD, Last Rate: 200 mL/hr at 08/12/19 1356, 2 g at 08/12/19 1356   Chlorhexidine Gluconate Cloth 2 % PADS 6 each, 6 each, Topical, Daily, Courvoisier Hamblen, Joyice Faster, MD, 6 each at 08/12/19 1000   [EXPIRED] cloNIDine  (CATAPRES) tablet 0.1 mg, 0.1 mg, Oral, QID, 0.1 mg at 08/10/19 2123 **FOLLOWED BY** cloNIDine (CATAPRES) tablet 0.1 mg, 0.1 mg, Oral, BH-qamhs, 0.1 mg at 08/12/19 0854 **FOLLOWED BY** [START ON 08/13/2019] cloNIDine (CATAPRES) tablet 0.1 mg, 0.1 mg, Oral, QAC breakfast, Alekh, Kshitiz, MD   dicyclomine (BENTYL) tablet 20 mg, 20 mg, Oral, Q6H PRN, Starla Link, Kshitiz, MD   enoxaparin (LOVENOX) injection 40 mg, 40 mg, Subcutaneous, Q24H, Adefeso, Oladapo, DO, 40 mg at 09/73/53 2992   folic acid (FOLVITE) tablet 1 mg, 1 mg, Oral, Daily, Shela Leff, MD, 1 mg at 08/12/19 0854   HYDROmorphone (DILAUDID) injection 1-2 mg, 1-2 mg, Intravenous, Q2H PRN, Starla Link, Kshitiz, MD, 1 mg at 08/12/19 1352   hydrOXYzine (ATARAX/VISTARIL) tablet 25 mg, 25 mg, Oral, Q6H PRN, Starla Link, Kshitiz, MD, 25 mg at 08/11/19 2122   loperamide (IMODIUM) capsule 2-4 mg, 2-4 mg, Oral, PRN, Starla Link, Kshitiz, MD   LORazepam (ATIVAN) tablet 1-4 mg, 1-4 mg, Oral, Q1H PRN **OR** LORazepam (ATIVAN) injection 1-4 mg, 1-4 mg, Intravenous, Q1H PRN, Shela Leff, MD, 2 mg at 08/12/19 0026   methocarbamol (ROBAXIN) tablet 750 mg, 750 mg, Oral, Q6H PRN, Starla Link, Kshitiz, MD, 750 mg at 08/11/19 2122   multivitamin with minerals tablet 1 tablet, 1 tablet, Oral, Daily, Rathore,  Wandra Feinstein, MD, 1 tablet at 08/12/19 0855   naproxen (NAPROSYN) tablet 500 mg, 500 mg, Oral, BID PRN, Aline August, MD, 500 mg at 08/12/19 0026   nicotine (NICODERM CQ - dosed in mg/24 hours) patch 14 mg, 14 mg, Transdermal, Daily, Blount, Xenia T, NP, 14 mg at 08/12/19 0856   ondansetron (ZOFRAN-ODT) disintegrating tablet 4 mg, 4 mg, Oral, Q6H PRN, Starla Link, Kshitiz, MD   oxyCODONE (Oxy IR/ROXICODONE) immediate release tablet 5-10 mg, 5-10 mg, Oral, Q4H PRN, Starla Link, Kshitiz, MD, 10 mg at 08/12/19 1205   polyethylene glycol (MIRALAX / GLYCOLAX) packet 17 g, 17 g, Oral, Daily PRN, Starla Link, Kshitiz, MD   rifampin (RIFADIN) capsule 300 mg, 300 mg, Oral, BID WC, Calone,  Ples Specter, FNP   senna-docusate (Senokot-S) tablet 1 tablet, 1 tablet, Oral, BID, Starla Link, Kshitiz, MD, 1 tablet at 08/12/19 0855   thiamine tablet 100 mg, 100 mg, Oral, Daily, 100 mg at 08/12/19 0855 **OR** [DISCONTINUED] thiamine (B-1) injection 100 mg, 100 mg, Intravenous, Daily, Shela Leff, MD   Physical Exam: Strength diffusely 4/5, diffusely decreased sensation x4, +R hoffman's, reflexes 2+ with no ankle clonus, incision c/d/i  Assessment & Plan: 44 y.o. woman s/p C3-7 lami C3-6 PSIF, recovering well.  -activity as tolerated, no collar needed -exam improving, lower extremity reflexes / clonus resolved  Monica Eaton  08/12/19 3:51 PM

## 2019-08-13 LAB — CBC WITH DIFFERENTIAL/PLATELET
Abs Immature Granulocytes: 0.15 10*3/uL — ABNORMAL HIGH (ref 0.00–0.07)
Basophils Absolute: 0 10*3/uL (ref 0.0–0.1)
Basophils Relative: 0 %
Eosinophils Absolute: 0 10*3/uL (ref 0.0–0.5)
Eosinophils Relative: 0 %
HCT: 31.6 % — ABNORMAL LOW (ref 36.0–46.0)
Hemoglobin: 11 g/dL — ABNORMAL LOW (ref 12.0–15.0)
Immature Granulocytes: 1 %
Lymphocytes Relative: 8 %
Lymphs Abs: 1.4 10*3/uL (ref 0.7–4.0)
MCH: 31.3 pg (ref 26.0–34.0)
MCHC: 34.8 g/dL (ref 30.0–36.0)
MCV: 90 fL (ref 80.0–100.0)
Monocytes Absolute: 1.3 10*3/uL — ABNORMAL HIGH (ref 0.1–1.0)
Monocytes Relative: 8 %
Neutro Abs: 14.1 10*3/uL — ABNORMAL HIGH (ref 1.7–7.7)
Neutrophils Relative %: 83 %
Platelets: 549 10*3/uL — ABNORMAL HIGH (ref 150–400)
RBC: 3.51 MIL/uL — ABNORMAL LOW (ref 3.87–5.11)
RDW: 12.2 % (ref 11.5–15.5)
WBC: 17 10*3/uL — ABNORMAL HIGH (ref 4.0–10.5)
nRBC: 0 % (ref 0.0–0.2)

## 2019-08-13 LAB — MAGNESIUM: Magnesium: 1.9 mg/dL (ref 1.7–2.4)

## 2019-08-13 LAB — BASIC METABOLIC PANEL
Anion gap: 11 (ref 5–15)
BUN: 7 mg/dL (ref 6–20)
CO2: 26 mmol/L (ref 22–32)
Calcium: 8.4 mg/dL — ABNORMAL LOW (ref 8.9–10.3)
Chloride: 96 mmol/L — ABNORMAL LOW (ref 98–111)
Creatinine, Ser: 0.44 mg/dL (ref 0.44–1.00)
GFR calc Af Amer: >60 mL/min (ref >60)
GFR calc non Af Amer: >60 mL/min (ref >60)
Glucose, Bld: 120 mg/dL — ABNORMAL HIGH (ref 70–99)
Potassium: 3.3 mmol/L — ABNORMAL LOW (ref 3.5–5.1)
Sodium: 133 mmol/L — ABNORMAL LOW (ref 135–145)

## 2019-08-13 LAB — C-REACTIVE PROTEIN: CRP: 14.9 mg/dL — ABNORMAL HIGH (ref <1.0)

## 2019-08-13 LAB — HEPATITIS B SURFACE ANTIBODY, QUANTITATIVE: Hep B S AB Quant (Post): <3.1 m[IU]/mL — ABNORMAL LOW (ref >9.9)

## 2019-08-13 MED ORDER — SODIUM CHLORIDE 0.9% FLUSH
10.0000 mL | INTRAVENOUS | Status: DC | PRN
  Administered 2019-08-14 – 2019-08-23 (×3): 10 mL

## 2019-08-13 MED ORDER — SODIUM CHLORIDE 0.9% FLUSH
10.0000 mL | Freq: Two times a day (BID) | INTRAVENOUS | Status: DC
  Administered 2019-08-13 – 2019-08-15 (×6): 10 mL
  Administered 2019-08-15 – 2019-08-16 (×2): 20 mL
  Administered 2019-08-16 – 2019-09-02 (×29): 10 mL
  Administered 2019-09-02: 20 mL
  Administered 2019-09-03 – 2019-09-07 (×6): 10 mL

## 2019-08-13 MED ORDER — POTASSIUM CHLORIDE 10 MEQ/100ML IV SOLN
10.0000 meq | INTRAVENOUS | Status: AC
  Administered 2019-08-13: 10 meq via INTRAVENOUS
  Filled 2019-08-13 (×2): qty 100

## 2019-08-13 NOTE — Progress Notes (Signed)
Patient up to bedside toliete had a very large BM. Neck started draining again. Peri pad place a area of leaking. Placed back in bed with alarms on and call light within reach.

## 2019-08-13 NOTE — Evaluation (Signed)
Physical Therapy Evaluation Patient Details Name: Monica Eaton MRN: [id number] DOB: [birth year] Today's Date: 08/13/2019   History of Present Illness  Monica Eaton is a 44 y.o. female with medical history significant for tobacco abuse and IV drug abuse who presents to the emergency department due to sudden onset of upper back pain which started about 3 -4 hours PTA.  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 Pt underwent, s/p C3-6 OLaminectomy & fusion.     Clinical Impression  Pt admitted with above. Pt poor historian with inconsistency in report of PLOF and support. Pt sleepy, confused, yet reporting 10/10 neck pain and requesting pain meds despite having received them. Pt also with noted significant drainage from neck incision, RN and Charge RN present to assess and dress. MD also notified. Pt anxious and impulsive requiring modAx2 for transfer to [facility] to have BM and back to bed. Pt with noted bilat UE and LE weakness and impaired co-ordination. Pt states she is going to stay with parents however unsure of its accuracy. Recommending  SNF upon d/c to achieve safe mod I level of function.    Follow Up Recommendations SNF;Supervision/Assistance - 24 hour    Equipment Recommendations  Rolling walker with 5" wheels    Recommendations for Other Services       Precautions / Restrictions Precautions Precautions: Cervical Precaution Booklet Issued: Yes (comment) Precaution Comments: with impaired cognition pt unable to comprehend or recall precautions Required Braces or Orthoses:  (per Dr. Zada Finders pt doesn't need cervical collar) Restrictions Weight Bearing Restrictions: No      Mobility  Bed Mobility Overal bed mobility: Needs Assistance Bed Mobility: Rolling;Sidelying to Sit;Sit to Sidelying Rolling: Mod assist Sidelying to sit: Mod assist     Sit to sidelying: Mod assist General bed mobility comments: max  directional verbal and tactile cues to complete task,, assist for trunk management  Transfers Overall transfer level: Needs assistance Equipment used: 2 person hand held assist Transfers: Sit to/from Stand;Stand Pivot Transfers Sit to Stand: Mod assist;+2 physical assistance Stand pivot transfers: Mod assist;+2 physical assistance       General transfer comment: modAx2 to power up, max directional and tactile cues to complete std pvt to [facility] and then back to bed, pt with impaired stepping sequencing requiring modA to weight shift and advance steps towards bed  Ambulation/Gait             General Gait Details: unable this date  Stairs            Wheelchair Mobility    Modified Rankin (Stroke Patients Only)       Balance Overall balance assessment: Needs assistance Sitting-balance support: Feet supported;Bilateral upper extremity supported Sitting balance-Leahy Scale: Poor Sitting balance - Comments: pt with significant posterior lean requiring maxA to maintain upright position   Standing balance support: Bilateral upper extremity supported Standing balance-Leahy Scale: Poor Standing balance comment: dependent on physical assist                             Pertinent Vitals/Pain Pain Assessment: Faces Faces Pain Scale: Hurts worst Pain Location: neck Pain Descriptors / Indicators:  (moaning) Pain Intervention(s): Premedicated before session (pt groaning and requesting pain meds)    Home Living Family/patient expects to be discharged to:: Unsure                 Additional Comments: pt poor historian with inconsistent  report. Pt states she was living alone in [place] working for [employer] but she will stay with her parents in [place] once she leaves the hospital. pt states "they will have a 1 story home with no stairs, they're moving in 2-54months"    Prior Function Level of Independence: Independent         Comments: suspect indep  but unsure due to pt poor historian     Hand Dominance        Extremity/Trunk Assessment   Upper Extremity Assessment Upper Extremity Assessment: Generalized weakness    Lower Extremity Assessment Lower Extremity Assessment: Generalized weakness (bilat LE with impaired co-ordination)    Cervical / Trunk Assessment Cervical / Trunk Assessment: Other exceptions Cervical / Trunk Exceptions: neck surgery  Communication   Communication: No difficulties  Cognition Arousal/Alertness: Awake/alert (but became sleepy shortly after receiving pain meds) Behavior During Therapy: Anxious;Impulsive Overall Cognitive Status: Impaired/Different from baseline Area of Impairment: Orientation;Attention;Memory;Following commands;Safety/judgement;Awareness;Problem solving                 Orientation Level: Disoriented to;Place;Time Current Attention Level: Focused Memory: Decreased short-term memory;Decreased recall of precautions Following Commands: Follows one step commands with increased time;Follows one step commands inconsistently Safety/Judgement: Decreased awareness of safety;Decreased awareness of deficits Awareness: Emergent ("i have to go poop") Problem Solving: Slow processing;Decreased initiation;Difficulty sequencing;Requires verbal cues;Requires tactile cues General Comments: Pt with decreased insight to drainage coming out of neck incision,  pt stating shew as in [place] and it was april however when given 2 choices pt picking accurately, pt difficulty sequencing transfer technique and comprehending steps to complete task safely. s/p receiving oral pain meds pt becoming sleepy      General Comments General comments (skin integrity, edema, etc.): pt with lots of track marks from IV use    Exercises     Assessment/Plan    PT Assessment Patient needs continued PT services  PT Problem List Decreased strength;Decreased range of motion;Decreased activity tolerance;Decreased  balance;Decreased mobility;Decreased coordination;Decreased cognition;Decreased knowledge of use of DME       PT Treatment Interventions DME instruction;Gait training;Stair training;Functional mobility training;Therapeutic activities;Therapeutic exercise;Balance training    PT Goals (Current goals can be found in the Care Plan section)  Acute Rehab PT Goals PT Goal Formulation: Patient unable to participate in goal setting Time For Goal Achievement: 08/27/19 Potential to Achieve Goals: Fair    Frequency Min 5X/week   Barriers to discharge Decreased caregiver support unsure of support pt has    Co-evaluation               AM-PAC PT "6 Clicks" Mobility  Outcome Measure Help needed turning from your back to your side while in a flat bed without using bedrails?: A Lot Help needed moving from lying on your back to sitting on the side of a flat bed without using bedrails?: A Lot Help needed moving to and from a bed to a chair (including a wheelchair)?: A Lot Help needed standing up from a chair using your arms (e.g., wheelchair or bedside chair)?: A Lot Help needed to walk in hospital room?: A Lot Help needed climbing 3-5 steps with a railing? : A Lot 6 Click Score: 12    End of Session Equipment Utilized During Treatment: Gait belt Activity Tolerance: Patient limited by pain;Patient limited by lethargy Patient left: in bed;with call bell/phone within reach;with bed alarm set;with nursing/sitter in room Nurse Communication: Mobility status PT Visit Diagnosis: Unsteadiness on feet (R26.81);Difficulty in walking, not elsewhere  classified (R26.2)    Time: 1484-0397 PT Time Calculation (min) (ACUTE ONLY): 47 min   Charges:   PT Evaluation $PT Eval Moderate Complexity: 1 Mod PT Treatments $Gait Training: 8-22 mins $Therapeutic Activity: 8-22 mins        Kittie Plater, PT, DPT Acute Rehabilitation Services Pager #: [phone] Office #: [phone]   Berline Lopes 08/13/2019, 9:25 AM

## 2019-08-13 NOTE — Progress Notes (Signed)
PROGRESS NOTE    Monica Eaton  TKP:[id number] DOB: 03-Nov-1975 DOA: 08/07/2019 PCP: Patient, No Pcp Per   Brief Narrative:  44 year old female with history of tobacco abuse and IV drug abuse presented with severe neck pain and upper back pain.  In the ED, she had leukocytosis with CRP of 9.4.  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 C2-3 nonsegmentation and 3 mm of anterolisthesis at C3-4 was also noted. An attempt for MRI cervical spine with contrast was unsuccessful due to patient not being able to lay still despite IV Dilaudid, Ativan and Valium.  She was subsequently transferred to [facility] for MRI under general anesthesia.  She was started on broad-spectrum IV antibiotics.  Neurosurgery was consulted.  After MRI of spine under general anesthesia, patient underwent surgical intervention by neurosurgery on 08/08/2019. She was subsequently found to have MSSA bacteremia and ID was consulted.    Assessment & Plan:   Principal Problem:   Cervical pain (neck) Active Problems:   IVDU (intravenous drug user)   Tobacco abuse   Neck pain   Bandemia   MSSA bacteremia   Epidural abscess   Discitis of cervical region   Hardware complicating wound infection ([place])   Severe neck pain secondary to acute cervical discitis/osteomyelitis MSSA bacteremia, POA -MRI cervical spine shows acute cervical discitis/osteomyelitis.  Underwent surgical intervention by neurosurgery -Seen by infectious disease-on Ancef and rifampin -Repeat cultures thus far remains negative.  Will remain in the hospital until her antibiotic regimen is completed.  Last day of antibiotics still to be determined -Some bleeding around her dressing, advised nursing staff to inform neurosurgery team  Polysubstance abuse Tobacco use -Urine drug screen is positive  for benzodiazepines, opiates and tetrahydrocannabinol.  Counseled to quit using this.   DVT prophylaxis: Lovenox Code Status: Full Family Communication: None at bedside.   Disposition Plan: Status is: Inpatient  Remains inpatient appropriate because:Inpatient level of care appropriate due to severity of illness.  Might need inpatient 6 weeks of IV antibiotics   Dispo: The patient is from: Home  Anticipated d/c is to: Undermined  Anticipated d/c date is: > 3 days  Patient currently is not medically stable to d/c.   Body mass index is 21.13 kg/m.    Subjective: Seen and examined this morning, patient was sleepy but did not have any complaints besides some discomfort surgical site in her neck  Review of Systems Otherwise negative except as per HPI, including: General = no fevers, chills, dizziness,  fatigue HEENT/EYES = negative for loss of vision, double vision, blurred vision,  sore throa Cardiovascular= negative for chest pain, palpitation Respiratory/lungs= negative for shortness of breath, cough, wheezing; hemoptysis,  Gastrointestinal= negative for nausea, vomiting, abdominal pain Genitourinary= negative for Dysuria MSK = Negative for arthralgia, myalgias Neurology= Negative for headache, numbness, tingling  Psychiatry= Negative for suicidal and homocidal ideation Skin= Negative for Rash   Examination: Constitutional: Not in acute distress Respiratory: Clear to auscultation bilaterally Cardiovascular: Normal sinus rhythm, no rubs Abdomen: Nontender nondistended good bowel sounds Musculoskeletal: No edema noted Skin: Surgical site in her neck noted without any evidence of active bleeding during my evaluation Neurologic: CN 2-12 grossly intact.  And nonfocal Psychiatric: Normal judgment and insight. Alert and oriented x 3. Normal mood.     Objective: Vitals:   08/13/19 0400 08/13/19 0800 08/13/19 1200 08/13/19 1330  BP:  (!) 154/94 Marland Kitchen)  158/87 (!) 152/89 (!) 165/94  Pulse: 90 (!) 109 97 (!) 108  Resp: 18     Temp: 98.7 F (37.1 C) 98.2 F (36.8 C)    TempSrc: Oral Oral    SpO2: 99% 99% 100% 99%  Weight:      Height:        Intake/Output Summary (Last 24 hours) at 08/13/2019 1430 Last data filed at 08/13/2019 0600 Gross per 24 hour  Intake 100 ml  Output 300 ml  Net -200 ml   Filed Weights   08/07/19 2205 08/08/19 1253 08/08/19 1823  Weight: 59 kg 59 kg 61.2 kg     Data Reviewed:   CBC: Recent Labs  Lab 08/08/19 0007 08/08/19 0727 08/09/19 0336 08/10/19 0704 08/11/19 1000 08/12/19 0418 08/13/19 0418  WBC 19.4*   < > 18.6* 13.2* 14.0* 15.0* 17.0*  NEUTROABS 16.4*  --   --  10.6* 11.6* 11.7* 14.1*  HGB 12.0   < > 10.6* 9.3* 9.5* 10.2* 11.0*  HCT 35.8*   < > 32.6* 28.8* 28.5* 30.2* 31.6*  MCV 94.5   < > 96.2 97.6 93.1 91.2 90.0  PLT 347   < > 296 265 332 421* 549*   < > = values in this interval not displayed.   Basic Metabolic Panel: Recent Labs  Lab 08/09/19 0336 08/10/19 0704 08/11/19 1000 08/12/19 0418 08/13/19 0418  NA 136 136 134* 135 133*  K 3.8 3.5 3.3* 3.4* 3.3*  CL 100 104 99 99 96*  CO2 26 23 25 24 26   GLUCOSE 141* 162* 127* 115* 120*  BUN 6 7 5* 6 7  CREATININE 0.54 0.48 0.47 0.38* 0.44  CALCIUM 8.8* 8.0* 8.3* 8.3* 8.4*  MG 1.8 2.0 1.8 1.8 1.9  PHOS 3.6  --   --   --   --    GFR: Estimated Creatinine Clearance: 88.5 mL/min (by C-G formula based on SCr of 0.44 mg/dL). Liver Function Tests: Recent Labs  Lab 08/08/19 0007 08/09/19 0336 08/12/19 0418  AST 30 18 15   ALT 22 16 16   ALKPHOS 89 62 82  BILITOT 0.8 0.8 0.7  PROT 8.2* 6.2* 5.6*  ALBUMIN 4.5 2.8* 2.2*   No results for input(s): LIPASE, AMYLASE in the last 168 hours. No results for input(s): AMMONIA in the last 168 hours. Coagulation Profile: Recent Labs  Lab 08/09/19 0336  INR 1.2   Cardiac Enzymes: No results for input(s): CKTOTAL, CKMB, CKMBINDEX, TROPONINI in the last 168 hours. BNP  (last 3 results) No results for input(s): PROBNP in the last 8760 hours. HbA1C: No results for input(s): HGBA1C in the last 72 hours. CBG: No results for input(s): GLUCAP in the last 168 hours. Lipid Profile: No results for input(s): CHOL, HDL, LDLCALC, TRIG, CHOLHDL, LDLDIRECT in the last 72 hours. Thyroid Function Tests: No results for input(s): TSH, T4TOTAL, FREET4, T3FREE, THYROIDAB in the last 72 hours. Anemia Panel: No results for input(s): VITAMINB12, FOLATE, FERRITIN, TIBC, IRON, RETICCTPCT in the last 72 hours. Sepsis Labs: No results for input(s): PROCALCITON, LATICACIDVEN in the last 168 hours.  Recent Results (from the past 240 hour(s))  Culture, blood (Routine X 2) w Reflex to ID Panel     Status: Abnormal   Collection Time: 08/08/19  7:27 AM   Specimen: BLOOD RIGHT FOREARM  Result Value Ref Range Status   Specimen Description BLOOD RIGHT FOREARM  Final   Special Requests   Final    BOTTLES DRAWN AEROBIC AND ANAEROBIC Blood Culture results  may not be optimal due to an inadequate volume of blood received in culture bottles   Culture  Setup Time   Final    IN BOTH AEROBIC AND ANAEROBIC BOTTLES GRAM POSITIVE COCCI IN CLUSTERS CRITICAL RESULT CALLED TO, READ BACK BY AND VERIFIED WITH: Karsten Ro [facility] 08/09/19 0102 JDW Performed at [facility], [address] [place], [place] [address]    Culture STAPHYLOCOCCUS AUREUS (A)  Final   Report Status 08/10/2019 FINAL  Final   Organism ID, Bacteria STAPHYLOCOCCUS AUREUS  Final      Susceptibility   Staphylococcus aureus - MIC*    CIPROFLOXACIN <=0.5 SENSITIVE Sensitive     ERYTHROMYCIN >=8 RESISTANT Resistant     GENTAMICIN <=0.5 SENSITIVE Sensitive     OXACILLIN 0.5 SENSITIVE Sensitive     TETRACYCLINE <=1 SENSITIVE Sensitive     VANCOMYCIN <=0.5 SENSITIVE Sensitive     TRIMETH/SULFA <=10 SENSITIVE Sensitive     CLINDAMYCIN <=0.25 SENSITIVE Sensitive     RIFAMPIN <=0.5 SENSITIVE Sensitive     Inducible Clindamycin  NEGATIVE Sensitive     * STAPHYLOCOCCUS AUREUS  Blood Culture ID Panel (Reflexed)     Status: Abnormal   Collection Time: 08/08/19  7:27 AM  Result Value Ref Range Status   Enterococcus species NOT DETECTED NOT DETECTED Final   Listeria monocytogenes NOT DETECTED NOT DETECTED Final   Staphylococcus species DETECTED (A) NOT DETECTED Final    Comment: CRITICAL RESULT CALLED TO, READ BACK BY AND VERIFIED WITH: J LEDFORD PHARMD 08/09/19 0102 JDW    Staphylococcus aureus (BCID) DETECTED (A) NOT DETECTED Final    Comment: Methicillin (oxacillin) susceptible Staphylococcus aureus (MSSA). Preferred therapy is anti staphylococcal beta lactam antibiotic (Cefazolin or Nafcillin), unless clinically contraindicated. CRITICAL RESULT CALLED TO, READ BACK BY AND VERIFIED WITH: J LEDFORD [facility] 08/09/19 0102 JDW    Methicillin resistance NOT DETECTED NOT DETECTED Final   Streptococcus species NOT DETECTED NOT DETECTED Final   Streptococcus agalactiae NOT DETECTED NOT DETECTED Final   Streptococcus pneumoniae NOT DETECTED NOT DETECTED Final   Streptococcus pyogenes NOT DETECTED NOT DETECTED Final   Acinetobacter baumannii NOT DETECTED NOT DETECTED Final   Enterobacteriaceae species NOT DETECTED NOT DETECTED Final   Enterobacter cloacae complex NOT DETECTED NOT DETECTED Final   Escherichia coli NOT DETECTED NOT DETECTED Final   Klebsiella oxytoca NOT DETECTED NOT DETECTED Final   Klebsiella pneumoniae NOT DETECTED NOT DETECTED Final   Proteus species NOT DETECTED NOT DETECTED Final   Serratia marcescens NOT DETECTED NOT DETECTED Final   Haemophilus influenzae NOT DETECTED NOT DETECTED Final   Neisseria meningitidis NOT DETECTED NOT DETECTED Final   Pseudomonas aeruginosa NOT DETECTED NOT DETECTED Final   Candida albicans NOT DETECTED NOT DETECTED Final   Candida glabrata NOT DETECTED NOT DETECTED Final   Candida krusei NOT DETECTED NOT DETECTED Final   Candida parapsilosis NOT DETECTED NOT DETECTED Final    Candida tropicalis NOT DETECTED NOT DETECTED Final    Comment: Performed at [facility], [place]. [address] [place], [place] [address]  SARS Coronavirus 2 by RT PCR (hospital order, performed in [facility] hospital lab) Nasopharyngeal Nasopharyngeal Swab     Status: None   Collection Time: 08/08/19  9:02 AM   Specimen: Nasopharyngeal Swab  Result Value Ref Range Status   SARS Coronavirus 2 NEGATIVE NEGATIVE Final    Comment: (NOTE) SARS-CoV-2 target nucleic acids are NOT DETECTED.  The SARS-CoV-2 RNA is generally detectable in upper and lower respiratory  specimens during the acute phase of infection. The lowest concentration of SARS-CoV-2 viral copies this assay can detect is 250 copies / mL. A negative result does not preclude SARS-CoV-2 infection and should not be used as the sole basis for treatment or other patient management decisions.  A negative result may occur with improper specimen collection / handling, submission of specimen other than nasopharyngeal swab, presence of viral mutation(s) within the areas targeted by this assay, and inadequate number of viral copies (<250 copies / mL). A negative result must be combined with clinical observations, patient history, and epidemiological information.  Fact Sheet for Patients:   StrictlyIdeas.no  Fact Sheet for Healthcare Providers: BankingDealers.co.za  This test is not yet approved or  cleared by the [place] FDA and has been authorized for detection and/or diagnosis of SARS-CoV-2 by FDA under an Emergency Use Authorization (EUA).  This EUA will remain in effect (meaning this test can be used) for the duration of the COVID-19 declaration under Section 564(b)(1) of the Act, 21 U.S.C. section 360bbb-3(b)(1), unless the authorization is terminated or revoked sooner.  Performed at [facility], [place] [address], [place], [place] [address]   Culture, blood (Routine  X 2) w Reflex to ID Panel     Status: Abnormal   Collection Time: 08/08/19  5:11 PM   Specimen: BLOOD RIGHT HAND  Result Value Ref Range Status   Specimen Description BLOOD RIGHT HAND  Final   Special Requests   Final    BOTTLES DRAWN AEROBIC AND ANAEROBIC Blood Culture adequate volume   Culture  Setup Time   Final    GRAM POSITIVE COCCI IN CLUSTERS IN BOTH AEROBIC AND ANAEROBIC BOTTLES CRITICAL RESULT CALLED TO, READ BACK BY AND VERIFIED WITH: J. LEDFORD,PHARMD 0503 08/09/2019 T. TYSOR    Culture (A)  Final    STAPHYLOCOCCUS AUREUS SUSCEPTIBILITIES PERFORMED ON PREVIOUS CULTURE WITHIN THE LAST 5 DAYS. Performed at [facility], [place] [address] [place], [place] [address]    Report Status 08/11/2019 FINAL  Final  Culture, blood (routine x 2)     Status: None (Preliminary result)   Collection Time: 08/10/19  7:04 AM   Specimen: BLOOD  Result Value Ref Range Status   Specimen Description BLOOD RIGHT ANTECUBITAL  Final   Special Requests   Final    BOTTLES DRAWN AEROBIC ONLY Blood Culture results may not be optimal due to an inadequate volume of blood received in culture bottles   Culture   Final    NO GROWTH 3 DAYS Performed at [facility], [place] [address], [place], [place] [address]    Report Status PENDING  Incomplete  Culture, blood (routine x 2)     Status: None (Preliminary result)   Collection Time: 08/10/19  7:04 AM   Specimen: BLOOD RIGHT HAND  Result Value Ref Range Status   Specimen Description BLOOD RIGHT HAND  Final   Special Requests   Final    BOTTLES DRAWN AEROBIC ONLY Blood Culture adequate volume   Culture   Final    NO GROWTH 3 DAYS Performed at [facility], [place] [address], [place], [place] [address]    Report Status PENDING  Incomplete         Radiology Studies: No results found.      Scheduled Meds: . Chlorhexidine Gluconate Cloth  6 each Topical Daily  . cloNIDine  0.1 mg Oral QAC breakfast  . enoxaparin (LOVENOX)  injection  40 mg  Subcutaneous Q24H  . folic acid  1 mg Oral Daily  . multivitamin with minerals  1 tablet Oral Daily  . nicotine  14 mg Transdermal Daily  . rifampin  300 mg Oral BID WC  . senna-docusate  1 tablet Oral BID  . thiamine  100 mg Oral Daily   Continuous Infusions: .  ceFAZolin (ANCEF) IV Stopped (08/13/19 1407)     LOS: 5 days   Time spent= 35 mins    Romelle Reiley Arsenio Loader, MD [facility]  If 7PM-7AM, please contact night-coverage  08/13/2019, 2:30 PM

## 2019-08-13 NOTE — Progress Notes (Signed)
Doctor in room assessed neck incision for drainage. HOB raised for patient comfort.

## 2019-08-13 NOTE — Progress Notes (Signed)
Neurosurgery Service Progress Note  Subjective: No acute events overnight, fell off the bedside commode and had some drainage from her incision, otherwise no new complaints  Objective: Vitals:   08/12/19 2000 08/13/19 0000 08/13/19 0400 08/13/19 0800  BP: 136/84 (!) 151/93 (!) 154/94 (!) 158/87  Pulse: 94 92 90 (!) 109  Resp: 20 20 18    Temp: 98.1 F (36.7 C) 98.6 F (37 C) 98.7 F (37.1 C) 98.2 F (36.8 C)  TempSrc: Oral Oral Oral Oral  SpO2: 99% 100% 99% 99%  Weight:      Height:       Temp (24hrs), Avg:98.2 F (36.8 C), Min:97.8 F (36.6 C), Max:98.7 F (37.1 C)  CBC Latest Ref Rng & Units 08/13/2019 08/12/2019 08/11/2019  WBC 4.0 - 10.5 K/uL 17.0(H) 15.0(H) 14.0(H)  Hemoglobin 12.0 - 15.0 g/dL 11.0(L) 10.2(L) 9.5(L)  Hematocrit 36 - 46 % 31.6(L) 30.2(L) 28.5(L)  Platelets 150 - 400 K/uL 549(H) 421(H) 332   BMP Latest Ref Rng & Units 08/13/2019 08/12/2019 08/11/2019  Glucose 70 - 99 mg/dL 120(H) 115(H) 127(H)  BUN 6 - 20 mg/dL 7 6 5(L)  Creatinine 0.44 - 1.00 mg/dL 0.44 0.38(L) 0.47  Sodium 135 - 145 mmol/L 133(L) 135 134(L)  Potassium 3.5 - 5.1 mmol/L 3.3(L) 3.4(L) 3.3(L)  Chloride 98 - 111 mmol/L 96(L) 99 99  CO2 22 - 32 mmol/L 26 24 25   Calcium 8.9 - 10.3 mg/dL 8.4(L) 8.3(L) 8.3(L)    Intake/Output Summary (Last 24 hours) at 08/13/2019 1025 Last data filed at 08/13/2019 0600 Gross per 24 hour  Intake 100 ml  Output 300 ml  Net -200 ml    Current Facility-Administered Medications:    acetaminophen (TYLENOL) tablet 650 mg, 650 mg, Oral, Q6H PRN, 650 mg at 08/10/19 0124 **OR** acetaminophen (TYLENOL) suppository 650 mg, 650 mg, Rectal, Q6H PRN, Adefeso, Oladapo, DO   bisacodyl (DULCOLAX) suppository 10 mg, 10 mg, Rectal, Daily PRN, Starla Link, Kshitiz, MD   ceFAZolin (ANCEF) IVPB 2g/100 mL premix, 2 g, Intravenous, Q8H, Tommy Medal, Lavell Islam, MD, Last Rate: 200 mL/hr at 08/13/19 0657, 2 g at 08/13/19 1751   Chlorhexidine Gluconate Cloth 2 % PADS 6 each, 6 each, Topical, Daily,  [name], MD, 6 each at 08/13/19 0902   [EXPIRED] cloNIDine (CATAPRES) tablet 0.1 mg, 0.1 mg, Oral, QID, 0.1 mg at 08/10/19 2123 **FOLLOWED BY** [COMPLETED] cloNIDine (CATAPRES) tablet 0.1 mg, 0.1 mg, Oral, BH-qamhs, 0.1 mg at 08/12/19 2226 **FOLLOWED BY** cloNIDine (CATAPRES) tablet 0.1 mg, 0.1 mg, Oral, QAC breakfast, Alekh, Kshitiz, MD, 0.1 mg at 08/13/19 0757   dicyclomine (BENTYL) tablet 20 mg, 20 mg, Oral, Q6H PRN, Starla Link, Kshitiz, MD   enoxaparin (LOVENOX) injection 40 mg, 40 mg, Subcutaneous, Q24H, Adefeso, Oladapo, DO, 40 mg at 02/58/52 7782   folic acid (FOLVITE) tablet 1 mg, 1 mg, Oral, Daily, Rathore, Vasundhra, MD, 1 mg at 08/13/19 0825   HYDROmorphone (DILAUDID) injection 1-2 mg, 1-2 mg, Intravenous, Q2H PRN, Starla Link, Kshitiz, MD, 2 mg at 08/13/19 0413   hydrOXYzine (ATARAX/VISTARIL) tablet 25 mg, 25 mg, Oral, Q6H PRN, Starla Link, Kshitiz, MD, 25 mg at 08/11/19 2122   loperamide (IMODIUM) capsule 2-4 mg, 2-4 mg, Oral, PRN, Starla Link, Kshitiz, MD   LORazepam (ATIVAN) tablet 1-4 mg, 1-4 mg, Oral, Q1H PRN, 2 mg at 08/13/19 0802 **OR** LORazepam (ATIVAN) injection 1-4 mg, 1-4 mg, Intravenous, Q1H PRN, Shela Leff, MD, 2 mg at 08/13/19 0345   methocarbamol (ROBAXIN) tablet 750 mg, 750 mg, Oral, Q6H PRN, Aline August, MD,  750 mg at 08/12/19 1721   multivitamin with minerals tablet 1 tablet, 1 tablet, Oral, Daily, Shela Leff, MD, 1 tablet at 08/13/19 0757   naproxen (NAPROSYN) tablet 500 mg, 500 mg, Oral, BID PRN, Aline August, MD, 500 mg at 08/12/19 0026   nicotine (NICODERM CQ - dosed in mg/24 hours) patch 14 mg, 14 mg, Transdermal, Daily, Blount, Xenia T, NP, 14 mg at 08/13/19 0803   ondansetron (ZOFRAN-ODT) disintegrating tablet 4 mg, 4 mg, Oral, Q6H PRN, Starla Link, Kshitiz, MD   oxyCODONE (Oxy IR/ROXICODONE) immediate release tablet 5-10 mg, 5-10 mg, Oral, Q4H PRN, Starla Link, Kshitiz, MD, 10 mg at 08/12/19 1722   polyethylene glycol (MIRALAX / GLYCOLAX) packet 17 g, 17 g, Oral,  Daily PRN, Alekh, Kshitiz, MD   potassium chloride 10 mEq in 100 mL IVPB, 10 mEq, Intravenous, Q1 Hr x 4, Amin, Ankit Chirag, MD   rifampin (RIFADIN) capsule 300 mg, 300 mg, Oral, BID WC, Golden Circle, FNP, 300 mg at 08/13/19 0388   senna-docusate (Senokot-S) tablet 1 tablet, 1 tablet, Oral, BID, Starla Link, Kshitiz, MD, 1 tablet at 08/13/19 8280   thiamine tablet 100 mg, 100 mg, Oral, Daily, 100 mg at 08/13/19 0757 **OR** [DISCONTINUED] thiamine (B-1) injection 100 mg, 100 mg, Intravenous, Daily, Shela Leff, MD   Physical Exam: Strength diffusely 4/5, diffusely decreased sensation x4, +R hoffman's, reflexes 2+ with no ankle clonus incision clean / dry / intact, no drainage able to be expressed on my exam  Assessment & Plan: 44 y.o. woman s/p C3-7 lami C3-6 PSIF, recovering well.  -activity as tolerated, no collar needed -exam improving, lower extremity reflexes / clonus resolved -likely mobilizing seroma / hematoma that leaked today, reassured pt this is a common / benign post-operative occurence  [name]  08/13/19 10:25 AM

## 2019-08-13 NOTE — Progress Notes (Signed)
Patient voided in bed. Bath given with peri-care. Lotion applied to entire body and esp bony areas. Linen changed. Tried to [employer]. Fall socks placed on and SCD stockings started back.

## 2019-08-13 NOTE — TOC CAGE-AID Note (Signed)
Transition of Care [facility]) - CAGE-AID Screening   Patient Details  Name: Monica Eaton MRN: [id number] Date of Birth: September 20, 1975  [facility]) CM/SW Contact:    Emeterio Reeve, [place] Phone Number: 08/13/2019, 4:28 PM   Clinical Narrative:  CSW met with pt at bedside. CSW introduced self and explained her role at the hospital. Pt stated she didn't want to talk. Pt was receptive to CSW leaving substance abuse resources.   CAGE-AID Screening: Substance Abuse Screening unable to be completed due to: : Patient Refused               Emeterio Reeve, [place], [place] Social Worker [phone]

## 2019-08-13 NOTE — Progress Notes (Signed)
Heard patient yelling out and walked in the room to see patient sitting on the floor next to bedside commode. Brooke, Therapist, sports, was assisting her to commode and stated that she had slid off the commode and onto the floor. When we helped patient stand up, continuous drainage was noted coming out of her incision. ABD pad placed and taped on the back of her neck. MD notified and aware. Will continue to monitor

## 2019-08-14 ENCOUNTER — Encounter (HOSPITAL_COMMUNITY): Payer: Self-pay | Admitting: Internal Medicine

## 2019-08-14 LAB — CBC
HCT: 28.4 % — ABNORMAL LOW (ref 36.0–46.0)
Hemoglobin: 9.7 g/dL — ABNORMAL LOW (ref 12.0–15.0)
MCH: 30.5 pg (ref 26.0–34.0)
MCHC: 34.2 g/dL (ref 30.0–36.0)
MCV: 89.3 fL (ref 80.0–100.0)
Platelets: 533 10*3/uL — ABNORMAL HIGH (ref 150–400)
RBC: 3.18 MIL/uL — ABNORMAL LOW (ref 3.87–5.11)
RDW: 12.2 % (ref 11.5–15.5)
WBC: 15.6 10*3/uL — ABNORMAL HIGH (ref 4.0–10.5)
nRBC: 0 % (ref 0.0–0.2)

## 2019-08-14 LAB — BASIC METABOLIC PANEL
Anion gap: 9 (ref 5–15)
BUN: 6 mg/dL (ref 6–20)
CO2: 28 mmol/L (ref 22–32)
Calcium: 8.2 mg/dL — ABNORMAL LOW (ref 8.9–10.3)
Chloride: 96 mmol/L — ABNORMAL LOW (ref 98–111)
Creatinine, Ser: 0.46 mg/dL (ref 0.44–1.00)
GFR calc Af Amer: >60 mL/min (ref >60)
GFR calc non Af Amer: >60 mL/min (ref >60)
Glucose, Bld: 127 mg/dL — ABNORMAL HIGH (ref 70–99)
Potassium: 3.5 mmol/L (ref 3.5–5.1)
Sodium: 133 mmol/L — ABNORMAL LOW (ref 135–145)

## 2019-08-14 LAB — MAGNESIUM: Magnesium: 2.1 mg/dL (ref 1.7–2.4)

## 2019-08-14 MED ORDER — TRAZODONE HCL 100 MG PO TABS
100.0000 mg | ORAL_TABLET | Freq: Every evening | ORAL | Status: DC | PRN
  Administered 2019-08-14 – 2019-09-07 (×19): 100 mg via ORAL
  Filled 2019-08-14 (×19): qty 1

## 2019-08-14 MED ORDER — AMLODIPINE BESYLATE 5 MG PO TABS
5.0000 mg | ORAL_TABLET | Freq: Every day | ORAL | Status: DC
  Administered 2019-08-14 – 2019-08-24 (×11): 5 mg via ORAL
  Filled 2019-08-14 (×11): qty 1

## 2019-08-14 MED ORDER — POTASSIUM CHLORIDE CRYS ER 20 MEQ PO TBCR
40.0000 meq | EXTENDED_RELEASE_TABLET | Freq: Once | ORAL | Status: AC
  Administered 2019-08-14: 40 meq via ORAL
  Filled 2019-08-14: qty 2

## 2019-08-14 NOTE — Plan of Care (Signed)
  Problem: Education: Goal: Knowledge of General Education information will improve Description Including pain rating scale, medication(s)/side effects and non-pharmacologic comfort measures Outcome: Progressing   

## 2019-08-14 NOTE — Evaluation (Signed)
Occupational Therapy Evaluation Patient Details Name: Monica Eaton MRN: [id number] DOB: 1975-07-15 Today's Date: 08/14/2019    History of Present Illness Monica Eaton is a 44 y.o. female with medical history significant for tobacco abuse and IV drug abuse who presents to the emergency department due to sudden onset of upper back pain which started about 3 -4 hours PTA.  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 Pt underwent, s/p C3-6 OLaminectomy & fusion.    Clinical Impression   Pt admitted with the above diagnoses and presents with below problem list. Pt will benefit from continued acute OT to address the below listed deficits and maximize independence with basic ADLs prior to d/c to next venue. PTA pt was independent with ADLs. Pt currently mod A with most ADLs in seated position. Sat EOB about 5 minutes initially with min A but improving to min guard level, BUE support needed. Transfers/mobility assessment deferred as pt needing to return to bed 2/2 dizziness/lightheadedness. Pt endorses altered sensation in BUE and decreased sensation in B feet.      Follow Up Recommendations  CIR    Equipment Recommendations  Other (comment) (TBD)    Recommendations for Other Services Rehab consult     Precautions / Restrictions Precautions Precautions: Cervical Precaution Booklet Issued: Yes (comment) Required Braces or Orthoses:  (per neurosurgery, no collar/brace indicated) Restrictions Weight Bearing Restrictions: No      Mobility Bed Mobility Overal bed mobility: Needs Assistance Bed Mobility: Rolling;Sidelying to Sit;Sit to Sidelying Rolling: Min assist Sidelying to sit: Mod assist;+2 for safety/equipment     Sit to sidelying: Mod assist;+2 for safety/equipment General bed mobility comments: Pt very focused on fear of neck movements and increased pain. asked to have someone "stabilize my head" coming to EOB  position. Assist to [place] trunk   Transfers                 General transfer comment: unable to attempt.     Balance Overall balance assessment: Needs assistance Sitting-balance support: Feet supported;Bilateral upper extremity supported Sitting balance-Leahy Scale: Poor Sitting balance - Comments: min A improving to min guard. Stayed very close to pt both for safety and reassurance. Sat with BUE support for 5 minutes before needing to return to bed due to dizziness/lightheadedness.                                    ADL either performed or assessed with clinical judgement   ADL Overall ADL's : Needs assistance/impaired Eating/Feeding: Set up;Bed level   Grooming: Sitting;Maximal assistance Grooming Details (indicate cue type and reason): needs BUE support sitting EOB Upper Body Bathing: Maximal assistance;Sitting Upper Body Bathing Details (indicate cue type and reason): needs BUE support sitting EOB Lower Body Bathing: Moderate assistance Lower Body Bathing Details (indicate cue type and reason): very guarded with movements but physically able to roll to each side with min guard to min A. and access legs in supine Upper Body Dressing : Moderate assistance   Lower Body Dressing: Moderate assistance;Bed level                 General ADL Comments: Pt completed bed mobility and sat EOB with BUE support and min to min guard assist for 5 minutes. Pt fearful of falling and injurying neck responded well to extra time and encouragement. Endorsed dizziness and feeling like something was "off" after  5 minutes. Pt assisted back to bed. No transfers attempted this session     Vision         Perception     Praxis      Pertinent Vitals/Pain Pain Assessment: 0-10 Pain Score: 7  Pain Location: neck with activity, 6/10 at rest Pain Descriptors / Indicators: Guarding;Crying Pain Intervention(s): Limited activity within patient's tolerance;Monitored during  session;Premedicated before session;Patient requesting pain meds-RN notified     Hand Dominance     Extremity/Trunk Assessment Upper Extremity Assessment Upper Extremity Assessment: RUE deficits/detail;LUE deficits/detail RUE Deficits / Details: WNL AROM, generalized weakness. endorses altered sensation throughout arms.  LUE Deficits / Details: WNL AROM, generalized weakness. endorses altered sensation throughout arms   Lower Extremity Assessment Lower Extremity Assessment: Defer to PT evaluation   Cervical / Trunk Assessment Cervical / Trunk Assessment: Other exceptions Cervical / Trunk Exceptions: neck surgery   Communication Communication Communication: No difficulties   Cognition Arousal/Alertness: Awake/alert Behavior During Therapy: Anxious;Impulsive Overall Cognitive Status: Within Functional Limits for tasks assessed                                 General Comments: Pt very anxious and guarded with movements. Very fearful of damaging incision and/or falling "like I did yesterday" Pt unable to recall events of fall yesterday but does remember nurse was present. Pt internally distracted and verbalized feeling overwhelmed by current medical condition, past diffiuclties with family and drug abuse relapses. She verbalized desire to no longer use drugs and to have a better relationship with her parents. Provided therapeutic use of self and active listening space for pt to try to prcoess her emotions/thoughts.    General Comments  pt with lots of track marks from IV use    Exercises     Shoulder Instructions      Home Living Family/patient expects to be discharged to:: Unsure                                 Additional Comments: Pt endorses that PTA she was homeless and sleeping in her car. She has been in contact with her parents in the past few days and plans to d/c to their residence. "they will have a 1 story home with no stairs, they're moving  in 2-25months"       Prior Functioning/Environment Level of Independence: Independent        Comments: pt has 2 children ages 36 and 6 who she reports are staying with her ex.         OT Problem List: Decreased strength;Decreased activity tolerance;Impaired balance (sitting and/or standing);Decreased knowledge of use of DME or AE;Decreased knowledge of precautions;Impaired sensation;Impaired UE functional use;Pain      OT Treatment/Interventions: Self-care/ADL training;Therapeutic exercise;Neuromuscular education;DME and/or AE instruction;Energy conservation;Therapeutic activities;Patient/family education;Balance training    OT Goals(Current goals can be found in the care plan section) Acute Rehab OT Goals Patient Stated Goal: to walk again. spoke about personal goals of not relapsing and improving relationship with parents.  OT Goal Formulation: With patient Time For Goal Achievement: 08/28/19 Potential to Achieve Goals: Good  OT Frequency: Min 2X/week   Barriers to D/C:    currently homeless and h/o IV drug use. Pt plans to d/c to parent's house and verbalized strong desire to not relapse.        Co-evaluation PT/OT/SLP Co-Evaluation/Treatment: Yes  Reason for Co-Treatment: Complexity of the patient's impairments (multi-system involvement);For patient/therapist safety;To address functional/ADL transfers   OT goals addressed during session: ADL's and self-care      AM-PAC OT "6 Clicks" Daily Activity     Outcome Measure Help from another person eating meals?: A Little Help from another person taking care of personal grooming?: A Lot Help from another person toileting, which includes using toliet, bedpan, or urinal?: A Lot Help from another person bathing (including washing, rinsing, drying)?: A Lot Help from another person to put on and taking off regular upper body clothing?: A Lot Help from another person to put on and taking off regular lower body clothing?: A Lot 6  Click Score: 13   End of Session    Activity Tolerance: Patient limited by pain;Patient limited by fatigue;Other (comment) (dizzy/lightheaded "something doesn't feel right") Patient left: in bed;with call bell/phone within reach;with nursing/sitter in room  OT Visit Diagnosis: Unsteadiness on feet (R26.81);Pain;Muscle weakness (generalized) (M62.81);Other abnormalities of gait and mobility (R26.89);Other symptoms and signs involving the nervous system (R29.898)                Time: 3967-2897 OT Time Calculation (min): 37 min Charges:  OT General Charges $OT Visit: 1 Visit OT Evaluation $OT Eval Moderate Complexity: [place], OT Acute Rehabilitation Services Pager: [phone] Office: [phone]   Hortencia Pilar 08/14/2019, 11:36 AM

## 2019-08-14 NOTE — Progress Notes (Signed)
Neurosurgery Service Progress Note  Subjective: No acute events overnight, no further wound drainage, symptoms stable  Objective: Vitals:   08/13/19 2319 08/14/19 0330 08/14/19 0738 08/14/19 0753  BP: (!) 150/77 (!) 143/78  (!) 163/91  Pulse: 90 84  78  Resp: 19 18  18   Temp: 99.4 F (37.4 C) 99.3 F (37.4 C)  99 F (37.2 C)  TempSrc: Oral Oral  Oral  SpO2: 100% 100% 100% 100%  Weight:      Height:       Temp (24hrs), Avg:98.8 F (37.1 C), Min:98.1 F (36.7 C), Max:99.4 F (37.4 C)  CBC Latest Ref Rng & Units 08/14/2019 08/13/2019 08/12/2019  WBC 4.0 - 10.5 K/uL 15.6(H) 17.0(H) 15.0(H)  Hemoglobin 12.0 - 15.0 g/dL 9.7(L) 11.0(L) 10.2(L)  Hematocrit 36 - 46 % 28.4(L) 31.6(L) 30.2(L)  Platelets 150 - 400 K/uL 533(H) 549(H) 421(H)   BMP Latest Ref Rng & Units 08/14/2019 08/13/2019 08/12/2019  Glucose 70 - 99 mg/dL 127(H) 120(H) 115(H)  BUN 6 - 20 mg/dL 6 7 6   Creatinine 0.44 - 1.00 mg/dL 0.46 0.44 0.38(L)  Sodium 135 - 145 mmol/L 133(L) 133(L) 135  Potassium 3.5 - 5.1 mmol/L 3.5 3.3(L) 3.4(L)  Chloride 98 - 111 mmol/L 96(L) 96(L) 99  CO2 22 - 32 mmol/L 28 26 24   Calcium 8.9 - 10.3 mg/dL 8.2(L) 8.4(L) 8.3(L)    Intake/Output Summary (Last 24 hours) at 08/14/2019 0852 Last data filed at 08/14/2019 0330 Gross per 24 hour  Intake 10 ml  Output 625 ml  Net -615 ml    Current Facility-Administered Medications:  .  acetaminophen (TYLENOL) tablet 650 mg, 650 mg, Oral, Q6H PRN, 650 mg at 08/10/19 0124 **OR** acetaminophen (TYLENOL) suppository 650 mg, 650 mg, Rectal, Q6H PRN, Adefeso, Oladapo, DO .  amLODipine (NORVASC) tablet 5 mg, 5 mg, Oral, Daily, Amin, Ankit Chirag, MD .  bisacodyl (DULCOLAX) suppository 10 mg, 10 mg, Rectal, Daily PRN, Alekh, Kshitiz, MD .  ceFAZolin (ANCEF) IVPB 2g/100 mL premix, 2 g, Intravenous, Q8H, Tommy Medal, Lavell Islam, MD, Last Rate: 200 mL/hr at 08/14/19 0558, 2 g at 08/14/19 0558 .  Chlorhexidine Gluconate Cloth 2 % PADS 6 each, 6 each, Topical, Daily,  [name], MD, 6 each at 08/14/19 0815 .  enoxaparin (LOVENOX) injection 40 mg, 40 mg, Subcutaneous, Q24H, Adefeso, Oladapo, DO, 40 mg at 08/13/19 1720 .  folic acid (FOLVITE) tablet 1 mg, 1 mg, Oral, Daily, Shela Leff, MD, 1 mg at 08/14/19 0819 .  HYDROmorphone (DILAUDID) injection 1-2 mg, 1-2 mg, Intravenous, Q2H PRN, Starla Link, Kshitiz, MD, 2 mg at 08/14/19 0816 .  methocarbamol (ROBAXIN) tablet 750 mg, 750 mg, Oral, Q6H PRN, Starla Link, Kshitiz, MD, 750 mg at 08/14/19 0645 .  multivitamin with minerals tablet 1 tablet, 1 tablet, Oral, Daily, Shela Leff, MD, 1 tablet at 08/14/19 0818 .  nicotine (NICODERM CQ - dosed in mg/24 hours) patch 14 mg, 14 mg, Transdermal, Daily, Blount, Xenia T, NP, 14 mg at 08/14/19 0819 .  oxyCODONE (Oxy IR/ROXICODONE) immediate release tablet 5-10 mg, 5-10 mg, Oral, Q4H PRN, Starla Link, Kshitiz, MD, 10 mg at 08/14/19 0253 .  polyethylene glycol (MIRALAX / GLYCOLAX) packet 17 g, 17 g, Oral, Daily PRN, Alekh, Kshitiz, MD .  potassium chloride SA (KLOR-CON) CR tablet 40 mEq, 40 mEq, Oral, Once, Amin, Ankit Chirag, MD .  rifampin (RIFADIN) capsule 300 mg, 300 mg, Oral, BID WC, Golden Circle, FNP, 300 mg at 08/14/19 0818 .  senna-docusate (Senokot-S) tablet 1 tablet,  1 tablet, Oral, BID, Aline August, MD, 1 tablet at 08/14/19 0818 .  sodium chloride flush (NS) 0.9 % injection 10-40 mL, 10-40 mL, Intracatheter, Q12H, Amin, Ankit Chirag, MD, 10 mL at 08/14/19 0820 .  sodium chloride flush (NS) 0.9 % injection 10-40 mL, 10-40 mL, Intracatheter, PRN, Amin, Ankit Chirag, MD .  thiamine tablet 100 mg, 100 mg, Oral, Daily, 100 mg at 08/14/19 0818 **OR** [DISCONTINUED] thiamine (B-1) injection 100 mg, 100 mg, Intravenous, Daily, Shela Leff, MD   Physical Exam: Strength diffusely 4/5, diffusely decreased sensation x4, +R hoffman's, reflexes 2+ with ankle clonus incision clean / dry / intact  Assessment & Plan: 44 y.o. woman s/p C3-7 lami C3-6 PSIF,  recovering well.  -activity as tolerated, no collar needed  [name]  08/14/19 8:52 AM

## 2019-08-14 NOTE — Anesthesia Postprocedure Evaluation (Signed)
Anesthesia Post Note  Patient: Monica Eaton  Procedure(s) Performed: MRI WITH ANESTHESIA (N/A )     Patient location during evaluation: PACU Anesthesia Type: General Level of consciousness: awake and alert Pain management: pain level controlled Vital Signs Assessment: post-procedure vital signs reviewed and stable Respiratory status: spontaneous breathing, nonlabored ventilation, respiratory function stable and patient connected to nasal cannula oxygen Cardiovascular status: blood pressure returned to baseline and stable Postop Assessment: no apparent nausea or vomiting Anesthetic complications: no   No complications documented.  Last Vitals:  Vitals:   08/14/19 0738 08/14/19 0753  BP:  (!) 163/91  Pulse:  78  Resp:  18  Temp:  37.2 C  SpO2: 100% 100%    Last Pain:  Vitals:   08/14/19 0753  TempSrc: Oral  PainSc:                  Ferrah Panagopoulos

## 2019-08-14 NOTE — Progress Notes (Signed)
[facility]ious [facility]   Reason for visit: Follow up on cervical spinal infection s/p debridement  Interval History: rifampin added and no concerns.  No associated rash or diarrhea.  Fell last night. No fever. WBC 15.6.  Complaint of pain.     Physical Exam: Constitutional:  Vitals:   08/14/19 0738 08/14/19 0753  BP:  (!) 163/91  Pulse:  78  Resp:  18  Temp:  99 F (37.2 C)  SpO2: 100% 100%   patient appears in NAD Respiratory: Normal respiratory effort; CTA B Cardiovascular: RRR GI: soft, nt, nd  Review of Systems: Constitutional: negative for fevers and chills Gastrointestinal: negative for nausea and diarrhea Integument/breast: negative for rash  Lab Results  Component Value Date   WBC 15.6 (H) 08/14/2019   HGB 9.7 (L) 08/14/2019   HCT 28.4 (L) 08/14/2019   MCV 89.3 08/14/2019   PLT 533 (H) 08/14/2019    Lab Results  Component Value Date   CREATININE 0.46 08/14/2019   BUN 6 08/14/2019   NA 133 (L) 08/14/2019   K 3.5 08/14/2019   CL 96 (L) 08/14/2019   CO2 28 08/14/2019    Lab Results  Component Value Date   ALT 16 08/12/2019   AST 15 08/12/2019   ALKPHOS 82 08/12/2019     Microbiology: Recent Results (from the past 240 hour(s))  Culture, blood (Routine X 2) w Reflex to ID Panel     Status: Abnormal   Collection Time: 08/08/19  7:27 AM   Specimen: BLOOD RIGHT FOREARM  Result Value Ref Range Status   Specimen Description BLOOD RIGHT FOREARM  Final   Special Requests   Final    BOTTLES DRAWN AEROBIC AND ANAEROBIC Blood Culture results may not be optimal due to an inadequate volume of blood received in culture bottles   Culture  Setup Time   Final    IN BOTH AEROBIC AND ANAEROBIC BOTTLES GRAM POSITIVE COCCI IN CLUSTERS CRITICAL RESULT CALLED TO, READ BACK BY AND VERIFIED WITH: Karsten Ro [facility] 08/09/19 0102 JDW Performed at [facility], [address] [place], [place] [address]    Culture STAPHYLOCOCCUS AUREUS (A)  Final    Report Status 08/10/2019 FINAL  Final   Organism ID, Bacteria STAPHYLOCOCCUS AUREUS  Final      Susceptibility   Staphylococcus aureus - MIC*    CIPROFLOXACIN <=0.5 SENSITIVE Sensitive     ERYTHROMYCIN >=8 RESISTANT Resistant     GENTAMICIN <=0.5 SENSITIVE Sensitive     OXACILLIN 0.5 SENSITIVE Sensitive     TETRACYCLINE <=1 SENSITIVE Sensitive     VANCOMYCIN <=0.5 SENSITIVE Sensitive     TRIMETH/SULFA <=10 SENSITIVE Sensitive     CLINDAMYCIN <=0.25 SENSITIVE Sensitive     RIFAMPIN <=0.5 SENSITIVE Sensitive     Inducible Clindamycin NEGATIVE Sensitive     * STAPHYLOCOCCUS AUREUS  Blood Culture ID Panel (Reflexed)     Status: Abnormal   Collection Time: 08/08/19  7:27 AM  Result Value Ref Range Status   Enterococcus species NOT DETECTED NOT DETECTED Final   Listeria monocytogenes NOT DETECTED NOT DETECTED Final   Staphylococcus species DETECTED (A) NOT DETECTED Final    Comment: CRITICAL RESULT CALLED TO, READ BACK BY AND VERIFIED WITH: J LEDFORD PHARMD 08/09/19 0102 JDW    Staphylococcus aureus (BCID) DETECTED (A) NOT DETECTED Final    Comment: Methicillin (oxacillin) susceptible Staphylococcus aureus (MSSA). Preferred therapy is anti staphylococcal beta lactam antibiotic (Cefazolin or Nafcillin), unless clinically contraindicated.  CRITICAL RESULT CALLED TO, READ BACK BY AND VERIFIED WITH: J LEDFORD [facility] 08/09/19 0102 JDW    Methicillin resistance NOT DETECTED NOT DETECTED Final   Streptococcus species NOT DETECTED NOT DETECTED Final   Streptococcus agalactiae NOT DETECTED NOT DETECTED Final   Streptococcus pneumoniae NOT DETECTED NOT DETECTED Final   Streptococcus pyogenes NOT DETECTED NOT DETECTED Final   Acinetobacter baumannii NOT DETECTED NOT DETECTED Final   Enterobacteriaceae species NOT DETECTED NOT DETECTED Final   Enterobacter cloacae complex NOT DETECTED NOT DETECTED Final   Escherichia coli NOT DETECTED NOT DETECTED Final   Klebsiella oxytoca NOT DETECTED NOT DETECTED  Final   Klebsiella pneumoniae NOT DETECTED NOT DETECTED Final   Proteus species NOT DETECTED NOT DETECTED Final   Serratia marcescens NOT DETECTED NOT DETECTED Final   Haemophilus influenzae NOT DETECTED NOT DETECTED Final   Neisseria meningitidis NOT DETECTED NOT DETECTED Final   Pseudomonas aeruginosa NOT DETECTED NOT DETECTED Final   Candida albicans NOT DETECTED NOT DETECTED Final   Candida glabrata NOT DETECTED NOT DETECTED Final   Candida krusei NOT DETECTED NOT DETECTED Final   Candida parapsilosis NOT DETECTED NOT DETECTED Final   Candida tropicalis NOT DETECTED NOT DETECTED Final    Comment: Performed at [facility], [place]. [address] [place], [place] [address]  SARS Coronavirus 2 by RT PCR (hospital order, performed in [facility] hospital lab) Nasopharyngeal Nasopharyngeal Swab     Status: None   Collection Time: 08/08/19  9:02 AM   Specimen: Nasopharyngeal Swab  Result Value Ref Range Status   SARS Coronavirus 2 NEGATIVE NEGATIVE Final    Comment: (NOTE) SARS-CoV-2 target nucleic acids are NOT DETECTED.  The SARS-CoV-2 RNA is generally detectable in upper and lower respiratory specimens during the acute phase of infection. The lowest concentration of SARS-CoV-2 viral copies this assay can detect is 250 copies / mL. A negative result does not preclude SARS-CoV-2 infection and should not be used as the sole basis for treatment or other patient management decisions.  A negative result may occur with improper specimen collection / handling, submission of specimen other than nasopharyngeal swab, presence of viral mutation(s) within the areas targeted by this assay, and inadequate number of viral copies (<250 copies / mL). A negative result must be combined with clinical observations, patient history, and epidemiological information.  Fact Sheet for Patients:   StrictlyIdeas.no  Fact Sheet for Healthcare  Providers: BankingDealers.co.za  This test is not yet approved or  cleared by the [place] FDA and has been authorized for detection and/or diagnosis of SARS-CoV-2 by FDA under an Emergency Use Authorization (EUA).  This EUA will remain in effect (meaning this test can be used) for the duration of the COVID-19 declaration under Section 564(b)(1) of the Act, 21 U.S.C. section 360bbb-3(b)(1), unless the authorization is terminated or revoked sooner.  Performed at [facility], [place] [address], [place], [place] [address]   Culture, blood (Routine X 2) w Reflex to ID Panel     Status: Abnormal   Collection Time: 08/08/19  5:11 PM   Specimen: BLOOD RIGHT HAND  Result Value Ref Range Status   Specimen Description BLOOD RIGHT HAND  Final   Special Requests   Final    BOTTLES DRAWN AEROBIC AND ANAEROBIC Blood Culture adequate volume   Culture  Setup Time   Final    GRAM POSITIVE COCCI IN CLUSTERS IN BOTH AEROBIC AND ANAEROBIC BOTTLES CRITICAL RESULT CALLED TO, READ BACK BY AND VERIFIED  WITH: J. LEDFORD,PHARMD 0503 08/09/2019 T. TYSOR    Culture (A)  Final    STAPHYLOCOCCUS AUREUS SUSCEPTIBILITIES PERFORMED ON PREVIOUS CULTURE WITHIN THE LAST 5 DAYS. Performed at [facility], [place] [address] [place], [place] [address]    Report Status 08/11/2019 FINAL  Final  Culture, blood (routine x 2)     Status: None (Preliminary result)   Collection Time: 08/10/19  7:04 AM   Specimen: BLOOD  Result Value Ref Range Status   Specimen Description BLOOD RIGHT ANTECUBITAL  Final   Special Requests   Final    BOTTLES DRAWN AEROBIC ONLY Blood Culture results may not be optimal due to an inadequate volume of blood received in culture bottles   Culture   Final    NO GROWTH 3 DAYS Performed at [facility], [place] [address], [place], [place] [address]    Report Status PENDING  Incomplete  Culture, blood (routine x 2)     Status: None (Preliminary result)    Collection Time: 08/10/19  7:04 AM   Specimen: BLOOD RIGHT HAND  Result Value Ref Range Status   Specimen Description BLOOD RIGHT HAND  Final   Special Requests   Final    BOTTLES DRAWN AEROBIC ONLY Blood Culture adequate volume   Culture   Final    NO GROWTH 3 DAYS Performed at [facility], [place] [address], [place], [place] [address]    Report Status PENDING  Incomplete    Impression/Plan:  1. 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 Zada Finders on 7/2.  Recovering well.  She remains on cefazolin with rifampin added.  Will need a prolonged course.  Will consider appropriate discharge after 3-4 weeks with antibiotics continued at home in some capacity if doing well neurologically.   No absolute indication for TEE with prolonged treatment as above.  2. Pain control - per primary team  Will follow up intermittently

## 2019-08-14 NOTE — Progress Notes (Signed)
Physical Therapy Treatment Patient Details Name: Monica Eaton MRN: [id number] DOB: 1975-09-21 Today's Date: 08/14/2019    History of Present Illness Monica Eaton is a 44 y.o. female with medical history significant for tobacco abuse and IV drug abuse who presents to the emergency department due to sudden onset of upper back pain which started about 3 -4 hours PTA.  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 Pt underwent, s/p C3-6 OLaminectomy & fusion.     PT Comments    Per chart and pt, pt fell off bedside commode yesterday. Pt very fearful of getting out of bed today but was agreeable to sitting edge of bed. Pt sat edge of bed x 5 minutes, tolerance limited by dizziness and neck pain. Educated pt on benefits of mobility and potential complications from bedrest. Pt put forth good effort. Consider CIR.    Follow Up Recommendations  SNF;Supervision/Assistance - 24 hour;CIR     Equipment Recommendations  Rolling walker with 5" wheels    Recommendations for Other Services       Precautions / Restrictions Precautions Precautions: Cervical Precaution Booklet Issued: Yes (comment) Required Braces or Orthoses:  (per neurosurgery, no collar/brace indicated) Restrictions Weight Bearing Restrictions: No    Mobility  Bed Mobility Overal bed mobility: Needs Assistance Bed Mobility: Rolling;Sidelying to Sit;Sit to Sidelying Rolling: Min assist Sidelying to sit: Mod assist;+2 for safety/equipment     Sit to sidelying: Mod assist;+2 for safety/equipment General bed mobility comments: Pt very focused on fear of neck movements and increased pain. asked to have someone "stabilize my head" coming to EOB position. Assist to poswerup trunk. Pt sat EOB x 5 minutes  Transfers                 General transfer comment: pt declined attempt 2* pain, dizziness, fear of falling  Ambulation/Gait                  Stairs             Wheelchair Mobility    Modified Rankin (Stroke Patients Only)       Balance Overall balance assessment: Needs assistance Sitting-balance support: Feet supported;Bilateral upper extremity supported Sitting balance-Leahy Scale: Poor Sitting balance - Comments: min A improving to min guard. Stayed very close to pt both for safety and reassurance. Sat with BUE support for 5 minutes before needing to return to bed due to dizziness/lightheadedness.                                     Cognition Arousal/Alertness: Awake/alert Behavior During Therapy: Anxious;Impulsive Overall Cognitive Status: Within Functional Limits for tasks assessed                                 General Comments: Pt very anxious and guarded with movements. Very fearful of damaging incision and/or falling "like I did yesterday" Pt unable to recall events of fall yesterday but does remember nurse was present. Pt internally distracted and verbalized feeling overwhelmed by current medical condition, past diffiuclties with family and drug abuse relapses. She verbalized desire to no longer use drugs and to have a better relationship with her parents. Provided therapeutic use of self and active listening space for pt to try to prcoess her emotions/thoughts.       Exercises General  Exercises - Lower Extremity Ankle Circles/Pumps: AROM;Both;10 reps;Supine    General Comments General comments (skin integrity, edema, etc.): pt with lots of track marks from IV use      Pertinent Vitals/Pain Pain Assessment: 0-10 Pain Score: 7  Pain Location: neck with activity, 6/10 at rest Pain Descriptors / Indicators: Guarding;Crying Pain Intervention(s): Limited activity within patient's tolerance;Monitored during session;Premedicated before session;Patient requesting pain meds-RN notified    Home Living Family/patient expects to be discharged to:: Unsure                Additional Comments: Pt endorses that PTA she was homeless and sleeping in her car. She has been in contact with her parents in the past few days and plans to d/c to their residence. "they will have a 1 story home with no stairs, they're moving in 2-16months"     Prior Function Level of Independence: Independent      Comments: pt has 2 children ages 60 and 6 who she reports are staying with her ex.    PT Goals (current goals can now be found in the care plan section) Acute Rehab PT Goals Patient Stated Goal: to walk again. spoke about personal goals of not relapsing and improving relationship with parents.  PT Goal Formulation: With patient Time For Goal Achievement: 08/27/19 Potential to Achieve Goals: Fair Progress towards PT goals: Progressing toward goals    Frequency    Min 5X/week      PT Plan Current plan remains appropriate    Co-evaluation   Reason for Co-Treatment: Complexity of the patient's impairments (multi-system involvement);For patient/therapist safety;To address functional/ADL transfers   OT goals addressed during session: ADL's and self-care      AM-PAC PT "6 Clicks" Mobility   Outcome Measure  Help needed turning from your back to your side while in a flat bed without using bedrails?: A Lot Help needed moving from lying on your back to sitting on the side of a flat bed without using bedrails?: A Lot Help needed moving to and from a bed to a chair (including a wheelchair)?: A Lot Help needed standing up from a chair using your arms (e.g., wheelchair or bedside chair)?: A Lot Help needed to walk in hospital room?: Total Help needed climbing 3-5 steps with a railing? : A Lot 6 Click Score: 11    End of Session Equipment Utilized During Treatment: Gait belt Activity Tolerance: Patient limited by pain;Patient limited by lethargy Patient left: in bed;with call bell/phone within reach;with bed alarm set;with nursing/sitter in room Nurse Communication:  Mobility status PT Visit Diagnosis: Unsteadiness on feet (R26.81);Difficulty in walking, not elsewhere classified (R26.2)     Time: 4497-5300 PT Time Calculation (min) (ACUTE ONLY): 38 min  Charges:  $Therapeutic Activity: 23-37 mins                     Blondell Reveal Kistler PT 08/14/2019  Acute Rehabilitation Services Pager [phone] Office [phone]

## 2019-08-14 NOTE — Progress Notes (Signed)
PROGRESS NOTE    Monica Eaton  DSK:[id number] DOB: 1975/07/23 DOA: 08/07/2019 PCP: Patient, No Pcp Per   Brief Narrative:  44 year old female with history of tobacco abuse and IV drug abuse presented with severe neck pain and upper back pain.  In the ED, she had leukocytosis with CRP of 9.4.  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 C2-3 nonsegmentation and 3 mm of anterolisthesis at C3-4 was also noted. An attempt for MRI cervical spine with contrast was unsuccessful due to patient not being able to lay still despite IV Dilaudid, Ativan and Valium.  She was subsequently transferred to [facility] for MRI under general anesthesia.  She was started on broad-spectrum IV antibiotics.  Neurosurgery was consulted.  After MRI of spine under general anesthesia, patient underwent surgical intervention by neurosurgery on 08/08/2019. She was subsequently found to have MSSA bacteremia and ID was consulted.    Assessment & Plan:   Principal Problem:   Cervical pain (neck) Active Problems:   IVDU (intravenous drug user)   Tobacco abuse   Neck pain   Bandemia   MSSA bacteremia   Epidural abscess   Discitis of cervical region   Hardware complicating wound infection ([place])   Severe neck pain secondary to acute cervical discitis/osteomyelitis MSSA bacteremia, POA -MRI cervical spine shows acute cervical discitis/osteomyelitis.  Underwent surgical intervention by neurosurgery -Seen by infectious disease-Cont Ancef and Rifampin.  -Repeat cultures thus far remains negative.  Will remain in the hospital until her antibiotic regimen is completed.  Last day of antibiotics still to be determined by ID.  -Some bleeding around her dressing, advised nursing staff to inform neurosurgery team  Polysubstance abuse Tobacco use -Urine drug screen is  positive for benzodiazepines, opiates and tetrahydrocannabinol.  Counseled to quit using this. Trazodone at night for sleep.    DVT prophylaxis: Lovenox Code Status: Full Family Communication: None at bedside.   Disposition Plan: Status is: Inpatient  Remains inpatient appropriate because:Inpatient level of care appropriate due to severity of illness.  Might need inpatient 4 weeks of IV antibiotics   Dispo: The patient is from: Home  Anticipated d/c is to: Undermined  Anticipated d/c date is: > 3 days  Patient currently is not medically stable to d/c.   Body mass index is 21.13 kg/m.    Subjective: Complaints of insomnia at night.   Review of Systems Otherwise negative except as per HPI, including: General = no fevers, chills, dizziness,  fatigue HEENT/EYES = negative for loss of vision, double vision, blurred vision,  sore throa Cardiovascular= negative for chest pain, palpitation Respiratory/lungs= negative for shortness of breath, cough, wheezing; hemoptysis,  Gastrointestinal= negative for nausea, vomiting, abdominal pain Genitourinary= negative for Dysuria MSK = Negative for arthralgia, myalgias Neurology= Negative for headache, numbness, tingling  Psychiatry= Negative for suicidal and homocidal ideation Skin= Negative for Rash   Examination: Constitutional: Not in acute distress Respiratory: Clear to auscultation bilaterally Cardiovascular: Normal sinus rhythm, no rubs Abdomen: Nontender nondistended good bowel sounds Musculoskeletal: No edema noted Skin: No rashes seen Neurologic: CN 2-12 grossly intact.  And nonfocal Psychiatric: Normal judgment and insight. Alert and oriented x 3. Normal mood.   Objective: Vitals:   08/13/19 2319 08/14/19 0330 08/14/19 0738 08/14/19 0753  BP: (!) 150/77 (!) 143/78  (!) 163/91  Pulse: 90 84  78  Resp: 19 18  18   Temp: 99.4 F (  37.4 C) 99.3 F (37.4 C)  99 F (37.2 C)    TempSrc: Oral Oral  Oral  SpO2: 100% 100% 100% 100%  Weight:      Height:        Intake/Output Summary (Last 24 hours) at 08/14/2019 1141 Last data filed at 08/14/2019 0330 Gross per 24 hour  Intake 10 ml  Output 625 ml  Net -615 ml   Filed Weights   08/07/19 2205 08/08/19 1253 08/08/19 1823  Weight: 59 kg 59 kg 61.2 kg     Data Reviewed:   CBC: Recent Labs  Lab 08/08/19 0007 08/08/19 0727 08/10/19 0704 08/11/19 1000 08/12/19 0418 08/13/19 0418 08/14/19 0452  WBC 19.4*   < > 13.2* 14.0* 15.0* 17.0* 15.6*  NEUTROABS 16.4*  --  10.6* 11.6* 11.7* 14.1*  --   HGB 12.0   < > 9.3* 9.5* 10.2* 11.0* 9.7*  HCT 35.8*   < > 28.8* 28.5* 30.2* 31.6* 28.4*  MCV 94.5   < > 97.6 93.1 91.2 90.0 89.3  PLT 347   < > 265 332 421* 549* 533*   < > = values in this interval not displayed.   Basic Metabolic Panel: Recent Labs  Lab 08/09/19 0336 08/09/19 0336 08/10/19 0704 08/11/19 1000 08/12/19 0418 08/13/19 0418 08/14/19 0452  NA 136   < > 136 134* 135 133* 133*  K 3.8   < > 3.5 3.3* 3.4* 3.3* 3.5  CL 100   < > 104 99 99 96* 96*  CO2 26   < > 23 25 24 26 28   GLUCOSE 141*   < > 162* 127* 115* 120* 127*  BUN 6   < > 7 5* 6 7 6   CREATININE 0.54   < > 0.48 0.47 0.38* 0.44 0.46  CALCIUM 8.8*   < > 8.0* 8.3* 8.3* 8.4* 8.2*  MG 1.8   < > 2.0 1.8 1.8 1.9 2.1  PHOS 3.6  --   --   --   --   --   --    < > = values in this interval not displayed.   GFR: Estimated Creatinine Clearance: 88.5 mL/min (by C-G formula based on SCr of 0.46 mg/dL). Liver Function Tests: Recent Labs  Lab 08/08/19 0007 08/09/19 0336 08/12/19 0418  AST 30 18 15   ALT 22 16 16   ALKPHOS 89 62 82  BILITOT 0.8 0.8 0.7  PROT 8.2* 6.2* 5.6*  ALBUMIN 4.5 2.8* 2.2*   No results for input(s): LIPASE, AMYLASE in the last 168 hours. No results for input(s): AMMONIA in the last 168 hours. Coagulation Profile: Recent Labs  Lab 08/09/19 0336  INR 1.2   Cardiac Enzymes: No results for input(s): CKTOTAL, CKMB,  CKMBINDEX, TROPONINI in the last 168 hours. BNP (last 3 results) No results for input(s): PROBNP in the last 8760 hours. HbA1C: No results for input(s): HGBA1C in the last 72 hours. CBG: No results for input(s): GLUCAP in the last 168 hours. Lipid Profile: No results for input(s): CHOL, HDL, LDLCALC, TRIG, CHOLHDL, LDLDIRECT in the last 72 hours. Thyroid Function Tests: No results for input(s): TSH, T4TOTAL, FREET4, T3FREE, THYROIDAB in the last 72 hours. Anemia Panel: No results for input(s): VITAMINB12, FOLATE, FERRITIN, TIBC, IRON, RETICCTPCT in the last 72 hours. Sepsis Labs: No results for input(s): PROCALCITON, LATICACIDVEN in the last 168 hours.  Recent Results (from the past 240 hour(s))  Culture, blood (Routine X 2) w Reflex to ID Panel     Status:  Abnormal   Collection Time: 08/08/19  7:27 AM   Specimen: BLOOD RIGHT FOREARM  Result Value Ref Range Status   Specimen Description BLOOD RIGHT FOREARM  Final   Special Requests   Final    BOTTLES DRAWN AEROBIC AND ANAEROBIC Blood Culture results may not be optimal due to an inadequate volume of blood received in culture bottles   Culture  Setup Time   Final    IN BOTH AEROBIC AND ANAEROBIC BOTTLES GRAM POSITIVE COCCI IN CLUSTERS CRITICAL RESULT CALLED TO, READ BACK BY AND VERIFIED WITH: Karsten Ro [facility] 08/09/19 0102 JDW Performed at [facility], [place] [address], [place], [place] [address]    Culture STAPHYLOCOCCUS AUREUS (A)  Final   Report Status 08/10/2019 FINAL  Final   Organism ID, Bacteria STAPHYLOCOCCUS AUREUS  Final      Susceptibility   Staphylococcus aureus - MIC*    CIPROFLOXACIN <=0.5 SENSITIVE Sensitive     ERYTHROMYCIN >=8 RESISTANT Resistant     GENTAMICIN <=0.5 SENSITIVE Sensitive     OXACILLIN 0.5 SENSITIVE Sensitive     TETRACYCLINE <=1 SENSITIVE Sensitive     VANCOMYCIN <=0.5 SENSITIVE Sensitive     TRIMETH/SULFA <=10 SENSITIVE Sensitive     CLINDAMYCIN <=0.25 SENSITIVE Sensitive     RIFAMPIN <=0.5  SENSITIVE Sensitive     Inducible Clindamycin NEGATIVE Sensitive     * STAPHYLOCOCCUS AUREUS  Blood Culture ID Panel (Reflexed)     Status: Abnormal   Collection Time: 08/08/19  7:27 AM  Result Value Ref Range Status   Enterococcus species NOT DETECTED NOT DETECTED Final   Listeria monocytogenes NOT DETECTED NOT DETECTED Final   Staphylococcus species DETECTED (A) NOT DETECTED Final    Comment: CRITICAL RESULT CALLED TO, READ BACK BY AND VERIFIED WITH: J LEDFORD PHARMD 08/09/19 0102 JDW    Staphylococcus aureus (BCID) DETECTED (A) NOT DETECTED Final    Comment: Methicillin (oxacillin) susceptible Staphylococcus aureus (MSSA). Preferred therapy is anti staphylococcal beta lactam antibiotic (Cefazolin or Nafcillin), unless clinically contraindicated. CRITICAL RESULT CALLED TO, READ BACK BY AND VERIFIED WITH: J LEDFORD [facility] 08/09/19 0102 JDW    Methicillin resistance NOT DETECTED NOT DETECTED Final   Streptococcus species NOT DETECTED NOT DETECTED Final   Streptococcus agalactiae NOT DETECTED NOT DETECTED Final   Streptococcus pneumoniae NOT DETECTED NOT DETECTED Final   Streptococcus pyogenes NOT DETECTED NOT DETECTED Final   Acinetobacter baumannii NOT DETECTED NOT DETECTED Final   Enterobacteriaceae species NOT DETECTED NOT DETECTED Final   Enterobacter cloacae complex NOT DETECTED NOT DETECTED Final   Escherichia coli NOT DETECTED NOT DETECTED Final   Klebsiella oxytoca NOT DETECTED NOT DETECTED Final   Klebsiella pneumoniae NOT DETECTED NOT DETECTED Final   Proteus species NOT DETECTED NOT DETECTED Final   Serratia marcescens NOT DETECTED NOT DETECTED Final   Haemophilus influenzae NOT DETECTED NOT DETECTED Final   Neisseria meningitidis NOT DETECTED NOT DETECTED Final   Pseudomonas aeruginosa NOT DETECTED NOT DETECTED Final   Candida albicans NOT DETECTED NOT DETECTED Final   Candida glabrata NOT DETECTED NOT DETECTED Final   Candida krusei NOT DETECTED NOT DETECTED Final    Candida parapsilosis NOT DETECTED NOT DETECTED Final   Candida tropicalis NOT DETECTED NOT DETECTED Final    Comment: Performed at [facility], [place]. [address], [place], [place] [address]  SARS Coronavirus 2 by RT PCR (hospital order, performed in [facility] hospital lab) Nasopharyngeal Nasopharyngeal Swab     Status: None   Collection  Time: 08/08/19  9:02 AM   Specimen: Nasopharyngeal Swab  Result Value Ref Range Status   SARS Coronavirus 2 NEGATIVE NEGATIVE Final    Comment: (NOTE) SARS-CoV-2 target nucleic acids are NOT DETECTED.  The SARS-CoV-2 RNA is generally detectable in upper and lower respiratory specimens during the acute phase of infection. The lowest concentration of SARS-CoV-2 viral copies this assay can detect is 250 copies / mL. A negative result does not preclude SARS-CoV-2 infection and should not be used as the sole basis for treatment or other patient management decisions.  A negative result may occur with improper specimen collection / handling, submission of specimen other than nasopharyngeal swab, presence of viral mutation(s) within the areas targeted by this assay, and inadequate number of viral copies (<250 copies / mL). A negative result must be combined with clinical observations, patient history, and epidemiological information.  Fact Sheet for Patients:   StrictlyIdeas.no  Fact Sheet for Healthcare Providers: BankingDealers.co.za  This test is not yet approved or  cleared by the [place] FDA and has been authorized for detection and/or diagnosis of SARS-CoV-2 by FDA under an Emergency Use Authorization (EUA).  This EUA will remain in effect (meaning this test can be used) for the duration of the COVID-19 declaration under Section 564(b)(1) of the Act, 21 U.S.C. section 360bbb-3(b)(1), unless the authorization is terminated or revoked sooner.  Performed at [facility], [place] [address] [place], [place] [address]   Culture, blood (Routine X 2) w Reflex to ID Panel     Status: Abnormal   Collection Time: 08/08/19  5:11 PM   Specimen: BLOOD RIGHT HAND  Result Value Ref Range Status   Specimen Description BLOOD RIGHT HAND  Final   Special Requests   Final    BOTTLES DRAWN AEROBIC AND ANAEROBIC Blood Culture adequate volume   Culture  Setup Time   Final    GRAM POSITIVE COCCI IN CLUSTERS IN BOTH AEROBIC AND ANAEROBIC BOTTLES CRITICAL RESULT CALLED TO, READ BACK BY AND VERIFIED WITH: J. LEDFORD,PHARMD 0503 08/09/2019 T. TYSOR    Culture (A)  Final    STAPHYLOCOCCUS AUREUS SUSCEPTIBILITIES PERFORMED ON PREVIOUS CULTURE WITHIN THE LAST 5 DAYS. Performed at [facility], [place] [address], [place], [place] [address]    Report Status 08/11/2019 FINAL  Final  Culture, blood (routine x 2)     Status: None (Preliminary result)   Collection Time: 08/10/19  7:04 AM   Specimen: BLOOD  Result Value Ref Range Status   Specimen Description BLOOD RIGHT ANTECUBITAL  Final   Special Requests   Final    BOTTLES DRAWN AEROBIC ONLY Blood Culture results may not be optimal due to an inadequate volume of blood received in culture bottles   Culture   Final    NO GROWTH 4 DAYS Performed at [facility], [place] [address], [place], [place] [address]    Report Status PENDING  Incomplete  Culture, blood (routine x 2)     Status: None (Preliminary result)   Collection Time: 08/10/19  7:04 AM   Specimen: BLOOD RIGHT HAND  Result Value Ref Range Status   Specimen Description BLOOD RIGHT HAND  Final   Special Requests   Final    BOTTLES DRAWN AEROBIC ONLY Blood Culture adequate volume   Culture   Final    NO GROWTH 4 DAYS Performed at [facility], [place] [address] [place], [place] [address]    Report Status PENDING  Incomplete         Radiology Studies: No results found.      Scheduled Meds: . amLODipine  5 mg Oral Daily  . Chlorhexidine Gluconate Cloth  6 each  Topical Daily  . enoxaparin (LOVENOX) injection  40 mg Subcutaneous Q24H  . folic acid  1 mg Oral Daily  . multivitamin with minerals  1 tablet Oral Daily  . nicotine  14 mg Transdermal Daily  . rifampin  300 mg Oral BID WC  . senna-docusate  1 tablet Oral BID  . sodium chloride flush  10-40 mL Intracatheter Q12H  . thiamine  100 mg Oral Daily   Continuous Infusions: .  ceFAZolin (ANCEF) IV 2 g (08/14/19 0558)     LOS: 6 days   Time spent= 35 mins    Spurgeon Gancarz Arsenio Loader, MD [facility]  If 7PM-7AM, please contact night-coverage  08/14/2019, 11:41 AM

## 2019-08-14 NOTE — Consult Note (Signed)
Physical Medicine and Rehabilitation Consult   Reason for Consult: Functional deficits due to epidural abscess.  Referring Physician: Dr. Reesa Chew   HPI: Monica Eaton is a 44 y.o. female with history of chronic neck pain with neuropathy, ETOH and IV drug abuse who was admitted on 08/08/19 after sudden onset of severe neck pain with burning in BUE and decreased sensation BLE.  She has been homeless for months, moved to [place] three weeks ago to get clean and has been living on her friend's couch. She had a fight with parents and relapsed . UDS positive for benzo's, opiates and THC. She was found to have advanced chronic cervical degeneration with trace fluid in C5/C6 space suspicious for diskitis, moderate multifactorial stenosis C3/4- C6/C7 with mild signal abnormality--question edema v/s developing myelomalacia. 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 She has leucocytosis with WBC-19.4 and BC positive for MSSA. She was evalauted by Dr. Joaquim Nam and underwent posterior C3-C7 laminectomies with fusion the same day. Post op continued to have issues with pain, weakness and numbness but clonus resolved per reports.   ID following for input on antibiotic regimen and recommended TEE for full work up as 2D echo showed normal EF without vegetation. On IV Cefazolin and rifampin added. Repeat results from [facility] pending. She continues to prefer IV dilaudid every 2 hours with limited use of oxycodone prn. Therapy evaluations completed today and patient limited by pain, anxiety, weakness and dizziness. She was able to sit at EOB for 5 minutes. CIR recommended due to functional decline.     Review of Systems  Constitutional: Negative for chills and fever.  HENT: Negative for hearing loss and tinnitus.   Eyes: Negative for blurred vision and double vision.  Respiratory: Negative for cough and shortness of breath.   Cardiovascular:  Negative for chest pain, palpitations and leg swelling.  Gastrointestinal: Negative for heartburn and nausea.       Incontinent of bladder  Musculoskeletal: Positive for back pain and neck pain.  Skin: Negative for itching and rash.  Neurological: Positive for tingling (burning and tingling worse ) and weakness. Negative for dizziness.  Psychiatric/Behavioral: Positive for suicidal ideas. The patient is nervous/anxious and has insomnia.      Past Medical History:  Diagnosis Date  . Alcohol abuse   . IV drug user    heroin  . Neck pain, chronic   . Neuropathy       Past Surgical History:  Procedure Laterality Date  . BREAST ENHANCEMENT SURGERY    . CESAREAN SECTION    . FINGER FRACTURE SURGERY Left    left ring finger fracture.  Marland Kitchen RADIOLOGY WITH ANESTHESIA N/A 08/08/2019   Procedure: MRI WITH ANESTHESIA;  Surgeon: Radiologist, Medication, MD;  Location: [facility];  Service: Radiology;  Laterality: N/A;  . WISDOM TOOTH EXTRACTION      Family History  Problem Relation Age of Onset  . Parkinson's disease Mother   . Arthritis Father      Social History:  Has been homeless for months. She has been estranged from family but reports that she plans on staying with parents after discharge. Mother has PD--falls all the time but father in great health. She smokes tobacco --1 PPD on and off since 44 years old. She drinks a 1/5 of Vodka per day for past 10 years. She has been using heroin and met--IV drug use for past 4 months.  Allergies  Allergen Reactions  . Cefaclor   . Cephalosporins   . Nitrofurantoin   . Penicillins     Spoke with patient's family. They report that Monica Eaton had a rash/hives when she was 68-12 years old. She did not have any shortness of breath and they do not recall having to take her to the doctor's office or hospital to treat the reaction.     Medications Prior to Admission  Medication Sig Dispense Refill  . acetaminophen (TYLENOL) 500 MG tablet Take 2,000  mg by mouth every 6 (six) hours as needed for mild pain.      Home: Home Living Family/patient expects to be discharged to:: Unsure Additional Comments: Pt endorses that PTA she was homeless and sleeping in her car. She has been in contact with her parents in the past few days and plans to d/c to their residence. "they will have a 1 story home with no stairs, they're moving in 2-34month"   Functional History: Prior Function Level of Independence: Independent Comments: pt has 2 children ages 144and 6 who she reports are staying with her ex.  Functional Status:  Mobility: Bed Mobility Overal bed mobility: Needs Assistance Bed Mobility: Rolling, Sidelying to Sit, Sit to Sidelying Rolling: Min assist Sidelying to sit: Mod assist, +2 for safety/equipment Sit to sidelying: Mod assist, +2 for safety/equipment General bed mobility comments: Pt very focused on fear of neck movements and increased pain. asked to have someone "stabilize my head" coming to EOB position. Assist to poswerup trunk. Pt sat EOB x 5 minutes Transfers Overall transfer level: Needs assistance Equipment used: 2 person hand held assist Transfers: Sit to/from Stand, Stand Pivot Transfers Sit to Stand: Mod assist, +2 physical assistance Stand pivot transfers: Mod assist, +2 physical assistance General transfer comment: pt declined attempt 2* pain, dizziness, fear of falling Ambulation/Gait General Gait Details: unable this date    ADL: ADL Overall ADL's : Needs assistance/impaired Eating/Feeding: Set up, Bed level Grooming: Sitting, Maximal assistance Grooming Details (indicate cue type and reason): needs BUE support sitting EOB Upper Body Bathing: Maximal assistance, Sitting Upper Body Bathing Details (indicate cue type and reason): needs BUE support sitting EOB Lower Body Bathing: Moderate assistance Lower Body Bathing Details (indicate cue type and reason): very guarded with movements but physically able to roll  to each side with min guard to min A. and access legs in supine Upper Body Dressing : Moderate assistance Lower Body Dressing: Moderate assistance, Bed level General ADL Comments: Pt completed bed mobility and sat EOB with BUE support and min to min guard assist for 5 minutes. Pt fearful of falling and injurying neck responded well to extra time and encouragement. Endorsed dizziness and feeling like something was "off" after 5 minutes. Pt assisted back to bed. No transfers attempted this session  Cognition: Cognition Overall Cognitive Status: Within Functional Limits for tasks assessed Orientation Level: Oriented X4 Cognition Arousal/Alertness: Awake/alert Behavior During Therapy: Anxious, Impulsive Overall Cognitive Status: Within Functional Limits for tasks assessed Area of Impairment: Orientation, Attention, Memory, Following commands, Safety/judgement, Awareness, Problem solving Orientation Level: Disoriented to, Place, Time Current Attention Level: Focused Memory: Decreased short-term memory, Decreased recall of precautions Following Commands: Follows one step commands with increased time, Follows one step commands inconsistently Safety/Judgement: Decreased awareness of safety, Decreased awareness of deficits Awareness: Emergent ("i have to go poop") Problem Solving: Slow processing, Decreased initiation, Difficulty sequencing, Requires verbal cues, Requires tactile cues General Comments: Pt very anxious and guarded with movements. Very  fearful of damaging incision and/or falling "like I did yesterday" Pt unable to recall events of fall yesterday but does remember nurse was present. Pt internally distracted and verbalized feeling overwhelmed by current medical condition, past diffiuclties with family and drug abuse relapses. She verbalized desire to no longer use drugs and to have a better relationship with her parents. Provided therapeutic use of self and active listening space for pt to  try to prcoess her emotions/thoughts.    Blood pressure (!) 152/91, pulse 88, temperature 98 F (36.7 C), temperature source Oral, resp. rate 18, height '5\' 7"'  (1.702 m), weight 61.2 kg, SpO2 99 %.  Physical Exam  General: Internally distracted by pain and moaning. Reported that she was unable to focus due to pain. Her sister kept  feeding patient during exam. Her mood and affect changed dramatically once nurse come in with IV medication.   HEENT: Head is normocephalic, atraumatic, PERRLA, EOMI, sclera anicteric, oral mucosa pink and moist, dentition intact, ext ear canals clear,  Neck: Supple without JVD or lymphadenopathy Heart: Reg rate and rhythm. No murmurs rubs or gallops Chest: CTA bilaterally without wheezes, rales, or rhonchi; no distress Abdomen: Soft, non-tender, non-distended, bowel sounds positive. Extremities: No clubbing, cyanosis, or edema. Pulses are 2+ Skin: Clean and intact without signs of breakdown Neurological:     Mental Status: She is alert.     Sensory: Sensory deficit present.     Comments: Speech clear. Focused on getting pain medication--so much pain and does not last 2 hours. She was unable/unwilling to answer basic questions and poor effort with MMT.   Psych: Pt's affect is appropriate. Pt is cooperative GU: Hematuria noted in pure-wick canister.   Results for orders placed or performed during the hospital encounter of 08/07/19 (from the past 24 hour(s))  Basic metabolic panel     Status: Abnormal   Collection Time: 08/14/19  4:52 AM  Result Value Ref Range   Sodium 133 (L) 135 - 145 mmol/L   Potassium 3.5 3.5 - 5.1 mmol/L   Chloride 96 (L) 98 - 111 mmol/L   CO2 28 22 - 32 mmol/L   Glucose, Bld 127 (H) 70 - 99 mg/dL   BUN 6 6 - 20 mg/dL   Creatinine, Ser 0.46 0.44 - 1.00 mg/dL   Calcium 8.2 (L) 8.9 - 10.3 mg/dL   GFR calc non Af Amer >60 >60 mL/min   GFR calc Af Amer >60 >60 mL/min   Anion gap 9 5 - 15  CBC     Status: Abnormal   Collection Time:  08/14/19  4:52 AM  Result Value Ref Range   WBC 15.6 (H) 4.0 - 10.5 K/uL   RBC 3.18 (L) 3.87 - 5.11 MIL/uL   Hemoglobin 9.7 (L) 12.0 - 15.0 g/dL   HCT 28.4 (L) 36 - 46 %   MCV 89.3 80.0 - 100.0 fL   MCH 30.5 26.0 - 34.0 pg   MCHC 34.2 30.0 - 36.0 g/dL   RDW 12.2 11.5 - 15.5 %   Platelets 533 (H) 150 - 400 K/uL   nRBC 0.0 0.0 - 0.2 %  Magnesium     Status: None   Collection Time: 08/14/19  4:52 AM  Result Value Ref Range   Magnesium 2.1 1.7 - 2.4 mg/dL   No results found.   Assessment/Plan: Diagnosis: c/p C3-C7 laminectomies with fusion 1. Does the need for close, 24 hr/day medical supervision in concert with the patient's rehab needs make it unreasonable for  this patient to be served in a less intensive setting? Yes 2. Co-Morbidities requiring supervision/potential complications: cervical stenosis with neuropathy, ETOH and IV drug abuse, cord compression, MSSA bacteremia 3. Due to bladder management, bowel management, safety, skin/wound care, disease management, medication administration, pain management and patient education, does the patient require 24 hr/day rehab nursing? Yes 4. Does the patient require coordinated care of a physician, rehab nurse, therapy disciplines of PT, OT to address physical and functional deficits in the context of the above medical diagnosis(es)? Yes Addressing deficits in the following areas: balance, endurance, locomotion, strength, transferring, bowel/bladder control, bathing, dressing, feeding, grooming, toileting and psychosocial support 5. Can the patient actively participate in an intensive therapy program of at least 3 hrs of therapy per day at least 5 days per week? Yes 6. The potential for patient to make measurable gains while on inpatient rehab is good 7. Anticipated functional outcomes upon discharge from inpatient rehab are supervision  with PT, supervision with OT, supervision with SLP. 8. Estimated rehab length of stay to reach the above  functional goals is: 10-12 days 9. Anticipated discharge destination: Home 10. Overall Rehab/Functional Prognosis: good  RECOMMENDATIONS: This patient's condition is appropriate for continued rehabilitative care in the following setting: CIR Patient has agreed to participate in recommended program. Yes Note that insurance prior authorization may be required for reimbursement for recommended care.  Comment: Monica Eaton could be a good CIR candidate if medically stable (currently requiring IV dilaudid q2H) and if 24/7 supervision can be confirmed (she was homeless prior to admission but states she can stay with her parents who are home during the day).   Thank you for this consult. We will continue to follow in Monica Eaton's care.   Bary Leriche, PA-C 08/14/2019   I have personally performed a face to face diagnostic evaluation, including, but not limited to relevant history and physical exam findings, of this patient and developed relevant assessment and plan.  Additionally, I have reviewed and concur with the physician assistant's documentation above.  Leeroy Cha, MD

## 2019-08-15 LAB — BASIC METABOLIC PANEL
Anion gap: 9 (ref 5–15)
BUN: <5 mg/dL — ABNORMAL LOW (ref 6–20)
CO2: 29 mmol/L (ref 22–32)
Calcium: 9 mg/dL (ref 8.9–10.3)
Chloride: 97 mmol/L — ABNORMAL LOW (ref 98–111)
Creatinine, Ser: 0.49 mg/dL (ref 0.44–1.00)
GFR calc Af Amer: >60 mL/min (ref >60)
GFR calc non Af Amer: >60 mL/min (ref >60)
Glucose, Bld: 138 mg/dL — ABNORMAL HIGH (ref 70–99)
Potassium: 4.1 mmol/L (ref 3.5–5.1)
Sodium: 135 mmol/L (ref 135–145)

## 2019-08-15 LAB — CBC
HCT: 32.5 % — ABNORMAL LOW (ref 36.0–46.0)
Hemoglobin: 10.8 g/dL — ABNORMAL LOW (ref 12.0–15.0)
MCH: 30 pg (ref 26.0–34.0)
MCHC: 33.2 g/dL (ref 30.0–36.0)
MCV: 90.3 fL (ref 80.0–100.0)
Platelets: 644 10*3/uL — ABNORMAL HIGH (ref 150–400)
RBC: 3.6 MIL/uL — ABNORMAL LOW (ref 3.87–5.11)
RDW: 12.4 % (ref 11.5–15.5)
WBC: 13.2 10*3/uL — ABNORMAL HIGH (ref 4.0–10.5)
nRBC: 0 % (ref 0.0–0.2)

## 2019-08-15 LAB — CULTURE, BLOOD (ROUTINE X 2)
Culture: NO GROWTH
Culture: NO GROWTH
Special Requests: ADEQUATE

## 2019-08-15 LAB — MAGNESIUM: Magnesium: 2.1 mg/dL (ref 1.7–2.4)

## 2019-08-15 NOTE — Plan of Care (Signed)
  Problem: Education: Goal: Knowledge of General Education information will improve Description: Including pain rating scale, medication(s)/side effects and non-pharmacologic comfort measures Outcome: Progressing   Problem: Health Behavior/Discharge Planning: Goal: Ability to manage health-related needs will improve Outcome: Progressing  Patient verbalizes fall prevention strategies. Problem: Activity: Goal: Risk for activity intolerance will decrease Outcome: Progressing  Patien table to turn in bed with minimal assist from staff. Problem: Nutrition: Goal: Adequate nutrition will be maintained Outcome: Progressing  Family members and staff offering frequent snacks and assistance with tray setu p.

## 2019-08-15 NOTE — Progress Notes (Signed)
PROGRESS NOTE    Monica Eaton  QIO:[id number] DOB: June 05, 1975 DOA: 08/07/2019 PCP: Patient, No Pcp Per   Brief Narrative:  44 year old female with history of tobacco abuse and IV drug abuse presented with severe neck pain and upper back pain.  In the ED, she had leukocytosis with CRP of 9.4.  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 C2-3 nonsegmentation and 3 mm of anterolisthesis at C3-4 was also noted. An attempt for MRI cervical spine with contrast was unsuccessful due to patient not being able to lay still despite IV Dilaudid, Ativan and Valium.  She was subsequently transferred to [facility] for MRI under general anesthesia.  She was started on broad-spectrum IV antibiotics.  Neurosurgery was consulted.  After MRI of spine under general anesthesia, patient underwent surgical intervention by neurosurgery on 08/08/2019. She was subsequently found to have MSSA bacteremia and ID was consulted.    Assessment & Plan:   Principal Problem:   Cervical pain (neck) Active Problems:   IVDU (intravenous drug user)   Tobacco abuse   Neck pain   Bandemia   MSSA bacteremia   Epidural abscess   Discitis of cervical region   Hardware complicating wound infection ([place])   Severe neck pain secondary to acute cervical discitis/osteomyelitis MSSA bacteremia, POA -MRI cervical spine shows acute cervical discitis/osteomyelitis.  Underwent surgical intervention by neurosurgery -Seen by infectious disease-Cont Ancef and Rifampin.  Anticipate 3-4 weeks per infectious disease -Repeat cultures thus far remains negative.   Polysubstance abuse Tobacco use -Urine drug screen is positive for benzodiazepines, opiates and tetrahydrocannabinol.  Counseled to quit using this. Trazodone at night for sleep.    DVT prophylaxis: Lovenox Code  Status: Full Family Communication: None at bedside.   Disposition Plan: Status is: Inpatient  Remains inpatient appropriate because:Inpatient level of care appropriate due to severity of illness.  Might need inpatient 4 weeks of IV antibiotics   Dispo: The patient is from: Home  Anticipated d/c is to: Undermined  Anticipated d/c date is: > 3 days  Patient currently is not medically stable to d/c.   Body mass index is 21.13 kg/m.    Subjective: Doing okay no new complaints.  No acute events overnight.  Review of Systems Otherwise negative except as per HPI, including: General: Denies fever, chills, night sweats or unintended weight loss. Resp: Denies cough, wheezing, shortness of breath. Cardiac: Denies chest pain, palpitations, orthopnea, paroxysmal nocturnal dyspnea. GI: Denies abdominal pain, nausea, vomiting, diarrhea or constipation GU: Denies dysuria, frequency, hesitancy or incontinence MS: Denies muscle aches, joint pain or swelling Neuro: Denies headache, neurologic deficits (focal weakness, numbness, tingling), abnormal gait Psych: Denies anxiety, depression, SI/HI/AVH Skin: Denies new rashes or lesions ID: Denies sick contacts, exotic exposures, travel  Examination: Constitutional: Not in acute distress Respiratory: Clear to auscultation bilaterally Cardiovascular: Normal sinus rhythm, no rubs Abdomen: Nontender nondistended good bowel sounds Musculoskeletal: No edema noted Skin: No rashes seen Neurologic: CN 2-12 grossly intact.  And nonfocal Psychiatric: Normal judgment and insight. Alert and oriented x 3. Normal mood. Surgical dressing in her neck noted  Objective: Vitals:   08/14/19 2004 08/15/19 0013 08/15/19 0423 08/15/19 0842  BP: (!) 160/99 137/84 123/75 (!) 149/97  Pulse: 95 91 73 85  Resp: 17 16 16    Temp: 98.8 F (37.1 C) 98.3 F (36.8 C) 98.8 F (37.1 C) 98.5 F (36.9 C)  TempSrc:  Oral Oral Oral Oral   SpO2: 100% 98% 99% 100%  Weight:      Height:        Intake/Output Summary (Last 24 hours) at 08/15/2019 1039 Last data filed at 08/15/2019 0423 Gross per 24 hour  Intake 961.95 ml  Output 1050 ml  Net -88.05 ml   Filed Weights   08/07/19 2205 08/08/19 1253 08/08/19 1823  Weight: 59 kg 59 kg 61.2 kg     Data Reviewed:   CBC: Recent Labs  Lab 08/10/19 0704 08/10/19 0704 08/11/19 1000 08/12/19 0418 08/13/19 0418 08/14/19 0452 08/15/19 0528  WBC 13.2*   < > 14.0* 15.0* 17.0* 15.6* 13.2*  NEUTROABS 10.6*  --  11.6* 11.7* 14.1*  --   --   HGB 9.3*   < > 9.5* 10.2* 11.0* 9.7* 10.8*  HCT 28.8*   < > 28.5* 30.2* 31.6* 28.4* 32.5*  MCV 97.6   < > 93.1 91.2 90.0 89.3 90.3  PLT 265   < > 332 421* 549* 533* 644*   < > = values in this interval not displayed.   Basic Metabolic Panel: Recent Labs  Lab 08/09/19 0336 08/10/19 0704 08/11/19 1000 08/12/19 0418 08/13/19 0418 08/14/19 0452 08/15/19 0528  NA 136   < > 134* 135 133* 133* 135  K 3.8   < > 3.3* 3.4* 3.3* 3.5 4.1  CL 100   < > 99 99 96* 96* 97*  CO2 26   < > 25 24 26 28 29   GLUCOSE 141*   < > 127* 115* 120* 127* 138*  BUN 6   < > 5* 6 7 6  <5*  CREATININE 0.54   < > 0.47 0.38* 0.44 0.46 0.49  CALCIUM 8.8*   < > 8.3* 8.3* 8.4* 8.2* 9.0  MG 1.8   < > 1.8 1.8 1.9 2.1 2.1  PHOS 3.6  --   --   --   --   --   --    < > = values in this interval not displayed.   GFR: Estimated Creatinine Clearance: 88.5 mL/min (by C-G formula based on SCr of 0.49 mg/dL). Liver Function Tests: Recent Labs  Lab 08/09/19 0336 08/12/19 0418  AST 18 15  ALT 16 16  ALKPHOS 62 82  BILITOT 0.8 0.7  PROT 6.2* 5.6*  ALBUMIN 2.8* 2.2*   No results for input(s): LIPASE, AMYLASE in the last 168 hours. No results for input(s): AMMONIA in the last 168 hours. Coagulation Profile: Recent Labs  Lab 08/09/19 0336  INR 1.2   Cardiac Enzymes: No results for input(s): CKTOTAL, CKMB, CKMBINDEX, TROPONINI in the last 168 hours. BNP (last 3  results) No results for input(s): PROBNP in the last 8760 hours. HbA1C: No results for input(s): HGBA1C in the last 72 hours. CBG: No results for input(s): GLUCAP in the last 168 hours. Lipid Profile: No results for input(s): CHOL, HDL, LDLCALC, TRIG, CHOLHDL, LDLDIRECT in the last 72 hours. Thyroid Function Tests: No results for input(s): TSH, T4TOTAL, FREET4, T3FREE, THYROIDAB in the last 72 hours. Anemia Panel: No results for input(s): VITAMINB12, FOLATE, FERRITIN, TIBC, IRON, RETICCTPCT in the last 72 hours. Sepsis Labs: No results for input(s): PROCALCITON, LATICACIDVEN in the last 168 hours.  Recent Results (from the past 240 hour(s))  Culture, blood (Routine X 2) w Reflex to ID Panel     Status: Abnormal   Collection Time: 08/08/19  7:27 AM   Specimen: BLOOD RIGHT FOREARM  Result Value Ref Range  Status   Specimen Description BLOOD RIGHT FOREARM  Final   Special Requests   Final    BOTTLES DRAWN AEROBIC AND ANAEROBIC Blood Culture results may not be optimal due to an inadequate volume of blood received in culture bottles   Culture  Setup Time   Final    IN BOTH AEROBIC AND ANAEROBIC BOTTLES GRAM POSITIVE COCCI IN CLUSTERS CRITICAL RESULT CALLED TO, READ BACK BY AND VERIFIED WITH: Karsten Ro [facility] 08/09/19 0102 JDW Performed at [facility], [address], [place], [place] [address]    Culture STAPHYLOCOCCUS AUREUS (A)  Final   Report Status 08/10/2019 FINAL  Final   Organism ID, Bacteria STAPHYLOCOCCUS AUREUS  Final      Susceptibility   Staphylococcus aureus - MIC*    CIPROFLOXACIN <=0.5 SENSITIVE Sensitive     ERYTHROMYCIN >=8 RESISTANT Resistant     GENTAMICIN <=0.5 SENSITIVE Sensitive     OXACILLIN 0.5 SENSITIVE Sensitive     TETRACYCLINE <=1 SENSITIVE Sensitive     VANCOMYCIN <=0.5 SENSITIVE Sensitive     TRIMETH/SULFA <=10 SENSITIVE Sensitive     CLINDAMYCIN <=0.25 SENSITIVE Sensitive     RIFAMPIN <=0.5 SENSITIVE Sensitive     Inducible Clindamycin NEGATIVE  Sensitive     * STAPHYLOCOCCUS AUREUS  Blood Culture ID Panel (Reflexed)     Status: Abnormal   Collection Time: 08/08/19  7:27 AM  Result Value Ref Range Status   Enterococcus species NOT DETECTED NOT DETECTED Final   Listeria monocytogenes NOT DETECTED NOT DETECTED Final   Staphylococcus species DETECTED (A) NOT DETECTED Final    Comment: CRITICAL RESULT CALLED TO, READ BACK BY AND VERIFIED WITH: J LEDFORD PHARMD 08/09/19 0102 JDW    Staphylococcus aureus (BCID) DETECTED (A) NOT DETECTED Final    Comment: Methicillin (oxacillin) susceptible Staphylococcus aureus (MSSA). Preferred therapy is anti staphylococcal beta lactam antibiotic (Cefazolin or Nafcillin), unless clinically contraindicated. CRITICAL RESULT CALLED TO, READ BACK BY AND VERIFIED WITH: J LEDFORD [facility] 08/09/19 0102 JDW    Methicillin resistance NOT DETECTED NOT DETECTED Final   Streptococcus species NOT DETECTED NOT DETECTED Final   Streptococcus agalactiae NOT DETECTED NOT DETECTED Final   Streptococcus pneumoniae NOT DETECTED NOT DETECTED Final   Streptococcus pyogenes NOT DETECTED NOT DETECTED Final   Acinetobacter baumannii NOT DETECTED NOT DETECTED Final   Enterobacteriaceae species NOT DETECTED NOT DETECTED Final   Enterobacter cloacae complex NOT DETECTED NOT DETECTED Final   Escherichia coli NOT DETECTED NOT DETECTED Final   Klebsiella oxytoca NOT DETECTED NOT DETECTED Final   Klebsiella pneumoniae NOT DETECTED NOT DETECTED Final   Proteus species NOT DETECTED NOT DETECTED Final   Serratia marcescens NOT DETECTED NOT DETECTED Final   Haemophilus influenzae NOT DETECTED NOT DETECTED Final   Neisseria meningitidis NOT DETECTED NOT DETECTED Final   Pseudomonas aeruginosa NOT DETECTED NOT DETECTED Final   Candida albicans NOT DETECTED NOT DETECTED Final   Candida glabrata NOT DETECTED NOT DETECTED Final   Candida krusei NOT DETECTED NOT DETECTED Final   Candida parapsilosis NOT DETECTED NOT DETECTED Final    Candida tropicalis NOT DETECTED NOT DETECTED Final    Comment: Performed at [facility], [place]. [address], [place], [place] [address]  SARS Coronavirus 2 by RT PCR (hospital order, performed in [facility] hospital lab) Nasopharyngeal Nasopharyngeal Swab     Status: None   Collection Time: 08/08/19  9:02 AM   Specimen: Nasopharyngeal Swab  Result Value Ref Range Status   SARS Coronavirus  2 NEGATIVE NEGATIVE Final    Comment: (NOTE) SARS-CoV-2 target nucleic acids are NOT DETECTED.  The SARS-CoV-2 RNA is generally detectable in upper and lower respiratory specimens during the acute phase of infection. The lowest concentration of SARS-CoV-2 viral copies this assay can detect is 250 copies / mL. A negative result does not preclude SARS-CoV-2 infection and should not be used as the sole basis for treatment or other patient management decisions.  A negative result may occur with improper specimen collection / handling, submission of specimen other than nasopharyngeal swab, presence of viral mutation(s) within the areas targeted by this assay, and inadequate number of viral copies (<250 copies / mL). A negative result must be combined with clinical observations, patient history, and epidemiological information.  Fact Sheet for Patients:   StrictlyIdeas.no  Fact Sheet for Healthcare Providers: BankingDealers.co.za  This test is not yet approved or  cleared by the [place] FDA and has been authorized for detection and/or diagnosis of SARS-CoV-2 by FDA under an Emergency Use Authorization (EUA).  This EUA will remain in effect (meaning this test can be used) for the duration of the COVID-19 declaration under Section 564(b)(1) of the Act, 21 U.S.C. section 360bbb-3(b)(1), unless the authorization is terminated or revoked sooner.  Performed at [facility], [place] [address], [place], [place] [address]   Culture, blood (Routine X  2) w Reflex to ID Panel     Status: Abnormal   Collection Time: 08/08/19  5:11 PM   Specimen: BLOOD RIGHT HAND  Result Value Ref Range Status   Specimen Description BLOOD RIGHT HAND  Final   Special Requests   Final    BOTTLES DRAWN AEROBIC AND ANAEROBIC Blood Culture adequate volume   Culture  Setup Time   Final    GRAM POSITIVE COCCI IN CLUSTERS IN BOTH AEROBIC AND ANAEROBIC BOTTLES CRITICAL RESULT CALLED TO, READ BACK BY AND VERIFIED WITH: J. LEDFORD,PHARMD 0503 08/09/2019 T. TYSOR    Culture (A)  Final    STAPHYLOCOCCUS AUREUS SUSCEPTIBILITIES PERFORMED ON PREVIOUS CULTURE WITHIN THE LAST 5 DAYS. Performed at [facility], [place] [address], [place], [place] [address]    Report Status 08/11/2019 FINAL  Final  Culture, blood (routine x 2)     Status: None (Preliminary result)   Collection Time: 08/10/19  7:04 AM   Specimen: BLOOD  Result Value Ref Range Status   Specimen Description BLOOD RIGHT ANTECUBITAL  Final   Special Requests   Final    BOTTLES DRAWN AEROBIC ONLY Blood Culture results may not be optimal due to an inadequate volume of blood received in culture bottles   Culture   Final    NO GROWTH 4 DAYS Performed at [facility], [place] [address] [place], [place] [address]    Report Status PENDING  Incomplete  Culture, blood (routine x 2)     Status: None (Preliminary result)   Collection Time: 08/10/19  7:04 AM   Specimen: BLOOD RIGHT HAND  Result Value Ref Range Status   Specimen Description BLOOD RIGHT HAND  Final   Special Requests   Final    BOTTLES DRAWN AEROBIC ONLY Blood Culture adequate volume   Culture   Final    NO GROWTH 4 DAYS Performed at [facility], [place] [address], [place], [place] [address]    Report Status PENDING  Incomplete         Radiology Studies: No results found.      Scheduled  Meds: . amLODipine  5 mg Oral Daily  . Chlorhexidine Gluconate Cloth  6 each Topical Daily  . enoxaparin (LOVENOX) injection  40 mg  Subcutaneous Q24H  . folic acid  1 mg Oral Daily  . multivitamin with minerals  1 tablet Oral Daily  . nicotine  14 mg Transdermal Daily  . rifampin  300 mg Oral BID WC  . senna-docusate  1 tablet Oral BID  . sodium chloride flush  10-40 mL Intracatheter Q12H  . thiamine  100 mg Oral Daily   Continuous Infusions: .  ceFAZolin (ANCEF) IV 2 g (08/15/19 0607)     LOS: 7 days   Time spent= 35 mins    Wilborn Membreno Arsenio Loader, MD [facility]  If 7PM-7AM, please contact night-coverage  08/15/2019, 10:39 AM

## 2019-08-15 NOTE — Progress Notes (Signed)
Neurosurgery Service Progress Note  Subjective: No acute events overnight, no new complaints  Objective: Vitals:   08/14/19 1553 08/14/19 2004 08/15/19 0013 08/15/19 0423  BP: (!) 148/88 (!) 160/99 137/84 123/75  Pulse: 84 95 91 73  Resp: 18 17 16 16   Temp: 97.8 F (36.6 C) 98.8 F (37.1 C) 98.3 F (36.8 C) 98.8 F (37.1 C)  TempSrc: Oral Oral Oral Oral  SpO2: 100% 100% 98% 99%  Weight:      Height:       Temp (24hrs), Avg:98.3 F (36.8 C), Min:97.8 F (36.6 C), Max:98.8 F (37.1 C)  CBC Latest Ref Rng & Units 08/15/2019 08/14/2019 08/13/2019  WBC 4.0 - 10.5 K/uL 13.2(H) 15.6(H) 17.0(H)  Hemoglobin 12.0 - 15.0 g/dL 10.8(L) 9.7(L) 11.0(L)  Hematocrit 36 - 46 % 32.5(L) 28.4(L) 31.6(L)  Platelets 150 - 400 K/uL 644(H) 533(H) 549(H)   BMP Latest Ref Rng & Units 08/15/2019 08/14/2019 08/13/2019  Glucose 70 - 99 mg/dL 138(H) 127(H) 120(H)  BUN 6 - 20 mg/dL <5(L) 6 7  Creatinine 0.44 - 1.00 mg/dL 0.49 0.46 0.44  Sodium 135 - 145 mmol/L 135 133(L) 133(L)  Potassium 3.5 - 5.1 mmol/L 4.1 3.5 3.3(L)  Chloride 98 - 111 mmol/L 97(L) 96(L) 96(L)  CO2 22 - 32 mmol/L 29 28 26   Calcium 8.9 - 10.3 mg/dL 9.0 8.2(L) 8.4(L)    Intake/Output Summary (Last 24 hours) at 08/15/2019 0801 Last data filed at 08/15/2019 0423 Gross per 24 hour  Intake 961.95 ml  Output 1050 ml  Net -88.05 ml    Current Facility-Administered Medications:  .  acetaminophen (TYLENOL) tablet 650 mg, 650 mg, Oral, Q6H PRN, 650 mg at 08/15/19 0038 **OR** acetaminophen (TYLENOL) suppository 650 mg, 650 mg, Rectal, Q6H PRN, Adefeso, Oladapo, DO .  amLODipine (NORVASC) tablet 5 mg, 5 mg, Oral, Daily, Amin, Ankit Chirag, MD, 5 mg at 08/14/19 0939 .  bisacodyl (DULCOLAX) suppository 10 mg, 10 mg, Rectal, Daily PRN, Alekh, Kshitiz, MD .  ceFAZolin (ANCEF) IVPB 2g/100 mL premix, 2 g, Intravenous, Q8H, Tommy Medal, Lavell Islam, MD, Last Rate: 200 mL/hr at 08/15/19 0607, 2 g at 08/15/19 0607 .  Chlorhexidine Gluconate Cloth 2 % PADS 6 each,  6 each, Topical, Daily, [name], MD, 6 each at 08/14/19 0815 .  enoxaparin (LOVENOX) injection 40 mg, 40 mg, Subcutaneous, Q24H, Adefeso, Oladapo, DO, 40 mg at 08/14/19 1300 .  folic acid (FOLVITE) tablet 1 mg, 1 mg, Oral, Daily, Shela Leff, MD, 1 mg at 08/14/19 0819 .  HYDROmorphone (DILAUDID) injection 1-2 mg, 1-2 mg, Intravenous, Q2H PRN, Starla Link, Kshitiz, MD, 2 mg at 08/15/19 0603 .  methocarbamol (ROBAXIN) tablet 750 mg, 750 mg, Oral, Q6H PRN, Starla Link, Kshitiz, MD, 750 mg at 08/14/19 2248 .  multivitamin with minerals tablet 1 tablet, 1 tablet, Oral, Daily, Shela Leff, MD, 1 tablet at 08/14/19 0818 .  nicotine (NICODERM CQ - dosed in mg/24 hours) patch 14 mg, 14 mg, Transdermal, Daily, Blount, Xenia T, NP, 14 mg at 08/14/19 0819 .  oxyCODONE (Oxy IR/ROXICODONE) immediate release tablet 5-10 mg, 5-10 mg, Oral, Q4H PRN, Starla Link, Kshitiz, MD, 5 mg at 08/15/19 0036 .  polyethylene glycol (MIRALAX / GLYCOLAX) packet 17 g, 17 g, Oral, Daily PRN, Alekh, Kshitiz, MD .  rifampin (RIFADIN) capsule 300 mg, 300 mg, Oral, BID WC, Golden Circle, FNP, 300 mg at 08/14/19 1629 .  senna-docusate (Senokot-S) tablet 1 tablet, 1 tablet, Oral, BID, Starla Link, Kshitiz, MD, 1 tablet at 08/14/19 2039 .  sodium chloride flush (NS) 0.9 % injection 10-40 mL, 10-40 mL, Intracatheter, Q12H, Amin, Ankit Chirag, MD, 10 mL at 08/14/19 1949 .  sodium chloride flush (NS) 0.9 % injection 10-40 mL, 10-40 mL, Intracatheter, PRN, Amin, Ankit Chirag, MD, 10 mL at 08/14/19 1948 .  thiamine tablet 100 mg, 100 mg, Oral, Daily, 100 mg at 08/14/19 0818 **OR** [DISCONTINUED] thiamine (B-1) injection 100 mg, 100 mg, Intravenous, Daily, Shela Leff, MD .  traZODone (DESYREL) tablet 100 mg, 100 mg, Oral, QHS PRN, Damita Lack, MD, 100 mg at 08/14/19 2039   Physical Exam: Strength diffusely 4/5, diffusely decreased sensation x4, +R hoffman's, reflexes 2+ with ankle clonus incision clean / dry /  intact  Assessment & Plan: 44 y.o. woman s/p C3-7 lami C3-6 PSIF, recovering well.  -activity as tolerated, no collar needed / mandatory, but can wear a soft or hard collar for comfort  [name]  08/15/19 8:01 AM

## 2019-08-15 NOTE — Progress Notes (Signed)
Inpatient Rehab Admissions Coordinator:   Met with patient at bedside to discuss recommendations for CIR.  Note pt with active IVDU and need for IV abx for 3-4 weeks.  CIR cannot accept pts with IVDU and a PICC line for long term abx so we will sign off at this time. If therapy continues to recommend CIR when IV abx course has concluded, please feel free to reorder a consult and we will re-evaluate her at that time.   Shann Medal, PT, DPT Admissions Coordinator [phone] 08/15/19  11:39 AM

## 2019-08-15 NOTE — Progress Notes (Signed)
Physical Therapy Treatment Patient Details Name: Monica Eaton MRN: [id number] DOB: 1975-03-20 Today's Date: 08/15/2019    History of Present Illness Monica Eaton is a 44 y.o. female with medical history significant for tobacco abuse and IV drug abuse who presents to the emergency department due to sudden onset of upper back pain which started about 3 -4 hours PTA.  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 Pt underwent, s/p C3-6 OLaminectomy & fusion.     PT Comments    Pt was seen for mobility on to side of bed, noting her struggle to assist versus resist the PT assistance to sit up and balance.  Her plan is to continue to work on mobility and get pt to rehab setting if possible.   CIR has signed off for now, will assume SNF is still the option.  See acutely for strengthening and balance skills as tolerated.   Follow Up Recommendations  SNF;Supervision/Assistance - 24 hour;CIR     Equipment Recommendations  Rolling walker with 5" wheels    Recommendations for Other Services       Precautions / Restrictions Precautions Precautions: Cervical Precaution Booklet Issued: Yes (comment) Precaution Comments: with impaired cognition pt unable to comprehend or recall precautions Restrictions Weight Bearing Restrictions: No    Mobility  Bed Mobility Overal bed mobility: Needs Assistance Bed Mobility: Sidelying to Sit;Sit to Sidelying Rolling: Min assist Sidelying to sit: Mod assist     Sit to sidelying: Mod assist General bed mobility comments: pt sat on side of bed with encouragement and at one point asked to lie down a minute and sit right back up  Transfers Overall transfer level: Needs assistance               General transfer comment: refused  Ambulation/Gait                 Stairs             Wheelchair Mobility    Modified Rankin (Stroke Patients Only)       Balance Overall  balance assessment: Needs assistance Sitting-balance support: Feet supported Sitting balance-Leahy Scale: Poor                                      Cognition Arousal/Alertness: Awake/alert Behavior During Therapy: Anxious;Impulsive Overall Cognitive Status: Within Functional Limits for tasks assessed Area of Impairment: Problem solving;Awareness;Safety/judgement;Following commands;Memory                 Orientation Level: Situation Current Attention Level: Selective Memory: Decreased recall of precautions;Decreased short-term memory Following Commands: Follows one step commands inconsistently;Follows one step commands with increased time Safety/Judgement: Decreased awareness of safety;Decreased awareness of deficits Awareness: Intellectual;Anticipatory Problem Solving: Slow processing;Requires verbal cues;Requires tactile cues General Comments: distracted and having difficulty standing and sitting on side of bed      Exercises      General Comments General comments (skin integrity, edema, etc.): Pt was seen for mobility on side of bed, could not sit without min to mod assist      Pertinent Vitals/Pain Pain Assessment: Faces Faces Pain Scale: Hurts worst Pain Location: neck with activity, 6/10 at rest Pain Descriptors / Indicators: Grimacing;Guarding;Operative site guarding Pain Intervention(s): Limited activity within patient's tolerance;Monitored during session;Premedicated before session;Repositioned    Home Living  Prior Function            PT Goals (current goals can now be found in the care plan section) Acute Rehab PT Goals Patient Stated Goal: to walk and to not move Progress towards PT goals: Progressing toward goals    Frequency    Min 5X/week      PT Plan Current plan remains appropriate    Co-evaluation              AM-PAC PT "6 Clicks" Mobility   Outcome Measure  Help needed turning from  your back to your side while in a flat bed without using bedrails?: A Little Help needed moving from lying on your back to sitting on the side of a flat bed without using bedrails?: A Lot Help needed moving to and from a bed to a chair (including a wheelchair)?: A Lot Help needed standing up from a chair using your arms (e.g., wheelchair or bedside chair)?: A Lot Help needed to walk in hospital room?: A Lot Help needed climbing 3-5 steps with a railing? : Total 6 Click Score: 12    End of Session   Activity Tolerance: Patient limited by fatigue;Patient limited by lethargy;Patient limited by pain Patient left: in bed;with call bell/phone within reach;with chair alarm set;with family/visitor present Nurse Communication: Mobility status PT Visit Diagnosis: Unsteadiness on feet (R26.81);Difficulty in walking, not elsewhere classified (R26.2)     Time: 1610-9604 PT Time Calculation (min) (ACUTE ONLY): 34 min  Charges:  $Therapeutic Activity: 8-22 mins                    Ramond Dial 08/15/2019, 5:41 PM  Mee Hives, PT MS Acute Rehab Dept. Number: [place] and [place]

## 2019-08-16 LAB — BASIC METABOLIC PANEL
Anion gap: 10 (ref 5–15)
BUN: 7 mg/dL (ref 6–20)
CO2: 27 mmol/L (ref 22–32)
Calcium: 8.9 mg/dL (ref 8.9–10.3)
Chloride: 99 mmol/L (ref 98–111)
Creatinine, Ser: 0.45 mg/dL (ref 0.44–1.00)
GFR calc Af Amer: >60 mL/min (ref >60)
GFR calc non Af Amer: >60 mL/min (ref >60)
Glucose, Bld: 141 mg/dL — ABNORMAL HIGH (ref 70–99)
Potassium: 3.9 mmol/L (ref 3.5–5.1)
Sodium: 136 mmol/L (ref 135–145)

## 2019-08-16 LAB — CBC
HCT: 33.3 % — ABNORMAL LOW (ref 36.0–46.0)
Hemoglobin: 11.1 g/dL — ABNORMAL LOW (ref 12.0–15.0)
MCH: 30.4 pg (ref 26.0–34.0)
MCHC: 33.3 g/dL (ref 30.0–36.0)
MCV: 91.2 fL (ref 80.0–100.0)
Platelets: 780 10*3/uL — ABNORMAL HIGH (ref 150–400)
RBC: 3.65 MIL/uL — ABNORMAL LOW (ref 3.87–5.11)
RDW: 12.6 % (ref 11.5–15.5)
WBC: 14.5 10*3/uL — ABNORMAL HIGH (ref 4.0–10.5)
nRBC: 0 % (ref 0.0–0.2)

## 2019-08-16 LAB — MAGNESIUM: Magnesium: 2.1 mg/dL (ref 1.7–2.4)

## 2019-08-16 NOTE — Progress Notes (Signed)
Physical Therapy Treatment Patient Details Name: Monica Eaton MRN: [id number] DOB: [birth year] Today's Date: 08/16/2019    History of Present Illness Monica Eaton is a 44 y.o. female with medical history significant for tobacco abuse and IV drug abuse who presents to the emergency department due to sudden onset of upper back pain which started about 3 -4 hours PTA.  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 Pt underwent, s/p C3-6 OLaminectomy & fusion.     PT Comments    Patient remains limited by anxiety. She was able to stand and transfer to the chair today but require mod-max cuing and encouragement. When standing her legs appeared steady but she verbalized fear of taking steps.  She would benefit from continued skilled therapy to increase her gait distance and confidence.   Follow Up Recommendations  SNF;Supervision/Assistance - 24 hour;CIR     Equipment Recommendations  Rolling walker with 5" wheels    Recommendations for Other Services       Precautions / Restrictions Precautions Precautions: Cervical Precaution Booklet Issued: Yes (comment) Precaution Comments: with impaired cognition pt unable to comprehend or recall precautions Restrictions Weight Bearing Restrictions: No    Mobility  Bed Mobility Overal bed mobility: Needs Assistance Bed Mobility: Sidelying to Sit;Sit to Sidelying   Sidelying to sit: Min assist     Sit to sidelying: Min assist General bed mobility comments: min a for log roll and to sit. Improved from last session   Transfers Overall transfer level: Needs assistance Equipment used: Rolling walker (2 wheeled) Transfers: Sit to/from [facility] Sit to Stand: Mod assist;+2 physical assistance;+2 safety/equipment Stand pivot transfers: Mod assist;+2 physical assistance;+2 safety/equipment       General transfer comment: mod a to stand. Once standing able to march  in palce.. She was then able to take small steps to the chair. Mod cuing and assist to bend her knees to sit.   Ambulation/Gait Ambulation/Gait assistance: Min guard Gait Distance (Feet): 3 Feet Assistive device: Rolling walker (2 wheeled) Gait Pattern/deviations: Step-to pattern Gait velocity: decreased Gait velocity interpretation: <1.31 ft/sec, indicative of household ambulator General Gait Details: once standing able to take steps to the chair. Patient remained anxious but able to perfrom   Stairs             Wheelchair Mobility    Modified Rankin (Stroke Patients Only)       Balance Overall balance assessment: Needs assistance Sitting-balance support: Feet supported Sitting balance-Leahy Scale: Poor Sitting balance - Comments: mod cuing to put feet on the floor. Patient leans back. Able to correct with cuing    Standing balance support: Bilateral upper extremity supported Standing balance-Leahy Scale: Poor Standing balance comment: dependent on physical assist                            Cognition Arousal/Alertness: Awake/alert Behavior During Therapy: Anxious;Impulsive Overall Cognitive Status: Within Functional Limits for tasks assessed Area of Impairment: Problem solving;Awareness;Safety/judgement;Following commands;Memory                 Orientation Level: Situation Current Attention Level: Selective Memory: Decreased recall of precautions         General Comments: very anxious with basic movements       Exercises      General Comments        Pertinent Vitals/Pain Pain Assessment: Faces Pain Score: 9  Faces Pain Scale: Hurts little  more Pain Location: neck with activity, reports 9/10 at rest  Pain Descriptors / Indicators: Grimacing;Guarding;Operative site guarding Pain Intervention(s): Limited activity within patient's tolerance;Monitored during session    Home Living                      Prior Function             PT Goals (current goals can now be found in the care plan section) Acute Rehab PT Goals Patient Stated Goal: to walk and to not move PT Goal Formulation: With patient Time For Goal Achievement: 08/27/19 Potential to Achieve Goals: Fair Progress towards PT goals: Progressing toward goals    Frequency    Min 5X/week      PT Plan Current plan remains appropriate    Co-evaluation              AM-PAC PT "6 Clicks" Mobility   Outcome Measure  Help needed turning from your back to your side while in a flat bed without using bedrails?: A Little Help needed moving from lying on your back to sitting on the side of a flat bed without using bedrails?: A Lot Help needed moving to and from a bed to a chair (including a wheelchair)?: A Lot Help needed standing up from a chair using your arms (e.g., wheelchair or bedside chair)?: A Lot Help needed to walk in hospital room?: A Lot Help needed climbing 3-5 steps with a railing? : Total 6 Click Score: 12    End of Session   Activity Tolerance: Patient limited by fatigue;Patient limited by lethargy;Patient limited by pain Patient left: in bed;with call bell/phone within reach;with chair alarm set;with family/visitor present Nurse Communication: Mobility status PT Visit Diagnosis: Unsteadiness on feet (R26.81);Difficulty in walking, not elsewhere classified (R26.2)     Time: 8119-1478 PT Time Calculation (min) (ACUTE ONLY): 17 min  Charges:  $Therapeutic Activity: 8-22 mins                       Carney Living PT DPT  08/16/2019, 1:53 PM

## 2019-08-16 NOTE — Progress Notes (Signed)
Subjective: Patient reports posterior cervical neck pain that is currently a 9/10 pain. She stated she was just given some pain medication right before I entered the room. PT at bedside working on ambulating patient. No acute events overnight were reported.   Objective: Vital signs in last 24 hours: Temp:  [98.2 F (36.8 C)-99.8 F (37.7 C)] 98.7 F (37.1 C) (07/10 0816) Pulse Rate:  [79-92] 79 (07/10 0816) Resp:  [18] 18 (07/10 0816) BP: (148-165)/(86-98) 158/91 (07/10 0816) SpO2:  [100 %] 100 % (07/10 0816)  Intake/Output from previous day: 07/09 0701 - 07/10 0700 In: -  Out: 1000 [Urine:1000] Intake/Output this shift: Total I/O In: -  Out: 300 [Urine:300]  Physical Exam: Strength diffusely 4/5, diffusely decreased sensation x4, +R hoffman's, reflexes 2+ with ankle clonus incision clean / dry / intact  Lab Results: Recent Labs    08/14/19 0452 08/15/19 0528  WBC 15.6* 13.2*  HGB 9.7* 10.8*  HCT 28.4* 32.5*  PLT 533* 644*   BMET Recent Labs    08/14/19 0452 08/15/19 0528  NA 133* 135  K 3.5 4.1  CL 96* 97*  CO2 28 29  GLUCOSE 127* 138*  BUN 6 <5*  CREATININE 0.46 0.49  CALCIUM 8.2* 9.0    Studies/Results: No results found.  Assessment/Plan: The patient is s/p C3-7 lami C3-6 PSIF, recovering well. No collar needed / mandatory, but can wear a soft or hard collar for comfort. Continue working with therapies.      LOS: 8 days    Peggyann Shoals, MD 08/16/2019, 11:31 AM

## 2019-08-16 NOTE — Progress Notes (Signed)
PROGRESS NOTE    Monica Eaton  TDV:[id number] DOB: [birth year] DOA: 08/07/2019 PCP: Patient, No Pcp Per   Brief Narrative:  44 year old female with history of tobacco abuse and IV drug abuse presented with severe neck pain and upper back pain.  In the ED, she had leukocytosis with CRP of 9.4.  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 C2-3 nonsegmentation and 3 mm of anterolisthesis at C3-4 was also noted. An attempt for MRI cervical spine with contrast was unsuccessful due to patient not being able to lay still despite IV Dilaudid, Ativan and Valium.  She was subsequently transferred [facility] for [facility] under general anesthesia.  She was started on broad-spectrum IV antibiotics.  Neurosurgery was consulted.  After MRI of spine under general anesthesia, patient underwent surgical intervention by neurosurgery on 08/08/2019. She was subsequently found to have MSSA bacteremia and ID was consulted.    Assessment & Plan:   Principal Problem:   Cervical pain (neck) Active Problems:   IVDU (intravenous drug user)   Tobacco abuse   Neck pain   Bandemia   MSSA bacteremia   Epidural abscess   Discitis of cervical region   Hardware complicating wound infection ([place])   Severe neck pain secondary to acute cervical discitis/osteomyelitis MSSA bacteremia, POA -MRI cervical spine shows acute cervical discitis/osteomyelitis.  Underwent surgical intervention by neurosurgery -Seen by infectious disease-Cont Ancef and Rifampin.  Started 7/5. anticipate 3-4 weeks -Repeat cultures-negative -Advised to use c-collar for cervical neck pain  Polysubstance abuse Tobacco use -Urine drug screen is positive for benzodiazepines, opiates and tetrahydrocannabinol.  Counseled to quit using this. Trazodone at night for sleep.    DVT  prophylaxis: Lovenox Code Status: Full Family Communication: None at bedside.   Disposition Plan: Status is: Inpatient  Remains inpatient appropriate because:Inpatient level of care appropriate due to severity of illness.  Might need inpatient 4 weeks of IV antibiotics   Dispo: The patient is from: Home  Anticipated d/c is to: Undermined  Anticipated d/c date is: > 3 days  Patient currently is not medically stable to d/c.   Body mass index is 21.13 kg/m.    Subjective: Feels okay no complaints besides cervical neck pain  Review of Systems Otherwise negative except as per HPI, including: General: Denies fever, chills, night sweats or unintended weight loss. Resp: Denies cough, wheezing, shortness of breath. Cardiac: Denies chest pain, palpitations, orthopnea, paroxysmal nocturnal dyspnea. GI: Denies abdominal pain, nausea, vomiting, diarrhea or constipation GU: Denies dysuria, frequency, hesitancy or incontinence MS: Denies muscle aches, joint pain or swelling Neuro: Denies headache, neurologic deficits (focal weakness, numbness, tingling), abnormal gait Psych: Denies anxiety, depression, SI/HI/AVH Skin: Denies new rashes or lesions ID: Denies sick contacts, exotic exposures, travel  Examination: Constitutional: Not in acute distress Respiratory: Clear to auscultation bilaterally Cardiovascular: Normal sinus rhythm, no rubs Abdomen: Nontender nondistended good bowel sounds Musculoskeletal: No edema noted Skin: No rashes seen Neurologic: CN 2-12 grossly intact.  And nonfocal Psychiatric: Normal judgment and insight. Alert and oriented x 3. Normal mood. No bleeding around surgical site in her neck  Objective: Vitals:   08/15/19 1954 08/15/19 2315 08/16/19 0342 08/16/19 0816  BP: (!) 152/94 (!) 157/97 (!) 165/98 (!) 158/91  Pulse: 92 89 79 79  Resp: 18 18 18 18   Temp: 99.8 F (37.7 C) 98.4 F (36.9 C) 98.7 F (37.1 C) 98.7 F  (  37.1 C)  TempSrc: Oral Oral Oral Oral  SpO2: 100% 100% 100% 100%  Weight:      Height:        Intake/Output Summary (Last 24 hours) at 08/16/2019 1055 Last data filed at 08/16/2019 0817 Gross per 24 hour  Intake --  Output 1300 ml  Net -1300 ml   Filed Weights   08/07/19 2205 08/08/19 1253 08/08/19 1823  Weight: 59 kg 59 kg 61.2 kg     Data Reviewed:   CBC: Recent Labs  Lab 08/10/19 0704 08/10/19 0704 08/11/19 1000 08/12/19 0418 08/13/19 0418 08/14/19 0452 08/15/19 0528  WBC 13.2*   < > 14.0* 15.0* 17.0* 15.6* 13.2*  NEUTROABS 10.6*  --  11.6* 11.7* 14.1*  --   --   HGB 9.3*   < > 9.5* 10.2* 11.0* 9.7* 10.8*  HCT 28.8*   < > 28.5* 30.2* 31.6* 28.4* 32.5*  MCV 97.6   < > 93.1 91.2 90.0 89.3 90.3  PLT 265   < > 332 421* 549* 533* 644*   < > = values in this interval not displayed.   Basic Metabolic Panel: Recent Labs  Lab 08/11/19 1000 08/12/19 0418 08/13/19 0418 08/14/19 0452 08/15/19 0528  NA 134* 135 133* 133* 135  K 3.3* 3.4* 3.3* 3.5 4.1  CL 99 99 96* 96* 97*  CO2 25 24 26 28 29   GLUCOSE 127* 115* 120* 127* 138*  BUN 5* 6 7 6  <5*  CREATININE 0.47 0.38* 0.44 0.46 0.49  CALCIUM 8.3* 8.3* 8.4* 8.2* 9.0  MG 1.8 1.8 1.9 2.1 2.1   GFR: Estimated Creatinine Clearance: 88.5 mL/min (by C-G formula based on SCr of 0.49 mg/dL). Liver Function Tests: Recent Labs  Lab 08/12/19 0418  AST 15  ALT 16  ALKPHOS 82  BILITOT 0.7  PROT 5.6*  ALBUMIN 2.2*   No results for input(s): LIPASE, AMYLASE in the last 168 hours. No results for input(s): AMMONIA in the last 168 hours. Coagulation Profile: No results for input(s): INR, PROTIME in the last 168 hours. Cardiac Enzymes: No results for input(s): CKTOTAL, CKMB, CKMBINDEX, TROPONINI in the last 168 hours. BNP (last 3 results) No results for input(s): PROBNP in the last 8760 hours. HbA1C: No results for input(s): HGBA1C in the last 72 hours. CBG: No results for input(s): GLUCAP in the last 168  hours. Lipid Profile: No results for input(s): CHOL, HDL, LDLCALC, TRIG, CHOLHDL, LDLDIRECT in the last 72 hours. Thyroid Function Tests: No results for input(s): TSH, T4TOTAL, FREET4, T3FREE, THYROIDAB in the last 72 hours. Anemia Panel: No results for input(s): VITAMINB12, FOLATE, FERRITIN, TIBC, IRON, RETICCTPCT in the last 72 hours. Sepsis Labs: No results for input(s): PROCALCITON, LATICACIDVEN in the last 168 hours.  Recent Results (from the past 240 hour(s))  Culture, blood (Routine X 2) w Reflex to ID Panel     Status: Abnormal   Collection Time: 08/08/19  7:27 AM   Specimen: BLOOD RIGHT FOREARM  Result Value Ref Range Status   Specimen Description BLOOD RIGHT FOREARM  Final   Special Requests   Final    BOTTLES DRAWN AEROBIC AND ANAEROBIC Blood Culture results may not be optimal due to an inadequate volume of blood received in culture bottles   Culture  Setup Time   Final    IN BOTH AEROBIC AND ANAEROBIC BOTTLES GRAM POSITIVE COCCI IN CLUSTERS CRITICAL RESULT CALLED TO, READ BACK BY AND VERIFIED WITHKarsten Ro [facility] 08/09/19 0102 JDW Performed at [facility]  Lab, [address], [place], [place] [address]    Culture STAPHYLOCOCCUS AUREUS (A)  Final   Report Status 08/10/2019 FINAL  Final   Organism ID, Bacteria STAPHYLOCOCCUS AUREUS  Final      Susceptibility   Staphylococcus aureus - MIC*    CIPROFLOXACIN <=0.5 SENSITIVE Sensitive     ERYTHROMYCIN >=8 RESISTANT Resistant     GENTAMICIN <=0.5 SENSITIVE Sensitive     OXACILLIN 0.5 SENSITIVE Sensitive     TETRACYCLINE <=1 SENSITIVE Sensitive     VANCOMYCIN <=0.5 SENSITIVE Sensitive     TRIMETH/SULFA <=10 SENSITIVE Sensitive     CLINDAMYCIN <=0.25 SENSITIVE Sensitive     RIFAMPIN <=0.5 SENSITIVE Sensitive     Inducible Clindamycin NEGATIVE Sensitive     * STAPHYLOCOCCUS AUREUS  Blood Culture ID Panel (Reflexed)     Status: Abnormal   Collection Time: 08/08/19  7:27 AM  Result Value Ref Range Status   Enterococcus  species NOT DETECTED NOT DETECTED Final   Listeria monocytogenes NOT DETECTED NOT DETECTED Final   Staphylococcus species DETECTED (A) NOT DETECTED Final    Comment: CRITICAL RESULT CALLED TO, READ BACK BY AND VERIFIED WITH: J LEDFORD PHARMD 08/09/19 0102 JDW    Staphylococcus aureus (BCID) DETECTED (A) NOT DETECTED Final    Comment: Methicillin (oxacillin) susceptible Staphylococcus aureus (MSSA). Preferred therapy is anti staphylococcal beta lactam antibiotic (Cefazolin or Nafcillin), unless clinically contraindicated. CRITICAL RESULT CALLED TO, READ BACK BY AND VERIFIED WITH: J LEDFORD [facility] 08/09/19 0102 JDW    Methicillin resistance NOT DETECTED NOT DETECTED Final   Streptococcus species NOT DETECTED NOT DETECTED Final   Streptococcus agalactiae NOT DETECTED NOT DETECTED Final   Streptococcus pneumoniae NOT DETECTED NOT DETECTED Final   Streptococcus pyogenes NOT DETECTED NOT DETECTED Final   Acinetobacter baumannii NOT DETECTED NOT DETECTED Final   Enterobacteriaceae species NOT DETECTED NOT DETECTED Final   Enterobacter cloacae complex NOT DETECTED NOT DETECTED Final   Escherichia coli NOT DETECTED NOT DETECTED Final   Klebsiella oxytoca NOT DETECTED NOT DETECTED Final   Klebsiella pneumoniae NOT DETECTED NOT DETECTED Final   Proteus species NOT DETECTED NOT DETECTED Final   Serratia marcescens NOT DETECTED NOT DETECTED Final   Haemophilus influenzae NOT DETECTED NOT DETECTED Final   Neisseria meningitidis NOT DETECTED NOT DETECTED Final   Pseudomonas aeruginosa NOT DETECTED NOT DETECTED Final   Candida albicans NOT DETECTED NOT DETECTED Final   Candida glabrata NOT DETECTED NOT DETECTED Final   Candida krusei NOT DETECTED NOT DETECTED Final   Candida parapsilosis NOT DETECTED NOT DETECTED Final   Candida tropicalis NOT DETECTED NOT DETECTED Final    Comment: Performed at [facility], [place]. [address], [place], [place] [address]  SARS Coronavirus 2 by RT PCR (hospital order,  performed in [facility]) Nasopharyngeal Nasopharyngeal Swab     Status: None   Collection Time: 08/08/19  9:02 AM   Specimen: Nasopharyngeal Swab  Result Value Ref Range Status   SARS Coronavirus 2 NEGATIVE NEGATIVE Final    Comment: (NOTE) SARS-CoV-2 target nucleic acids are NOT DETECTED.  The SARS-CoV-2 RNA is generally detectable in upper and lower respiratory specimens during the acute phase of infection. The lowest concentration of SARS-CoV-2 viral copies this assay can detect is 250 copies / mL. A negative result does not preclude SARS-CoV-2 infection and should not be used as the sole basis for treatment or other patient management decisions.  A negative result may occur with improper specimen collection / handling,  submission of specimen other than nasopharyngeal swab, presence of viral mutation(s) within the areas targeted by this assay, and inadequate number of viral copies (<250 copies / mL). A negative result must be combined with clinical observations, patient history, and epidemiological information.  Fact Sheet for Patients:   StrictlyIdeas.no  Fact Sheet for Healthcare Providers: BankingDealers.co.za  This test is not yet approved or  cleared by the [place] FDA and has been authorized for detection and/or diagnosis of SARS-CoV-2 by FDA under an Emergency Use Authorization (EUA).  This EUA will remain in effect (meaning this test can be used) for the duration of the COVID-19 declaration under Section 564(b)(1) of the Act, 21 U.S.C. section 360bbb-3(b)(1), unless the authorization is terminated or revoked sooner.  Performed at [facility], [place] [address], [place], [place] [address]   Culture, blood (Routine X 2) w Reflex to ID Panel     Status: Abnormal   Collection Time: 08/08/19  5:11 PM   Specimen: BLOOD RIGHT HAND  Result Value Ref Range Status   Specimen Description BLOOD RIGHT HAND   Final   Special Requests   Final    BOTTLES DRAWN AEROBIC AND ANAEROBIC Blood Culture adequate volume   Culture  Setup Time   Final    GRAM POSITIVE COCCI IN CLUSTERS IN BOTH AEROBIC AND ANAEROBIC BOTTLES CRITICAL RESULT CALLED TO, READ BACK BY AND VERIFIED WITH: J. LEDFORD,PHARMD 0503 08/09/2019 T. TYSOR    Culture (A)  Final    STAPHYLOCOCCUS AUREUS SUSCEPTIBILITIES PERFORMED ON PREVIOUS CULTURE WITHIN THE LAST 5 DAYS. Performed at [facility], [place] [address], [place], [place] [address]    Report Status 08/11/2019 FINAL  Final  Culture, blood (routine x 2)     Status: None   Collection Time: 08/10/19  7:04 AM   Specimen: BLOOD  Result Value Ref Range Status   Specimen Description BLOOD RIGHT ANTECUBITAL  Final   Special Requests   Final    BOTTLES DRAWN AEROBIC ONLY Blood Culture results may not be optimal due to an inadequate volume of blood received in culture bottles   Culture   Final    NO GROWTH 5 DAYS Performed at [facility], [place] [address], [place], [place] [address]    Report Status 08/15/2019 FINAL  Final  Culture, blood (routine x 2)     Status: None   Collection Time: 08/10/19  7:04 AM   Specimen: BLOOD RIGHT HAND  Result Value Ref Range Status   Specimen Description BLOOD RIGHT HAND  Final   Special Requests   Final    BOTTLES DRAWN AEROBIC ONLY Blood Culture adequate volume   Culture   Final    NO GROWTH 5 DAYS Performed at [facility], [place] [address], [place], [place] [address]    Report Status 08/15/2019 FINAL  Final         Radiology Studies: No results found.      Scheduled Meds: . amLODipine  5 mg Oral Daily  . Chlorhexidine Gluconate Cloth  6 each Topical Daily  . enoxaparin (LOVENOX) injection  40 mg Subcutaneous Q24H  . folic acid  1 mg Oral Daily  . multivitamin with minerals  1 tablet Oral Daily  . nicotine  14 mg Transdermal Daily  . rifampin  300 mg Oral BID WC  . senna-docusate  1 tablet Oral BID  . sodium  chloride flush  10-40 mL Intracatheter Q12H  . thiamine  100 mg  Oral Daily   Continuous Infusions: .  ceFAZolin (ANCEF) IV 2 g (08/16/19 0553)     LOS: 8 days   Time spent= 35 mins    Alyshia Kernan Arsenio Loader, MD [facility]  If 7PM-7AM, please contact night-coverage  08/16/2019, 10:55 AM

## 2019-08-17 LAB — BASIC METABOLIC PANEL
Anion gap: 11 (ref 5–15)
BUN: 8 mg/dL (ref 6–20)
CO2: 28 mmol/L (ref 22–32)
Calcium: 9 mg/dL (ref 8.9–10.3)
Chloride: 100 mmol/L (ref 98–111)
Creatinine, Ser: 0.45 mg/dL (ref 0.44–1.00)
GFR calc Af Amer: >60 mL/min (ref >60)
GFR calc non Af Amer: >60 mL/min (ref >60)
Glucose, Bld: 121 mg/dL — ABNORMAL HIGH (ref 70–99)
Potassium: 3.8 mmol/L (ref 3.5–5.1)
Sodium: 139 mmol/L (ref 135–145)

## 2019-08-17 LAB — CBC
HCT: 33.1 % — ABNORMAL LOW (ref 36.0–46.0)
Hemoglobin: 10.8 g/dL — ABNORMAL LOW (ref 12.0–15.0)
MCH: 29.9 pg (ref 26.0–34.0)
MCHC: 32.6 g/dL (ref 30.0–36.0)
MCV: 91.7 fL (ref 80.0–100.0)
Platelets: 800 10*3/uL — ABNORMAL HIGH (ref 150–400)
RBC: 3.61 MIL/uL — ABNORMAL LOW (ref 3.87–5.11)
RDW: 12.6 % (ref 11.5–15.5)
WBC: 13.4 10*3/uL — ABNORMAL HIGH (ref 4.0–10.5)
nRBC: 0 % (ref 0.0–0.2)

## 2019-08-17 LAB — MAGNESIUM: Magnesium: 2.1 mg/dL (ref 1.7–2.4)

## 2019-08-17 NOTE — Progress Notes (Signed)
Orthopedic Tech Progress Note Patient Details:  Monica Eaton 1975-08-07 [id number]  Ortho Devices Type of Ortho Device: Soft collar Ortho Device/Splint Interventions: Ordered   Post Interventions Instructions Provided: Adjustment of device, Care of device, Poper ambulation with device Delivered soft collar to patient to be applied by nurse tech.  Tammy Sours 08/17/2019, 2:02 PM

## 2019-08-17 NOTE — Progress Notes (Signed)
Physical Therapy Treatment Patient Details Name: Monica Eaton MRN: [id number] DOB: [birth year] Today's Date: 08/17/2019    History of Present Illness Opel Lejeune is a 44 y.o. female with medical history significant for tobacco abuse and IV drug abuse who presents to the emergency department due to sudden onset of upper back pain which started about 3 -4 hours PTA.  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 Pt underwent, s/p C3-6 OLaminectomy & fusion.     PT Comments    Pt progressing slowly towards physical therapy goals. She was able to tolerate ambulation to bathroom to void, washing hands at the sink, and return to the chair without reports of increased pain. Continues to be very anxious throughout mobility and this seems to be her main limiting factor. Noted CIR signed off, and recommendations updated accordingly. Will continue to follow and progress as able per POC.    Follow Up Recommendations  SNF;Supervision/Assistance - 24 hour     Equipment Recommendations  Rolling walker with 5" wheels    Recommendations for Other Services       Precautions / Restrictions Precautions Precautions: Cervical;Fall Precaution Booklet Issued: Yes (comment) Precaution Comments: Reviewed precautions during functional mobility. Pt slid off BSC with nursing staff present on 7/7 Required Braces or Orthoses:  (per neurosurgery, no collar/brace indicated) Restrictions Weight Bearing Restrictions: No    Mobility  Bed Mobility Overal bed mobility: Needs Assistance Bed Mobility: Rolling;Sidelying to Sit Rolling: Supervision Sidelying to sit: Min assist       General bed mobility comments: Increased time. Assist required for trunk elevation to full sitting position.   Transfers Overall transfer level: Needs assistance Equipment used: Rolling walker (2 wheeled) Transfers: Sit to/from Stand Sit to Stand: Min assist;+2  safety/equipment         General transfer comment: VC's for hand placement on seated surface for safety. Able to power-up to full stand with min assist. +2 mainly due to anxiety.  Ambulation/Gait Ambulation/Gait assistance: Min assist Gait Distance (Feet): 20 Feet Assistive device: Rolling walker (2 wheeled) Gait Pattern/deviations: Step-through pattern;Decreased stride length;Narrow base of support;Trunk flexed Gait velocity: decreased Gait velocity interpretation: <1.31 ft/sec, indicative of household ambulator General Gait Details: VC's for improved posture and for safety. Pt required occasional min assist for balance support and safety as she occasionally demonstrated a posterior lean and got very anxious, fearful of falling. Ambulated in room only to bathroom, washed hands at the sink, and then ambulated back to the recliner chair.    Stairs             Wheelchair Mobility    Modified Rankin (Stroke Patients Only)       Balance Overall balance assessment: Needs assistance Sitting-balance support: Feet supported Sitting balance-Leahy Scale: Poor Sitting balance - Comments: mod cuing to put feet on the floor 2 posterior lean. Able to correct with cuing    Standing balance support: Bilateral upper extremity supported Standing balance-Leahy Scale: Poor Standing balance comment: dependent on walker and occasional physical assist                            Cognition Arousal/Alertness: Awake/alert Behavior During Therapy: Anxious Overall Cognitive Status: Within Functional Limits for tasks assessed                                 General Comments:  very anxious with basic movements       Exercises      General Comments        Pertinent Vitals/Pain Pain Assessment: Faces Faces Pain Scale: Hurts little more Pain Location: Neck, shoulders Pain Descriptors / Indicators: Grimacing;Operative site guarding;Sore Pain Intervention(s):  Limited activity within patient's tolerance;Monitored during session;Repositioned    Home Living                      Prior Function            PT Goals (current goals can now be found in the care plan section) Acute Rehab PT Goals Patient Stated Goal: Feel better PT Goal Formulation: With patient Time For Goal Achievement: 08/27/19 Potential to Achieve Goals: Fair Progress towards PT goals: Progressing toward goals    Frequency    Min 5X/week      PT Plan Current plan remains appropriate    Co-evaluation              AM-PAC PT "6 Clicks" Mobility   Outcome Measure  Help needed turning from your back to your side while in a flat bed without using bedrails?: A Little Help needed moving from lying on your back to sitting on the side of a flat bed without using bedrails?: A Lot Help needed moving to and from a bed to a chair (including a wheelchair)?: A Lot Help needed standing up from a chair using your arms (e.g., wheelchair or bedside chair)?: A Lot Help needed to walk in hospital room?: A Lot Help needed climbing 3-5 steps with a railing? : Total 6 Click Score: 12    End of Session Equipment Utilized During Treatment: Gait belt Activity Tolerance: Patient tolerated treatment well Patient left: in chair;with call bell/phone within reach;with chair alarm set Nurse Communication: Mobility status PT Visit Diagnosis: Unsteadiness on feet (R26.81);Difficulty in walking, not elsewhere classified (R26.2)     Time: 1000-1019 PT Time Calculation (min) (ACUTE ONLY): 19 min  Charges:  $Gait Training: 8-22 mins                     Rolinda Roan, PT, DPT Acute Rehabilitation Services Pager: [phone] Office: [phone]    Thelma Comp 08/17/2019, 1:13 PM

## 2019-08-17 NOTE — Progress Notes (Signed)
PROGRESS NOTE    Monica Eaton  QIH:[id number] DOB: Jun 19, 1975 DOA: 08/07/2019 PCP: Patient, No Pcp Per   Brief Narrative:  44 year old female with history of tobacco abuse and IV drug abuse presented with severe neck pain and upper back pain.  In the ED, she had leukocytosis with CRP of 9.4.  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 C2-3 nonsegmentation and 3 mm of anterolisthesis at C3-4 was also noted. An attempt for MRI cervical spine with contrast was unsuccessful due to patient not being able to lay still despite IV Dilaudid, Ativan and Valium.  She was subsequently transferred to [facility] for MRI under general anesthesia.  She was started on broad-spectrum IV antibiotics.  Neurosurgery was consulted.  After MRI of spine under general anesthesia, patient underwent surgical intervention by neurosurgery on 08/08/2019. She was subsequently found to have MSSA bacteremia and ID was consulted.    Assessment & Plan:   Principal Problem:   Cervical pain (neck) Active Problems:   IVDU (intravenous drug user)   Tobacco abuse   Neck pain   Bandemia   MSSA bacteremia   Epidural abscess   Discitis of cervical region   Hardware complicating wound infection ([place])   Severe neck pain secondary to acute cervical discitis/osteomyelitis MSSA bacteremia, POA -MRI cervical spine shows acute cervical discitis/osteomyelitis.  Underwent surgical intervention by neurosurgery -Seen by infectious disease-Cont Ancef and Rifampin.  Started 7/5. anticipate 3-4 weeks -Repeat cultures-negative -Advised to wear his c-collar for cervical neck pain, c-collar ordered  Polysubstance abuse Tobacco use -Urine drug screen is positive for benzodiazepines, opiates and tetrahydrocannabinol.  Counseled to quit using this. Trazodone at night for sleep.     DVT prophylaxis: Lovenox Code Status: Full Family Communication: None at bedside.   Disposition Plan: Status is: Inpatient  Remains inpatient appropriate because:Inpatient level of care appropriate due to severity of illness.  Might need inpatient 4 weeks of IV antibiotics   Dispo: The patient is from: Home  Anticipated d/c is to: Undermined  Anticipated d/c date is: > 3 days  Patient currently is not medically stable to d/c.   Body mass index is 21.13 kg/m.    Subjective: Still has cervical neck pain but no other new complaints.  Remains afebrile.  Review of Systems Otherwise negative except as per HPI, including: General: Denies fever, chills, night sweats or unintended weight loss. Resp: Denies cough, wheezing, shortness of breath. Cardiac: Denies chest pain, palpitations, orthopnea, paroxysmal nocturnal dyspnea. GI: Denies abdominal pain, nausea, vomiting, diarrhea or constipation GU: Denies dysuria, frequency, hesitancy or incontinence MS: Denies muscle aches, joint pain or swelling Neuro: Denies headache, neurologic deficits (focal weakness, numbness, tingling), abnormal gait Psych: Denies anxiety, depression, SI/HI/AVH Skin: Denies new rashes or lesions ID: Denies sick contacts, exotic exposures, travel  Examination: Constitutional: Not in acute distress Respiratory: Clear to auscultation bilaterally Cardiovascular: Normal sinus rhythm, no rubs Abdomen: Nontender nondistended good bowel sounds Musculoskeletal: No edema noted Skin: Surgical site in her C-spine area noted Neurologic: CN 2-12 grossly intact.  And nonfocal Psychiatric: Normal judgment and insight. Alert and oriented x 3. Normal mood.  Objective: Vitals:   08/16/19 2111 08/17/19 0024 08/17/19 0338 08/17/19 0843  BP: (!) 154/95 (!) 171/97 (!) 146/90 (!) 155/90  Pulse: 87 87 93 91  Resp: 18 16 16 18   Temp: 98.5 F (36.9 C) 98.5 F (36.9 C) 98.5 F  (  36.9 C) 97.9 F (36.6 C)  TempSrc: Oral Oral Oral Oral  SpO2: 99% 100% 100% 99%  Weight:      Height:        Intake/Output Summary (Last 24 hours) at 08/17/2019 1042 Last data filed at 08/17/2019 0300 Gross per 24 hour  Intake --  Output 750 ml  Net -750 ml   Filed Weights   08/07/19 2205 08/08/19 1253 08/08/19 1823  Weight: 59 kg 59 kg 61.2 kg     Data Reviewed:   CBC: Recent Labs  Lab 08/11/19 1000 08/11/19 1000 08/12/19 0418 08/12/19 0418 08/13/19 0418 08/14/19 0452 08/15/19 0528 08/16/19 1426 08/17/19 0446  WBC 14.0*   < > 15.0*   < > 17.0* 15.6* 13.2* 14.5* 13.4*  NEUTROABS 11.6*  --  11.7*  --  14.1*  --   --   --   --   HGB 9.5*   < > 10.2*   < > 11.0* 9.7* 10.8* 11.1* 10.8*  HCT 28.5*   < > 30.2*   < > 31.6* 28.4* 32.5* 33.3* 33.1*  MCV 93.1   < > 91.2   < > 90.0 89.3 90.3 91.2 91.7  PLT 332   < > 421*   < > 549* 533* 644* 780* 800*   < > = values in this interval not displayed.   Basic Metabolic Panel: Recent Labs  Lab 08/13/19 0418 08/14/19 0452 08/15/19 0528 08/16/19 1426 08/17/19 0446  NA 133* 133* 135 136 139  K 3.3* 3.5 4.1 3.9 3.8  CL 96* 96* 97* 99 100  CO2 26 28 29 27 28   GLUCOSE 120* 127* 138* 141* 121*  BUN 7 6 <5* 7 8  CREATININE 0.44 0.46 0.49 0.45 0.45  CALCIUM 8.4* 8.2* 9.0 8.9 9.0  MG 1.9 2.1 2.1 2.1 2.1   GFR: Estimated Creatinine Clearance: 88.5 mL/min (by C-G formula based on SCr of 0.45 mg/dL). Liver Function Tests: Recent Labs  Lab 08/12/19 0418  AST 15  ALT 16  ALKPHOS 82  BILITOT 0.7  PROT 5.6*  ALBUMIN 2.2*   No results for input(s): LIPASE, AMYLASE in the last 168 hours. No results for input(s): AMMONIA in the last 168 hours. Coagulation Profile: No results for input(s): INR, PROTIME in the last 168 hours. Cardiac Enzymes: No results for input(s): CKTOTAL, CKMB, CKMBINDEX, TROPONINI in the last 168 hours. BNP (last 3 results) No results for input(s): PROBNP in the last 8760 hours. HbA1C: No results for  input(s): HGBA1C in the last 72 hours. CBG: No results for input(s): GLUCAP in the last 168 hours. Lipid Profile: No results for input(s): CHOL, HDL, LDLCALC, TRIG, CHOLHDL, LDLDIRECT in the last 72 hours. Thyroid Function Tests: No results for input(s): TSH, T4TOTAL, FREET4, T3FREE, THYROIDAB in the last 72 hours. Anemia Panel: No results for input(s): VITAMINB12, FOLATE, FERRITIN, TIBC, IRON, RETICCTPCT in the last 72 hours. Sepsis Labs: No results for input(s): PROCALCITON, LATICACIDVEN in the last 168 hours.  Recent Results (from the past 240 hour(s))  Culture, blood (Routine X 2) w Reflex to ID Panel     Status: Abnormal   Collection Time: 08/08/19  7:27 AM   Specimen: BLOOD RIGHT FOREARM  Result Value Ref Range Status   Specimen Description BLOOD RIGHT FOREARM  Final   Special Requests   Final    BOTTLES DRAWN AEROBIC AND ANAEROBIC Blood Culture results may not be optimal due to an inadequate volume of blood received in culture bottles   Culture  Setup Time   Final    IN BOTH AEROBIC AND ANAEROBIC BOTTLES GRAM POSITIVE COCCI IN CLUSTERS CRITICAL RESULT CALLED TO, READ BACK BY AND VERIFIED WITH: Karsten Ro [facility] 08/09/19 0102 JDW Performed at [facility], [address], [place], [place] [address]    Culture STAPHYLOCOCCUS AUREUS (A)  Final   Report Status 08/10/2019 FINAL  Final   Organism ID, Bacteria STAPHYLOCOCCUS AUREUS  Final      Susceptibility   Staphylococcus aureus - MIC*    CIPROFLOXACIN <=0.5 SENSITIVE Sensitive     ERYTHROMYCIN >=8 RESISTANT Resistant     GENTAMICIN <=0.5 SENSITIVE Sensitive     OXACILLIN 0.5 SENSITIVE Sensitive     TETRACYCLINE <=1 SENSITIVE Sensitive     VANCOMYCIN <=0.5 SENSITIVE Sensitive     TRIMETH/SULFA <=10 SENSITIVE Sensitive     CLINDAMYCIN <=0.25 SENSITIVE Sensitive     RIFAMPIN <=0.5 SENSITIVE Sensitive     Inducible Clindamycin NEGATIVE Sensitive     * STAPHYLOCOCCUS AUREUS  Blood Culture ID Panel (Reflexed)     Status:  Abnormal   Collection Time: 08/08/19  7:27 AM  Result Value Ref Range Status   Enterococcus species NOT DETECTED NOT DETECTED Final   Listeria monocytogenes NOT DETECTED NOT DETECTED Final   Staphylococcus species DETECTED (A) NOT DETECTED Final    Comment: CRITICAL RESULT CALLED TO, READ BACK BY AND VERIFIED WITH: J LEDFORD PHARMD 08/09/19 0102 JDW    Staphylococcus aureus (BCID) DETECTED (A) NOT DETECTED Final    Comment: Methicillin (oxacillin) susceptible Staphylococcus aureus (MSSA). Preferred therapy is anti staphylococcal beta lactam antibiotic (Cefazolin or Nafcillin), unless clinically contraindicated. CRITICAL RESULT CALLED TO, READ BACK BY AND VERIFIED WITH: J LEDFORD [facility] 08/09/19 0102 JDW    Methicillin resistance NOT DETECTED NOT DETECTED Final   Streptococcus species NOT DETECTED NOT DETECTED Final   Streptococcus agalactiae NOT DETECTED NOT DETECTED Final   Streptococcus pneumoniae NOT DETECTED NOT DETECTED Final   Streptococcus pyogenes NOT DETECTED NOT DETECTED Final   Acinetobacter baumannii NOT DETECTED NOT DETECTED Final   Enterobacteriaceae species NOT DETECTED NOT DETECTED Final   Enterobacter cloacae complex NOT DETECTED NOT DETECTED Final   Escherichia coli NOT DETECTED NOT DETECTED Final   Klebsiella oxytoca NOT DETECTED NOT DETECTED Final   Klebsiella pneumoniae NOT DETECTED NOT DETECTED Final   Proteus species NOT DETECTED NOT DETECTED Final   Serratia marcescens NOT DETECTED NOT DETECTED Final   Haemophilus influenzae NOT DETECTED NOT DETECTED Final   Neisseria meningitidis NOT DETECTED NOT DETECTED Final   Pseudomonas aeruginosa NOT DETECTED NOT DETECTED Final   Candida albicans NOT DETECTED NOT DETECTED Final   Candida glabrata NOT DETECTED NOT DETECTED Final   Candida krusei NOT DETECTED NOT DETECTED Final   Candida parapsilosis NOT DETECTED NOT DETECTED Final   Candida tropicalis NOT DETECTED NOT DETECTED Final    Comment: Performed at [facility], [place]. [address] [place], [place] [address]  SARS Coronavirus 2 by RT PCR (hospital order, performed in [facility] hospital lab) Nasopharyngeal Nasopharyngeal Swab     Status: None   Collection Time: 08/08/19  9:02 AM   Specimen: Nasopharyngeal Swab  Result Value Ref Range Status   SARS Coronavirus 2 NEGATIVE NEGATIVE Final    Comment: (NOTE) SARS-CoV-2 target nucleic acids are NOT DETECTED.  The SARS-CoV-2 RNA is generally detectable in upper and lower respiratory specimens during the acute phase of infection. The lowest concentration of SARS-CoV-2 viral copies this assay can detect is  250 copies / mL. A negative result does not preclude SARS-CoV-2 infection and should not be used as the sole basis for treatment or other patient management decisions.  A negative result may occur with improper specimen collection / handling, submission of specimen other than nasopharyngeal swab, presence of viral mutation(s) within the areas targeted by this assay, and inadequate number of viral copies (<250 copies / mL). A negative result must be combined with clinical observations, patient history, and epidemiological information.  Fact Sheet for Patients:   StrictlyIdeas.no  Fact Sheet for Healthcare Providers: BankingDealers.co.za  This test is not yet approved or  cleared by the [place] FDA and has been authorized for detection and/or diagnosis of SARS-CoV-2 by FDA under an Emergency Use Authorization (EUA).  This EUA will remain in effect (meaning this test can be used) for the duration of the COVID-19 declaration under Section 564(b)(1) of the Act, 21 U.S.C. section 360bbb-3(b)(1), unless the authorization is terminated or revoked sooner.  Performed at [facility] [facility], [place] [address], [place], [place] [address]   Culture, blood (Routine X 2) w Reflex to ID Panel     Status: Abnormal   Collection Time: 08/08/19  5:11 PM    Specimen: BLOOD RIGHT HAND  Result Value Ref Range Status   Specimen Description BLOOD RIGHT HAND  Final   Special Requests   Final    BOTTLES DRAWN AEROBIC AND ANAEROBIC Blood Culture adequate volume   Culture  Setup Time   Final    GRAM POSITIVE COCCI IN CLUSTERS IN BOTH AEROBIC AND ANAEROBIC BOTTLES CRITICAL RESULT CALLED TO, READ BACK BY AND VERIFIED WITH: J. LEDFORD,PHARMD 0503 08/09/2019 T. TYSOR    Culture (A)  Final    STAPHYLOCOCCUS AUREUS SUSCEPTIBILITIES PERFORMED ON PREVIOUS CULTURE WITHIN THE LAST 5 DAYS. Performed at [facility], [place] [address], [place], [place] [address]    Report Status 08/11/2019 FINAL  Final  Culture, blood (routine x 2)     Status: None   Collection Time: 08/10/19  7:04 AM   Specimen: BLOOD  Result Value Ref Range Status   Specimen Description BLOOD RIGHT ANTECUBITAL  Final   Special Requests   Final    BOTTLES DRAWN AEROBIC ONLY Blood Culture results may not be optimal due to an inadequate volume of blood received in culture bottles   Culture   Final    NO GROWTH 5 DAYS Performed at [facility], [place] [address], [place], [place] [address]    Report Status 08/15/2019 FINAL  Final  Culture, blood (routine x 2)     Status: None   Collection Time: 08/10/19  7:04 AM   Specimen: BLOOD RIGHT HAND  Result Value Ref Range Status   Specimen Description BLOOD RIGHT HAND  Final   Special Requests   Final    BOTTLES DRAWN AEROBIC ONLY Blood Culture adequate volume   Culture   Final    NO GROWTH 5 DAYS Performed at [facility], [place] [address], [place], [place] [address]    Report Status 08/15/2019 FINAL  Final         Radiology Studies: No results found.      Scheduled Meds: . amLODipine  5 mg Oral Daily  . Chlorhexidine Gluconate Cloth  6 each Topical Daily  . enoxaparin (LOVENOX) injection  40 mg Subcutaneous Q24H  . folic acid  1 mg Oral Daily  . multivitamin with minerals  1 tablet Oral Daily  .  nicotine  14 mg  Transdermal Daily  . rifampin  300 mg Oral BID WC  . senna-docusate  1 tablet Oral BID  . sodium chloride flush  10-40 mL Intracatheter Q12H  . thiamine  100 mg Oral Daily   Continuous Infusions: .  ceFAZolin (ANCEF) IV 2 g (08/17/19 0511)     LOS: 9 days   Time spent= 35 mins    [place] Shannahan Arsenio Loader, MD [facility]  If 7PM-7AM, please contact night-coverage  08/17/2019, 10:42 AM

## 2019-08-17 NOTE — Progress Notes (Signed)
Subjective: Patient continues to report moderate posterior cervical neck pain. Patient is continuing to work with PT and is making progress. No acute events overnight were reported.   Objective: Vital signs in last 24 hours: Temp:  [97.9 F (36.6 C)-99 F (37.2 C)] 97.9 F (36.6 C) (07/11 0843) Pulse Rate:  [87-93] 91 (07/11 0843) Resp:  [16-18] 18 (07/11 0843) BP: (146-171)/(90-97) 155/90 (07/11 0843) SpO2:  [98 %-100 %] 99 % (07/11 0843)  Intake/Output from previous day: 07/10 0701 - 07/11 0700 In: -  Out: 1050 [Urine:1050] Intake/Output this shift: No intake/output data recorded.  Physical Exam: Strength diffusely 4/5, diffusely decreased sensation x4, +R hoffman's, reflexes 2+ with ankle clonus incision clean / dry / intact  Lab Results: Recent Labs    08/16/19 1426 08/17/19 0446  WBC 14.5* 13.4*  HGB 11.1* 10.8*  HCT 33.3* 33.1*  PLT 780* 800*   BMET Recent Labs    08/16/19 1426 08/17/19 0446  NA 136 139  K 3.9 3.8  CL 99 100  CO2 27 28  GLUCOSE 141* 121*  BUN 7 8  CREATININE 0.45 0.45  CALCIUM 8.9 9.0    Studies/Results: No results found.  Assessment/Plan: The patient is s/p C3-7 lami C3-6 PSIF, recovering well. No collar needed/ mandatory, but can wear a soft or hard collar for comfort. Continue working with therapies.     LOS: 9 days    Peggyann Shoals, MD 08/17/2019, 9:29 AM

## 2019-08-18 LAB — CBC
HCT: 32.2 % — ABNORMAL LOW (ref 36.0–46.0)
Hemoglobin: 10.4 g/dL — ABNORMAL LOW (ref 12.0–15.0)
MCH: 30.3 pg (ref 26.0–34.0)
MCHC: 32.3 g/dL (ref 30.0–36.0)
MCV: 93.9 fL (ref 80.0–100.0)
Platelets: 849 10*3/uL — ABNORMAL HIGH (ref 150–400)
RBC: 3.43 MIL/uL — ABNORMAL LOW (ref 3.87–5.11)
RDW: 12.8 % (ref 11.5–15.5)
WBC: 13.3 10*3/uL — ABNORMAL HIGH (ref 4.0–10.5)
nRBC: 0 % (ref 0.0–0.2)

## 2019-08-18 LAB — BASIC METABOLIC PANEL
Anion gap: 12 (ref 5–15)
BUN: 8 mg/dL (ref 6–20)
CO2: 26 mmol/L (ref 22–32)
Calcium: 8.8 mg/dL — ABNORMAL LOW (ref 8.9–10.3)
Chloride: 100 mmol/L (ref 98–111)
Creatinine, Ser: 0.49 mg/dL (ref 0.44–1.00)
GFR calc Af Amer: >60 mL/min (ref >60)
GFR calc non Af Amer: >60 mL/min (ref >60)
Glucose, Bld: 131 mg/dL — ABNORMAL HIGH (ref 70–99)
Potassium: 3.8 mmol/L (ref 3.5–5.1)
Sodium: 138 mmol/L (ref 135–145)

## 2019-08-18 LAB — MAGNESIUM: Magnesium: 2.2 mg/dL (ref 1.7–2.4)

## 2019-08-18 MED ORDER — PROSOURCE PLUS PO LIQD
30.0000 mL | Freq: Two times a day (BID) | ORAL | Status: DC
  Administered 2019-08-18 – 2019-08-25 (×3): 30 mL via ORAL
  Filled 2019-08-18 (×15): qty 30

## 2019-08-18 MED ORDER — BOOST / RESOURCE BREEZE PO LIQD CUSTOM
1.0000 | Freq: Three times a day (TID) | ORAL | Status: DC
  Administered 2019-08-18 – 2019-09-07 (×30): 1 via ORAL
  Filled 2019-08-18 (×2): qty 1

## 2019-08-18 NOTE — Progress Notes (Signed)
PROGRESS NOTE    Monica Eaton  ZOX:[id number] DOB: 14-Jun-1975 DOA: 08/07/2019 PCP: Patient, No Pcp Per   Brief Narrative:  44 year old female with history of tobacco abuse and IV drug abuse presented with severe neck pain and upper back pain.  In the ED, she had leukocytosis with CRP of 9.4.  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  C2-3 nonsegmentation and 3 mm of anterolisthesis at C3-4 was also noted.  An attempt for MRI cervical spine with contrast was unsuccessful due to patient not being able to lay still despite IV Dilaudid, Ativan and Valium.  She was subsequently transferred to [facility] for MRI under general anesthesia.  She was started on broad-spectrum IV antibiotics.  Neurosurgery was consulted.  After MRI of spine under general anesthesia, patient underwent surgical intervention by neurosurgery on 08/08/2019. She was subsequently found to have MSSA bacteremia and ID was consulted.  Plans for total of about 4 weeks of antibiotics-Ancef and rifampin.     Assessment & Plan:   Principal Problem:   Cervical pain (neck) Active Problems:   IVDU (intravenous drug user)   Tobacco abuse   Neck pain   Bandemia   MSSA bacteremia   Epidural abscess   Discitis of cervical region   Hardware complicating wound infection ([place])   Severe neck pain secondary to acute cervical discitis/osteomyelitis MSSA bacteremia, POA -MRI cervical spine shows acute cervical discitis/osteomyelitis.  Underwent surgical intervention by neurosurgery -Seen by infectious disease-Cont Ancef and Rifampin.  Started 7/5. anticipate 4weeks -Repeat cultures-negative -Advised to wear c-collar for comfort  Polysubstance abuse Tobacco use -Urine drug screen is positive for benzodiazepines, opiates and tetrahydrocannabinol.  Counseled to quit using  this. Trazodone at night for sleep.   Patient and father requesting letter for court date extension, letter written under the letter section, nursing staff requested to printed out and given to the family when they arrive.     DVT prophylaxis: Lovenox Code Status: Full Family Communication: Spoke with patient's father over the phone Disposition Plan: Status is: Inpatient   Remains inpatient appropriate because:Inpatient level of care appropriate due to severity of illness.  Might need inpatient 4 weeks of IV antibiotics     Dispo: The patient is from: Home              Anticipated d/c is to: Undermined              Anticipated d/c date is: > 3 days              Patient currently is not medically stable to d/c.  Maintain hospital stay for IV antibiotics, anticipate total 4 weeks of antibiotics.  Started on 7/5   Body mass index is 21.13 kg/m.    Subjective: No new complaints besides neck discomfort at the surgical site.  I explained her to wear c-collar for comfort.  Review of Systems Otherwise negative except as per HPI, including: General: Denies fever, chills, night sweats or unintended weight loss. Resp: Denies cough, wheezing, shortness of breath. Cardiac: Denies chest pain, palpitations, orthopnea, paroxysmal nocturnal dyspnea. GI: Denies abdominal pain, nausea, vomiting, diarrhea or constipation GU: Denies dysuria, frequency, hesitancy or incontinence MS: Denies muscle aches, joint pain or swelling Neuro: Denies headache, neurologic deficits (focal weakness, numbness, tingling), abnormal gait Psych: Denies anxiety, depression, SI/HI/AVH Skin: Denies new rashes or lesions ID: Denies sick contacts, exotic  exposures, travel  Examination: Constitutional: Not in acute distress Respiratory: Clear to auscultation bilaterally Cardiovascular: Normal sinus rhythm, no rubs Abdomen: Nontender nondistended good bowel sounds Musculoskeletal: No edema noted Skin: Surgical site  in her C-spine area noted Neurologic: CN 2-12 grossly intact.  And nonfocal Psychiatric: Normal judgment and insight. Alert and oriented x 3. Normal mood.  Objective: Vitals:   08/18/19 0000 08/18/19 0418 08/18/19 0757 08/18/19 0800  BP: 128/79 140/89 (!) 141/97 (!) 141/97  Pulse: 98 98 (!) 103 (!) 103  Resp: 16 16 16    Temp: 98.5 F (36.9 C) 98.7 F (37.1 C) 99.3 F (37.4 C)   TempSrc: Oral Oral Oral   SpO2: 100% 100% 100%   Weight:      Height:        Intake/Output Summary (Last 24 hours) at 08/18/2019 0951 Last data filed at 08/18/2019 3267 Gross per 24 hour  Intake 700.06 ml  Output 300 ml  Net 400.06 ml   Filed Weights   08/07/19 2205 08/08/19 1253 08/08/19 1823  Weight: 59 kg 59 kg 61.2 kg     Data Reviewed:   CBC: Recent Labs  Lab 08/11/19 1000 08/11/19 1000 08/12/19 0418 08/12/19 0418 08/13/19 0418 08/14/19 0452 08/15/19 0528 08/16/19 1426 08/17/19 0446  WBC 14.0*   < > 15.0*   < > 17.0* 15.6* 13.2* 14.5* 13.4*  NEUTROABS 11.6*  --  11.7*  --  14.1*  --   --   --   --   HGB 9.5*   < > 10.2*   < > 11.0* 9.7* 10.8* 11.1* 10.8*  HCT 28.5*   < > 30.2*   < > 31.6* 28.4* 32.5* 33.3* 33.1*  MCV 93.1   < > 91.2   < > 90.0 89.3 90.3 91.2 91.7  PLT 332   < > 421*   < > 549* 533* 644* 780* 800*   < > = values in this interval not displayed.   Basic Metabolic Panel: Recent Labs  Lab 08/14/19 0452 08/15/19 0528 08/16/19 1426 08/17/19 0446 08/18/19 0438  NA 133* 135 136 139 138  K 3.5 4.1 3.9 3.8 3.8  CL 96* 97* 99 100 100  CO2 28 29 27 28 26   GLUCOSE 127* 138* 141* 121* 131*  BUN 6 <5* 7 8 8   CREATININE 0.46 0.49 0.45 0.45 0.49  CALCIUM 8.2* 9.0 8.9 9.0 8.8*  MG 2.1 2.1 2.1 2.1 2.2   GFR: Estimated Creatinine Clearance: 88.5 mL/min (by C-G formula based on SCr of 0.49 mg/dL). Liver Function Tests: Recent Labs  Lab 08/12/19 0418  AST 15  ALT 16  ALKPHOS 82  BILITOT 0.7  PROT 5.6*  ALBUMIN 2.2*   No results for input(s): LIPASE, AMYLASE in  the last 168 hours. No results for input(s): AMMONIA in the last 168 hours. Coagulation Profile: No results for input(s): INR, PROTIME in the last 168 hours. Cardiac Enzymes: No results for input(s): CKTOTAL, CKMB, CKMBINDEX, TROPONINI in the last 168 hours. BNP (last 3 results) No results for input(s): PROBNP in the last 8760 hours. HbA1C: No results for input(s): HGBA1C in the last 72 hours. CBG: No results for input(s): GLUCAP in the last 168 hours. Lipid Profile: No results for input(s): CHOL, HDL, LDLCALC, TRIG, CHOLHDL, LDLDIRECT in the last 72 hours. Thyroid Function Tests: No results for input(s): TSH, T4TOTAL, FREET4, T3FREE, THYROIDAB in the last 72 hours. Anemia Panel: No results for input(s): VITAMINB12, FOLATE, FERRITIN, TIBC, IRON, RETICCTPCT in the last 72  hours. Sepsis Labs: No results for input(s): PROCALCITON, LATICACIDVEN in the last 168 hours.  Recent Results (from the past 240 hour(s))  Culture, blood (Routine X 2) w Reflex to ID Panel     Status: Abnormal   Collection Time: 08/08/19  5:11 PM   Specimen: BLOOD RIGHT HAND  Result Value Ref Range Status   Specimen Description BLOOD RIGHT HAND  Final   Special Requests   Final    BOTTLES DRAWN AEROBIC AND ANAEROBIC Blood Culture adequate volume   Culture  Setup Time   Final    GRAM POSITIVE COCCI IN CLUSTERS IN BOTH AEROBIC AND ANAEROBIC BOTTLES CRITICAL RESULT CALLED TO, READ BACK BY AND VERIFIED WITH: J. LEDFORD,PHARMD 0503 08/09/2019 T. TYSOR    Culture (A)  Final    STAPHYLOCOCCUS AUREUS SUSCEPTIBILITIES PERFORMED ON PREVIOUS CULTURE WITHIN THE LAST 5 DAYS. Performed at [facility], [place] [address], [place], [place] [address]    Report Status 08/11/2019 FINAL  Final  Culture, blood (routine x 2)     Status: None   Collection Time: 08/10/19  7:04 AM   Specimen: BLOOD  Result Value Ref Range Status   Specimen Description BLOOD RIGHT ANTECUBITAL  Final   Special Requests   Final    BOTTLES DRAWN  AEROBIC ONLY Blood Culture results may not be optimal due to an inadequate volume of blood received in culture bottles   Culture   Final    NO GROWTH 5 DAYS Performed at [facility], [place] [address], [place], [place] [address]    Report Status 08/15/2019 FINAL  Final  Culture, blood (routine x 2)     Status: None   Collection Time: 08/10/19  7:04 AM   Specimen: BLOOD RIGHT HAND  Result Value Ref Range Status   Specimen Description BLOOD RIGHT HAND  Final   Special Requests   Final    BOTTLES DRAWN AEROBIC ONLY Blood Culture adequate volume   Culture   Final    NO GROWTH 5 DAYS Performed at [facility], [place] [address], [place], [place] [address]    Report Status 08/15/2019 FINAL  Final         Radiology Studies: No results found.      Scheduled Meds:  amLODipine  5 mg Oral Daily   Chlorhexidine Gluconate Cloth  6 each Topical Daily   enoxaparin (LOVENOX) injection  40 mg Subcutaneous R74Y   folic acid  1 mg Oral Daily   multivitamin with minerals  1 tablet Oral Daily   nicotine  14 mg Transdermal Daily   rifampin  300 mg Oral BID WC   senna-docusate  1 tablet Oral BID   sodium chloride flush  10-40 mL Intracatheter Q12H   thiamine  100 mg Oral Daily   Continuous Infusions:   ceFAZolin (ANCEF) IV 2 g (08/18/19 0606)     LOS: 10 days   Time spent= 35 mins    Doreen Garretson Arsenio Loader, MD [facility]  If 7PM-7AM, please contact night-coverage  08/18/2019, 9:51 AM

## 2019-08-18 NOTE — Plan of Care (Signed)
  Problem: Education: Goal: Knowledge of General Education information will improve Description: Including pain rating scale, medication(s)/side effects and non-pharmacologic comfort measures Outcome: Progressing   Problem: Nutrition: Goal: Adequate nutrition will be maintained Outcome: Progressing   Problem: Safety: Goal: Ability to remain free from injury will improve Outcome: Progressing   

## 2019-08-18 NOTE — Progress Notes (Signed)
Initial Nutrition Assessment  DOCUMENTATION CODES:   Non-severe (moderate) malnutrition in context of social or environmental circumstances  INTERVENTION:  Boost Breeze po TID, each supplement provides 250 kcal and 9 grams of protein  35ml Prosource po BID, each supplement provides 100 kcal and 15 grams of protein  Continue drinking protein smoothie provided by family   NUTRITION DIAGNOSIS:   Moderate Malnutrition related to social / environmental circumstances as evidenced by mild fat depletion, mild muscle depletion, moderate muscle depletion, energy intake < 75% for > or equal to 3 months.    GOAL:   Patient will meet greater than or equal to 90% of their needs    MONITOR:   PO intake, Supplement acceptance, Labs, I & O's, Weight trends  REASON FOR ASSESSMENT:   LOS    ASSESSMENT:   Pt admitted with severe neck pain 2/2 acute cervical discitis/osteomyelitisMSSA bacteremia, POA, pt now s/p C3-7 lami C3-6 PSIF. PMH significant for IVDU and EtOH abuse.  Pt states that she has been homeless and has been eating only 1 meal per day. Pt unable to say whether she has experienced changes in her weight recently. Pt states that her father has been bringing her a large smoothie with protein added to it every day since being admitted and plans to do so throughout admission. Pt shares that she will be living with her parents upon discharge. Pt declines Ensure Magic Cup, but is agreeable to trying [employer] and Prosource.  No wt hx available for review.   PO Intake: 5-100% x 5 recorded meals (49% average meal intake)  Labs reviewed. Medications: Folvite, MVI, Senokot-S, thiamine  NUTRITION - FOCUSED PHYSICAL EXAM:    Most Recent Value  Orbital Region No depletion  Upper Arm Region Mild depletion  Thoracic and Lumbar Region No depletion  Buccal Region Mild depletion  Temple Region Mild depletion  Clavicle Bone Region Mild depletion  Clavicle and Acromion Bone Region Mild  depletion  Scapular Bone Region Mild depletion  Dorsal Hand No depletion  Patellar Region Mild depletion  Anterior Thigh Region Moderate depletion  Posterior Calf Region Moderate depletion  Edema (RD Assessment) None  Hair Reviewed  Eyes Reviewed  Mouth Reviewed  Skin Reviewed  Nails Reviewed       Diet Order:   Diet Order            Diet regular Room service appropriate? Yes; Fluid consistency: Thin  Diet effective now                 EDUCATION NEEDS:   Education needs have been addressed  Skin:  Skin Assessment: Skin Integrity Issues: Skin Integrity Issues:: Incisions Incisions: neck  Last BM:  7/9  Height:   Ht Readings from Last 1 Encounters:  08/08/19 5\' 7"  (1.702 m)    Weight:   Wt Readings from Last 1 Encounters:  08/08/19 61.2 kg    BMI:  Body mass index is 21.13 kg/m.  Estimated Nutritional Needs:   Kcal:  1850-2050  Protein:  95-105 grams  Fluid:  >1.8L/d    Larkin Ina, MS, RD, LDN RD pager number and weekend/on-call pager number located in [place].

## 2019-08-18 NOTE — Progress Notes (Signed)
Physical Therapy Treatment Patient Details Name: Monica Eaton MRN: [id number] DOB: 03-27-1975 Today's Date: 08/18/2019    History of Present Illness Monica Eaton is a 44 y.o. female with medical history significant for tobacco abuse and IV drug abuse who presents to the emergency department due to sudden onset of upper back pain which started about 3 -4 hours PTA.  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 Pt underwent, s/p C3-6 OLaminectomy & fusion.     PT Comments    Pt progressing slowly towards physical therapy goals. Appears self limiting at times, and required encouragement to progress independence with basic ADL tasks, like turning on/off water at the sink, reaching for items on her tray, and opening her meal containers/straws. Functional mobility appeared improved this session overall with decreased unsteadiness and posterior lean. Will continue to follow and progress as able per POC.     Follow Up Recommendations  SNF;Supervision/Assistance - 24 hour     Equipment Recommendations  Rolling walker with 5" wheels    Recommendations for Other Services       Precautions / Restrictions Precautions Precautions: Cervical;Fall Precaution Booklet Issued: Yes (comment) Precaution Comments: Reviewed precautions during functional mobility. Pt slid off BSC with nursing staff present on 7/7 Required Braces or Orthoses: Cervical Brace Cervical Brace: Soft collar;For comfort Restrictions Weight Bearing Restrictions: No    Mobility  Bed Mobility Overal bed mobility: Needs Assistance Bed Mobility: Sidelying to Sit   Sidelying to sit: Min assist     Sit to sidelying: Min assist General bed mobility comments: Increased time. Assist required for trunk elevation to full sitting position.   Transfers Overall transfer level: Needs assistance Equipment used: Rolling walker (2 wheeled) Transfers: Sit to/from Stand Sit  to Stand: Min assist Stand pivot transfers: Mod assist;+2 physical assistance;+2 safety/equipment       General transfer comment: VC's for hand placement on seated surface for safety. Able to power-up to full stand with min assist. +2 mainly due to anxiety.  Ambulation/Gait Ambulation/Gait assistance: Min assist;Min guard Gait Distance (Feet): 20 Feet Assistive device: Rolling walker (2 wheeled) Gait Pattern/deviations: Step-through pattern;Decreased stride length;Narrow base of support;Trunk flexed Gait velocity: decreased Gait velocity interpretation: <1.31 ft/sec, indicative of household ambulator General Gait Details: Pt required occasional min assist for balance support and safety however was grossly at a min guard assist level. Ambulated in room only to bathroom, washed hands at the sink, and then ambulated back to the recliner chair.    Stairs             Wheelchair Mobility    Modified Rankin (Stroke Patients Only)       Balance Overall balance assessment: Needs assistance Sitting-balance support: Feet supported;Bilateral upper extremity supported Sitting balance-Leahy Scale: Poor Sitting balance - Comments: requires UE support   Standing balance support: Bilateral upper extremity supported Standing balance-Leahy Scale: Poor Standing balance comment: dependent on walker and occasional physical assist                            Cognition Arousal/Alertness: Awake/alert Behavior During Therapy: Anxious Overall Cognitive Status: Within Functional Limits for tasks assessed                                 General Comments: very anxious with basic movements       Exercises General Exercises -  Lower Extremity Ankle Circles/Pumps: AROM;Both;10 reps;Supine    General Comments        Pertinent Vitals/Pain Pain Assessment: Faces Faces Pain Scale: Hurts little more Pain Location: Neck, shoulders Pain Descriptors / Indicators:  Grimacing;Operative site guarding;Sore Pain Intervention(s): Monitored during session;Repositioned    Home Living                      Prior Function            PT Goals (current goals can now be found in the care plan section) Acute Rehab PT Goals Patient Stated Goal: Feel better PT Goal Formulation: With patient Time For Goal Achievement: 08/27/19 Potential to Achieve Goals: Fair Progress towards PT goals: Progressing toward goals    Frequency    Min 5X/week      PT Plan Current plan remains appropriate    Co-evaluation              AM-PAC PT "6 Clicks" Mobility   Outcome Measure  Help needed turning from your back to your side while in a flat bed without using bedrails?: A Little Help needed moving from lying on your back to sitting on the side of a flat bed without using bedrails?: A Lot Help needed moving to and from a bed to a chair (including a wheelchair)?: A Lot Help needed standing up from a chair using your arms (e.g., wheelchair or bedside chair)?: A Lot Help needed to walk in hospital room?: A Lot Help needed climbing 3-5 steps with a railing? : Total 6 Click Score: 12    End of Session Equipment Utilized During Treatment: Gait belt Activity Tolerance: Patient tolerated treatment well Patient left: in chair;with call bell/phone within reach;with chair alarm set Nurse Communication: Mobility status PT Visit Diagnosis: Unsteadiness on feet (R26.81);Difficulty in walking, not elsewhere classified (R26.2)     Time: 6644-0347 PT Time Calculation (min) (ACUTE ONLY): 17 min  Charges:  $Gait Training: 8-22 mins                     Rolinda Roan, PT, DPT Acute Rehabilitation Services Pager: [phone] Office: [phone]    Thelma Comp 08/18/2019, 12:54 PM

## 2019-08-18 NOTE — Progress Notes (Signed)
OT Cancellation Note  Patient Details Name: Monica Eaton MRN: [id number] DOB: 23-Aug-1975   Cancelled Treatment:    Reason Eval/Treat Not Completed: Pain limiting ability to participate;Other (comment) pt reports pain and declined OT session. Pt reports she has been up all day and has been walking to BR with nursing. Will check back as time allows for OT session.  Lanier Clam., COTA/L Acute Rehabilitation Services [phone]   Monica Eaton 08/18/2019, 2:52 PM

## 2019-08-19 DIAGNOSIS — E440 Moderate protein-calorie malnutrition: Secondary | ICD-10-CM | POA: Insufficient documentation

## 2019-08-19 MED ORDER — HYDROMORPHONE HCL 1 MG/ML IJ SOLN
1.0000 mg | INTRAMUSCULAR | Status: DC | PRN
  Administered 2019-08-19: 2 mg via INTRAVENOUS
  Administered 2019-08-19: 1 mg via INTRAVENOUS
  Administered 2019-08-19 – 2019-08-21 (×10): 2 mg via INTRAVENOUS
  Filled 2019-08-19: qty 1
  Filled 2019-08-19 (×7): qty 2
  Filled 2019-08-19: qty 1
  Filled 2019-08-19 (×4): qty 2

## 2019-08-19 MED ORDER — GABAPENTIN 600 MG PO TABS
300.0000 mg | ORAL_TABLET | Freq: Two times a day (BID) | ORAL | Status: DC
  Filled 2019-08-19: qty 1

## 2019-08-19 MED ORDER — OXYCODONE HCL 5 MG PO TABS
10.0000 mg | ORAL_TABLET | ORAL | Status: DC | PRN
  Administered 2019-08-19 – 2019-09-08 (×77): 10 mg via ORAL
  Filled 2019-08-19 (×80): qty 2

## 2019-08-19 MED ORDER — MORPHINE SULFATE ER 15 MG PO TBCR
15.0000 mg | EXTENDED_RELEASE_TABLET | Freq: Two times a day (BID) | ORAL | Status: DC
  Administered 2019-08-19 – 2019-08-22 (×7): 15 mg via ORAL
  Filled 2019-08-19 (×7): qty 1

## 2019-08-19 NOTE — Progress Notes (Addendum)
PROGRESS NOTE    Monica Eaton  HCW:[id number] DOB: [birth year] DOA: 08/07/2019 PCP: Patient, No Pcp Per   Brief Narrative:  44 year old female with history of tobacco abuse and IV drug abuse presented with severe neck pain and upper back pain.  In the ED, she had leukocytosis with CRP of 9.4.  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  C2-3 nonsegmentation and 3 mm of anterolisthesis at C3-4 was also noted.  An attempt for MRI cervical spine with contrast was unsuccessful due to patient not being able to lay still despite IV Dilaudid, Ativan and Valium.  She was subsequently transferred to [facility] for MRI under general anesthesia.  She was started on broad-spectrum IV antibiotics.  Neurosurgery was consulted.  After MRI of spine under general anesthesia, patient underwent surgical intervention by neurosurgery on 08/08/2019. She was subsequently found to have MSSA bacteremia and ID was consulted.  Plans for total of about 4 weeks of antibiotics-Ancef and rifampin.     Assessment & Plan:   Principal Problem:   Cervical pain (neck) Active Problems:   IVDU (intravenous drug user)   Tobacco abuse   Neck pain   Bandemia   MSSA bacteremia   Epidural abscess   Discitis of cervical region   Hardware complicating wound infection ([place])   Malnutrition of moderate degree    Severe neck pain secondary to acute cervical discitis/osteomyelitis MSSA bacteremia, POA -MRI cervical spine shows acute cervical discitis/osteomyelitis.  Underwent surgical intervention by neurosurgery -Seen by infectious disease-Cont Ancef and Rifampin.  Started 7/5.  Anticipate 4 weeks -For pain control-add MS Contin 15 mg every 12 hours.  Reduce oxycodone to 5 mg every 4 hours as needed, also reduce Dilaudid 1-2 mg every 4 hours as needed, Robaxin 750 mg every  6 hours as needed.  Gabapentin caused her extreme agitation -Bowel regimen -Repeat cultures-negative -Advised to wear c-collar for comfort  Polysubstance abuse Tobacco use -Urine drug screen is positive for benzodiazepines, opiates and tetrahydrocannabinol.  Counseled to quit using this. Trazodone at night for sleep.   Patient in her father requested medical letter for court date extension, letter written on 7/12.   DVT prophylaxis: Lovenox Code Status: Full Family Communication: Spoke with patient's father over the phone yesterday Disposition Plan: Status is: Inpatient   Remains inpatient appropriate because:Inpatient level of care appropriate due to severity of illness.  Might need inpatient 4 weeks of IV antibiotics     Dispo: The patient is from: Home              Anticipated d/c is to: [place]              Anticipated d/c date is: > 3 days              Patient currently is not medically stable to d/c.  Maintain hospital stay for IV antibiotics, anticipate total 4 weeks of antibiotics.  Started on 7/5   Body mass index is 21.13 kg/m.    Subjective: Sitting up in the chair reporting of neck discomfort asking me to increase her pain medications.  Review of Systems Otherwise negative except as per HPI, including: General: Denies fever, chills, night sweats or unintended weight loss. Resp: Denies cough, wheezing, shortness of breath. Cardiac: Denies chest pain, palpitations, orthopnea, paroxysmal nocturnal dyspnea. GI: Denies abdominal pain, nausea, vomiting, diarrhea or constipation GU: Denies dysuria, frequency,  hesitancy or incontinence MS: Denies muscle aches, joint pain or swelling Neuro: Denies headache, neurologic deficits (focal weakness, numbness, tingling), abnormal gait Psych: Denies anxiety, depression, SI/HI/AVH Skin: Denies new rashes or lesions ID: Denies sick contacts, exotic exposures, travel  Examination: Constitutional: Not in acute  distress Respiratory: Clear to auscultation bilaterally Cardiovascular: Normal sinus rhythm, no rubs Abdomen: Nontender nondistended good bowel sounds Musculoskeletal: No edema noted Skin: Surgical site of C-spine noted without evidence of active bleeding or drainage Neurologic: CN 2-12 grossly intact.  And nonfocal Psychiatric: Normal judgment and insight. Alert and oriented x 3. Normal mood. Objective: Vitals:   08/18/19 1741 08/18/19 1957 08/18/19 2342 08/19/19 0759  BP: 127/75 134/86 125/75 123/82  Pulse: 98 99 92 89  Resp: 18 18 18 18   Temp: 98.2 F (36.8 C) 98.3 F (36.8 C) 98.4 F (36.9 C) 97.7 F (36.5 C)  TempSrc: Oral Oral Oral Oral  SpO2: 100% 100% 98% 98%  Weight:      Height:        Intake/Output Summary (Last 24 hours) at 08/19/2019 1016 Last data filed at 08/19/2019 0800 Gross per 24 hour  Intake --  Output 400 ml  Net -400 ml   Filed Weights   08/07/19 2205 08/08/19 1253 08/08/19 1823  Weight: 59 kg 59 kg 61.2 kg     Data Reviewed:   CBC: Recent Labs  Lab 08/13/19 0418 08/13/19 0418 08/14/19 0452 08/15/19 0528 08/16/19 1426 08/17/19 0446 08/18/19 1021  WBC 17.0*   < > 15.6* 13.2* 14.5* 13.4* 13.3*  NEUTROABS 14.1*  --   --   --   --   --   --   HGB 11.0*   < > 9.7* 10.8* 11.1* 10.8* 10.4*  HCT 31.6*   < > 28.4* 32.5* 33.3* 33.1* 32.2*  MCV 90.0   < > 89.3 90.3 91.2 91.7 93.9  PLT 549*   < > 533* 644* 780* 800* 849*   < > = values in this interval not displayed.   Basic Metabolic Panel: Recent Labs  Lab 08/14/19 0452 08/15/19 0528 08/16/19 1426 08/17/19 0446 08/18/19 0438  NA 133* 135 136 139 138  K 3.5 4.1 3.9 3.8 3.8  CL 96* 97* 99 100 100  CO2 28 29 27 28 26   GLUCOSE 127* 138* 141* 121* 131*  BUN 6 <5* 7 8 8   CREATININE 0.46 0.49 0.45 0.45 0.49  CALCIUM 8.2* 9.0 8.9 9.0 8.8*  MG 2.1 2.1 2.1 2.1 2.2   GFR: Estimated Creatinine Clearance: 88.5 mL/min (by C-G formula based on SCr of 0.49 mg/dL). Liver Function Tests: No  results for input(s): AST, ALT, ALKPHOS, BILITOT, PROT, ALBUMIN in the last 168 hours. No results for input(s): LIPASE, AMYLASE in the last 168 hours. No results for input(s): AMMONIA in the last 168 hours. Coagulation Profile: No results for input(s): INR, PROTIME in the last 168 hours. Cardiac Enzymes: No results for input(s): CKTOTAL, CKMB, CKMBINDEX, TROPONINI in the last 168 hours. BNP (last 3 results) No results for input(s): PROBNP in the last 8760 hours. HbA1C: No results for input(s): HGBA1C in the last 72 hours. CBG: No results for input(s): GLUCAP in the last 168 hours. Lipid Profile: No results for input(s): CHOL, HDL, LDLCALC, TRIG, CHOLHDL, LDLDIRECT in the last 72 hours. Thyroid Function Tests: No results for input(s): TSH, T4TOTAL, FREET4, T3FREE, THYROIDAB in the last 72 hours. Anemia Panel: No results for input(s): VITAMINB12, FOLATE, FERRITIN, TIBC, IRON, RETICCTPCT in the last 72 hours. Sepsis  Labs: No results for input(s): PROCALCITON, LATICACIDVEN in the last 168 hours.  Recent Results (from the past 240 hour(s))  Culture, blood (routine x 2)     Status: None   Collection Time: 08/10/19  7:04 AM   Specimen: BLOOD  Result Value Ref Range Status   Specimen Description BLOOD RIGHT ANTECUBITAL  Final   Special Requests   Final    BOTTLES DRAWN AEROBIC ONLY Blood Culture results may not be optimal due to an inadequate volume of blood received in culture bottles   Culture   Final    NO GROWTH 5 DAYS Performed at [facility], [place] [address], [place], [place] [address]    Report Status 08/15/2019 FINAL  Final  Culture, blood (routine x 2)     Status: None   Collection Time: 08/10/19  7:04 AM   Specimen: BLOOD RIGHT HAND  Result Value Ref Range Status   Specimen Description BLOOD RIGHT HAND  Final   Special Requests   Final    BOTTLES DRAWN AEROBIC ONLY Blood Culture adequate volume   Culture   Final    NO GROWTH 5 DAYS Performed at [facility], [place] [address], [place], [place] [address]    Report Status 08/15/2019 FINAL  Final         Radiology Studies: No results found.      Scheduled Meds: . (feeding supplement) PROSource Plus  30 mL Oral BID BM  . amLODipine  5 mg Oral Daily  . Chlorhexidine Gluconate Cloth  6 each Topical Daily  . enoxaparin (LOVENOX) injection  40 mg Subcutaneous Q24H  . feeding supplement  1 Container Oral TID BM  . folic acid  1 mg Oral Daily  . morphine  15 mg Oral Q12H  . multivitamin with minerals  1 tablet Oral Daily  . nicotine  14 mg Transdermal Daily  . rifampin  300 mg Oral BID WC  . senna-docusate  1 tablet Oral BID  . sodium chloride flush  10-40 mL Intracatheter Q12H  . thiamine  100 mg Oral Daily   Continuous Infusions: .  ceFAZolin (ANCEF) IV 2 g (08/19/19 0548)     LOS: 11 days   Time spent= 35 mins    Lielle Vandervort Arsenio Loader, MD [facility]  If 7PM-7AM, please contact night-coverage  08/19/2019, 10:16 AM

## 2019-08-19 NOTE — Progress Notes (Signed)
Occupational Therapy Treatment Patient Details Name: Monica Eaton MRN: [id number] DOB: 1976-01-09 Today's Date: 08/19/2019    History of present illness Monica Eaton is a 44 y.o. female with medical history significant for tobacco abuse and IV drug abuse who presents to the emergency department due to sudden onset of upper back pain which started about 3 -4 hours PTA.  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 Pt underwent, s/p C3-6 OLaminectomy & fusion.    OT comments  Pt making steady progress towards OT goals this session. Pt continues to be fixated on pain, but able to progress OOB to complete standing ADLs at sink . Education provided on compensatory methods for maintaining cervical precautions during oral care. Pt reports numbness in bilateral hands and decreased grip strength noted. Issued pt built up foam for utensils to assist with ADL tasks. DC plan remains appropriate, will follow acutely per POC.    Follow Up Recommendations  CIR    Equipment Recommendations  Other (comment) (TBD)    Recommendations for Other Services      Precautions / Restrictions Precautions Precautions: Cervical;Fall Precaution Booklet Issued: Yes (comment) Precaution Comments: Reviewed precautions during functional mobility. Pt slid off BSC with nursing staff present on 7/7 Required Braces or Orthoses: Cervical Brace Cervical Brace: Soft collar;For comfort (pt declined wearing collar during session) Restrictions Weight Bearing Restrictions: No       Mobility Bed Mobility Overal bed mobility: Needs Assistance Bed Mobility: Rolling;Sidelying to Sit Rolling: Supervision Sidelying to sit: Min guard       General bed mobility comments: light min guard to elevate trunk. pt rolling R<>L with supervision  Transfers Overall transfer level: Needs assistance Equipment used: Rolling walker (2 wheeled) Transfers: Sit to/from  Stand Sit to Stand: Supervision         General transfer comment: supervision to stand from EOB with rw    Balance Overall balance assessment: Needs assistance Sitting-balance support: Feet supported;No upper extremity supported Sitting balance-Leahy Scale: Fair     Standing balance support: Single extremity supported;During functional activity Standing balance-Leahy Scale: Poor Standing balance comment: at least one UE supported during standing ADLs                           ADL either performed or assessed with clinical judgement   ADL Overall ADL's : Needs assistance/impaired     Grooming: Oral care;Standing;Supervision/safety;Cueing for compensatory techniques Grooming Details (indicate cue type and reason): cues for compensatory method to maintain cervical precautions                 Toilet Transfer: Min Marine scientist Details (indicate cue type and reason): simulated via functional mobility with RW         Functional mobility during ADLs: Min guard;Rolling walker General ADL Comments: pt with improved functional activity tolerance this session ablel to stand at sink to brush teeth, pt continues to be limited by pain     Vision       Perception     Praxis      Cognition Arousal/Alertness: Awake/alert Behavior During Therapy: Anxious;Agitated (agitated in relation to pain) Overall Cognitive Status: Within Functional Limits for tasks assessed                                 General Comments: overall WFL but fixated on pain  Exercises     Shoulder Instructions       General Comments pt declined wearing collar; issued pt built up handles to assist with grip    Pertinent Vitals/ Pain       Pain Assessment: Faces Faces Pain Scale: Hurts worst Pain Location: Neck, shoulders Pain Descriptors / Indicators: Grimacing;Moaning;Crying;Discomfort Pain Intervention(s): Limited activity within  patient's tolerance;Monitored during session;Repositioned;Patient requesting pain meds-RN notified  Home Living                                          Prior Functioning/Environment              Frequency  Min 2X/week        Progress Toward Goals  OT Goals(current goals can now be found in the care plan section)  Progress towards OT goals: Progressing toward goals  Acute Rehab OT Goals Patient Stated Goal: Feel better OT Goal Formulation: With patient Time For Goal Achievement: 08/28/19 Potential to Achieve Goals: Good  Plan Discharge plan remains appropriate;Frequency remains appropriate    Co-evaluation                 AM-PAC OT "6 Clicks" Daily Activity     Outcome Measure   Help from another person eating meals?: A Little Help from another person taking care of personal grooming?: A Little Help from another person toileting, which includes using toliet, bedpan, or urinal?: A Little Help from another person bathing (including washing, rinsing, drying)?: A Little Help from another person to put on and taking off regular upper body clothing?: A Little Help from another person to put on and taking off regular lower body clothing?: A Lot 6 Click Score: 17    End of Session Equipment Utilized During Treatment: Rolling walker  OT Visit Diagnosis: Unsteadiness on feet (R26.81);Pain;Muscle weakness (generalized) (M62.81);Other abnormalities of gait and mobility (R26.89);Other symptoms and signs involving the nervous system (R29.898)   Activity Tolerance Patient tolerated treatment well   Patient Left in chair;with call bell/phone within reach;with chair alarm set   Nurse Communication Mobility status;Other (comment) (pain meds)        Time: 8768-1157 OT Time Calculation (min): 28 min  Charges: OT General Charges $OT Visit: 1 Visit OT Treatments $Self Care/Home Management : 23-37 mins  Lanier Clam., COTA/L Acute Rehabilitation  Services [phone]    Ihor Gully 08/19/2019, 10:39 AM

## 2019-08-20 MED ORDER — SODIUM CHLORIDE 0.9% FLUSH: 10.0000 mL | INTRAVENOUS | Status: DC | PRN

## 2019-08-20 MED ORDER — SODIUM CHLORIDE 0.9% FLUSH: 10.0000 mL | Freq: Two times a day (BID) | INTRAVENOUS | Status: DC

## 2019-08-20 NOTE — Progress Notes (Signed)
Monica Eaton  LDJ:[id number] DOB: March 08, 1975 DOA: 08/07/2019 PCP: Patient, No Pcp Per    Brief Narrative:  44 year old with a history of IV drug abuse and tobacco abuse who presented with severe neck pain and upper back pain.  In the ED she was found to have a CRP of 9.4 with a leukocytosis.  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  MRI was initially not able to be accomplished due to the patient's lack of cooperation.  She was transferred to [facility] and underwent MRI with general anesthesia.  Neurosurgery was consulted and the patient required surgical intervention 08/08/2019.  She was ultimately found to have a MSSA bacteremia for which ID was consulted.  At this time the plan is for her to complete 4 weeks of IV antibiotics as an inpatient.  Antimicrobials:  Cefazolin 7/5 > Rifampin 7/6 > Vancomycin 7/3 > 7/5  Subjective: The patient complains of uncontrolled pain and specifically states that Dilaudid IV is the only thing that helps.  She denies shortness of breath fevers or chills.  She appears calm and pleasant at the time of my exam.  Assessment & Plan:  Acute MSSA cervical discitis/osteomyelitis - MSSA bacteremia POA - s/p C3-7 lami C3-6 PSIF Confirmed with MRI of cervical spine under general anesthesia -status post surgical intervention per neurosurgery -antibiotics being directed by infectious disease service  IV drug abuse -polysubstance abuse Urine drug screen positive for benzodiazepines, opiates, and THC -patient is also a smoker  DVT prophylaxis: Lovenox Code Status: FULL CODE Family Communication:  Status is: Inpatient  Remains inpatient appropriate because:IV treatments appropriate due to intensity of illness or inability to take PO   Dispo: The patient is from: Home              Anticipated d/c is to: [place]              Anticipated d/c date is: > 3 days               Patient currently is not medically stable to d/c.   Consultants:  Neurosurgery ID   Objective: Blood pressure 120/77, pulse 89, temperature 98.1 F (36.7 C), temperature source Oral, resp. rate 18, height 5\' 7"  (1.702 m), weight 61.2 kg, SpO2 100 %.  Intake/Output Summary (Last 24 hours) at 08/20/2019 1100 Last data filed at 08/19/2019 1200 Gross per 24 hour  Intake 320 ml  Output --  Net 320 ml   Filed Weights   08/07/19 2205 08/08/19 1253 08/08/19 1823  Weight: 59 kg 59 kg 61.2 kg    Examination: General: No acute respiratory distress Lungs: Clear to auscultation bilaterally without wheezes or crackles Cardiovascular: Regular rate and rhythm without murmur gallop or rub normal S1 and S2 Abdomen: Nontender, nondistended, soft, bowel sounds positive, no rebound, no ascites, no appreciable mass Extremities: No significant cyanosis, clubbing, or edema bilateral lower extremities  CBC: Recent Labs  Lab 08/16/19 1426 08/17/19 0446 08/18/19 1021  WBC 14.5* 13.4* 13.3*  HGB 11.1* 10.8* 10.4*  HCT 33.3* 33.1* 32.2*  MCV 91.2 91.7 93.9  PLT 780* 800* 030*   Basic Metabolic Panel: Recent Labs  Lab 08/16/19 1426 08/17/19 0446 08/18/19 0438  NA 136 139 138  K 3.9 3.8 3.8  CL 99 100 100  CO2 27 28 26   GLUCOSE 141* 121* 131*  BUN 7 8 8   CREATININE 0.45 0.45 0.49  CALCIUM 8.9 9.0 8.8*  MG 2.1 2.1 2.2   GFR: Estimated Creatinine Clearance: 88.5 mL/min (by C-G formula based on SCr of 0.49 mg/dL).  Liver Function Tests: No results for input(s): AST, ALT, ALKPHOS, BILITOT, PROT, ALBUMIN in the last 168 hours. No results for input(s): LIPASE, AMYLASE in the last 168 hours. No results for input(s): AMMONIA in the last 168 hours.   Scheduled Meds: . (feeding supplement) PROSource Plus  30 mL Oral BID BM  . amLODipine  5 mg Oral Daily  . Chlorhexidine Gluconate Cloth  6 each Topical Daily  . enoxaparin (LOVENOX) injection  40 mg Subcutaneous Q24H  . feeding supplement   1 Container Oral TID BM  . folic acid  1 mg Oral Daily  . morphine  15 mg Oral Q12H  . multivitamin with minerals  1 tablet Oral Daily  . nicotine  14 mg Transdermal Daily  . rifampin  300 mg Oral BID WC  . senna-docusate  1 tablet Oral BID  . sodium chloride flush  10-40 mL Intracatheter Q12H  . thiamine  100 mg Oral Daily   Continuous Infusions: .  ceFAZolin (ANCEF) IV 2 g (08/20/19 1115)     LOS: 12 days   Cherene Altes, MD [facility] Office  [phone] Pager - Text Page per Amion  If 7PM-7AM, please contact night-coverage per Amion 08/20/2019, 11:00 AM

## 2019-08-20 NOTE — Progress Notes (Signed)
Occupational Therapy Treatment Patient Details Name: Monica Eaton MRN: [id number] DOB: Jul 15, 1975 Today's Date: 08/20/2019    History of present illness Alfredo Collymore is a 44 y.o. female with medical history significant for tobacco abuse and IV drug abuse who presents to the emergency department due to sudden onset of upper back pain which started about 3 -4 hours PTA.  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 Pt underwent, s/p C3-6 OLaminectomy & fusion.    OT comments  Pt making steady progress towards OT goals this session. Pt continues to present with pain impacting pts ability to complete BADLs however pt continues to progress a little more each session. Pt completed functional mobility to BR and opted for bed level bathing vs standing at sink d/t pain. Initated education on LB AE for bathing however pt would benefit from continued education. Pt would continue to benefit from skilled occupational therapy while admitted and after d/c to address the below listed limitations in order to improve overall functional mobility and facilitate independence with BADL participation. DC plan remains appropriate, will follow acutely per POC.     Follow Up Recommendations  CIR    Equipment Recommendations  Other (comment) (TBD)    Recommendations for Other Services      Precautions / Restrictions Precautions Precautions: Cervical;Fall Precaution Booklet Issued: Yes (comment) Precaution Comments: reviewed precautions during functional mobility and ADLs Required Braces or Orthoses: Cervical Brace Cervical Brace: Soft collar;For comfort (pt refusing to wear collar) Restrictions Weight Bearing Restrictions: No       Mobility Bed Mobility Overal bed mobility: Needs Assistance Bed Mobility: Rolling;Sidelying to Sit;Sit to Sidelying Rolling: Supervision Sidelying to sit: Min assist     Sit to sidelying: Min guard General bed  mobility comments: light MIN A to elevate trunk into sitting. pt rolle R<>L with supervision. pt returned self to sidelying with min guard  Transfers Overall transfer level: Needs assistance Equipment used: Rolling walker (2 wheeled) Transfers: Sit to/from Stand Sit to Stand: Supervision;Min guard         General transfer comment: supervision to stand from EOB with rw, min guard from toilet needing cues for hand placement    Balance Overall balance assessment: Needs assistance Sitting-balance support: Feet supported;No upper extremity supported Sitting balance-Leahy Scale: Fair     Standing balance support: Single extremity supported;During functional activity Standing balance-Leahy Scale: Poor Standing balance comment: reliant on BUE support in standing                           ADL either performed or assessed with clinical judgement   ADL Overall ADL's : Needs assistance/impaired         Upper Body Bathing: Moderate assistance;Bed level Upper Body Bathing Details (indicate cue type and reason): to wash back with pt rolling R<>L from supine Lower Body Bathing: Maximal assistance;Bed level Lower Body Bathing Details (indicate cue type and reason): pt required assist with wash LB from supine in bed. brief education on using LH sponge for LB bathing to maintain precautions Upper Body Dressing : Maximal assistance;Bed level Upper Body Dressing Details (indicate cue type and reason): to don new gown from bed level     Toilet Transfer: Min guard;RW;Ambulation;Regular Toilet;Grab bars   Toileting- Clothing Manipulation and Hygiene: Set up;Sitting/lateral lean       Functional mobility during ADLs: Min guard;Rolling walker General ADL Comments: pt continues to be limited by pain but  continues to progress activity tolerance each session     Vision       Perception     Praxis      Cognition Arousal/Alertness: Awake/alert Behavior During Therapy: WFL for  tasks assessed/performed Overall Cognitive Status: Within Functional Limits for tasks assessed                                          Exercises     Shoulder Instructions       General Comments pt declined wearing cervical collar    Pertinent Vitals/ Pain       Pain Assessment: 0-10 Pain Score: 9  Pain Location: Neck, shoulders Pain Descriptors / Indicators: Grimacing;Moaning;Crying;Discomfort Pain Intervention(s): Limited activity within patient's tolerance;Monitored during session;Repositioned;Patient requesting pain meds-RN notified  Home Living                                          Prior Functioning/Environment              Frequency  Min 2X/week        Progress Toward Goals  OT Goals(current goals can now be found in the care plan section)  Progress towards OT goals: Progressing toward goals  Acute Rehab OT Goals Patient Stated Goal: Feel better OT Goal Formulation: With patient Time For Goal Achievement: 08/28/19 Potential to Achieve Goals: Good  Plan Discharge plan remains appropriate;Frequency remains appropriate    Co-evaluation                 AM-PAC OT "6 Clicks" Daily Activity     Outcome Measure   Help from another person eating meals?: A Little (opening items) Help from another person taking care of personal grooming?: A Little Help from another person toileting, which includes using toliet, bedpan, or urinal?: A Little Help from another person bathing (including washing, rinsing, drying)?: A Little Help from another person to put on and taking off regular upper body clothing?: A Little Help from another person to put on and taking off regular lower body clothing?: A Lot 6 Click Score: 17    End of Session Equipment Utilized During Treatment: Rolling walker  OT Visit Diagnosis: Unsteadiness on feet (R26.81);Pain;Muscle weakness (generalized) (M62.81);Other abnormalities of gait and mobility  (R26.89);Other symptoms and signs involving the nervous system (R29.898)   Activity Tolerance Patient tolerated treatment well   Patient Left in bed;with call bell/phone within reach   Nurse Communication Mobility status        Time: 7282-0601 OT Time Calculation (min): 25 min  Charges: OT General Charges $OT Visit: 1 Visit OT Treatments $Self Care/Home Management : 23-37 mins  Lanier Clam., COTA/L Acute Rehabilitation Services [phone]    Ihor Gully 08/20/2019, 4:45 PM

## 2019-08-20 NOTE — Progress Notes (Signed)
PT Cancellation Note  Patient Details Name: Genesia Caslin MRN: [id number] DOB: 1975/06/10   Cancelled Treatment:    Reason Eval/Treat Not Completed: Fatigue/lethargy limiting ability to participate - pt sleeping soundly upon PT arrival, will check back as schedule allows.  [place] Pager [phone]  Office [phone]    Roxine Caddy D Elonda Husky 08/20/2019, 4:05 PM

## 2019-08-21 LAB — COMPREHENSIVE METABOLIC PANEL
ALT: 80 U/L — ABNORMAL HIGH (ref 0–44)
AST: 91 U/L — ABNORMAL HIGH (ref 15–41)
Albumin: 2.7 g/dL — ABNORMAL LOW (ref 3.5–5.0)
Alkaline Phosphatase: 118 U/L (ref 38–126)
Anion gap: 10 (ref 5–15)
BUN: 6 mg/dL (ref 6–20)
CO2: 29 mmol/L (ref 22–32)
Calcium: 9.2 mg/dL (ref 8.9–10.3)
Chloride: 99 mmol/L (ref 98–111)
Creatinine, Ser: 0.52 mg/dL (ref 0.44–1.00)
GFR calc Af Amer: >60 mL/min (ref >60)
GFR calc non Af Amer: >60 mL/min (ref >60)
Glucose, Bld: 109 mg/dL — ABNORMAL HIGH (ref 70–99)
Potassium: 4.2 mmol/L (ref 3.5–5.1)
Sodium: 138 mmol/L (ref 135–145)
Total Bilirubin: 0.1 mg/dL — ABNORMAL LOW (ref 0.3–1.2)
Total Protein: 6.6 g/dL (ref 6.5–8.1)

## 2019-08-21 MED ORDER — HYDROMORPHONE HCL 1 MG/ML IJ SOLN
1.0000 mg | INTRAMUSCULAR | Status: DC | PRN
  Administered 2019-08-21 – 2019-08-23 (×14): 2 mg via INTRAVENOUS
  Administered 2019-08-23: 1 mg via INTRAVENOUS
  Administered 2019-08-23: 2 mg via INTRAVENOUS
  Administered 2019-08-23: 1 mg via INTRAVENOUS
  Administered 2019-08-23 – 2019-08-29 (×46): 2 mg via INTRAVENOUS
  Administered 2019-08-29 (×2): 1 mg via INTRAVENOUS
  Administered 2019-08-30 – 2019-09-01 (×18): 2 mg via INTRAVENOUS
  Filled 2019-08-21 (×7): qty 2
  Filled 2019-08-21: qty 1
  Filled 2019-08-21 (×4): qty 2
  Filled 2019-08-21: qty 1
  Filled 2019-08-21 (×16): qty 2
  Filled 2019-08-21: qty 1
  Filled 2019-08-21 (×19): qty 2
  Filled 2019-08-21: qty 1
  Filled 2019-08-21 (×35): qty 2

## 2019-08-21 NOTE — Progress Notes (Signed)
Physical Therapy Treatment Patient Details Name: Monica Eaton MRN: [id number] DOB: [birth year] Today's Date: 08/21/2019    History of Present Illness Monica Eaton is a 44 y.o. female with medical history significant for tobacco abuse and IV drug abuse who presents to the emergency department due to sudden onset of upper back pain which started about 3 -4 hours PTA.  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 Pt underwent, s/p C3-6 OLaminectomy & fusion.     PT Comments    Pt received in bed, reporting 9/10 pain. With encouragement, agreeable to participation in therapy. She required min assist bed mobility, min guard assist sit to stand, and min guard assist ambulation 57' with RW. Pt in recliner at end of session eating lunch.    Follow Up Recommendations  SNF;Supervision/Assistance - 24 hour     Equipment Recommendations  Rolling walker with 5" wheels    Recommendations for Other Services       Precautions / Restrictions Precautions Precautions: Cervical;Fall Precaution Comments: reviewed precautions Required Braces or Orthoses: Cervical Brace Cervical Brace: Soft collar;For comfort Restrictions Other Position/Activity Restrictions: pt declining soft collar    Mobility  Bed Mobility Overal bed mobility: Needs Assistance Bed Mobility: Rolling;Sidelying to Sit Rolling: Supervision Sidelying to sit: Min assist       General bed mobility comments: +rail, assist to elevate trunk  Transfers Overall transfer level: Needs assistance Equipment used: Rolling walker (2 wheeled) Transfers: Sit to/from Stand Sit to Stand: Min guard;From elevated surface         General transfer comment: cues for hand placement, increased time  Ambulation/Gait Ambulation/Gait assistance: Min guard Gait Distance (Feet): 60 Feet Assistive device: Rolling walker (2 wheeled) Gait Pattern/deviations: Step-through  pattern;Decreased stride length Gait velocity: decreased Gait velocity interpretation: <1.31 ft/sec, indicative of household ambulator General Gait Details: Pt declining hallway ambulation.   Stairs             Wheelchair Mobility    Modified Rankin (Stroke Patients Only)       Balance Overall balance assessment: Needs assistance Sitting-balance support: Feet supported;No upper extremity supported Sitting balance-Leahy Scale: Good     Standing balance support: During functional activity;Bilateral upper extremity supported Standing balance-Leahy Scale: Poor Standing balance comment: reliant on BUE support in standing                            Cognition Arousal/Alertness: Awake/alert Behavior During Therapy: WFL for tasks assessed/performed Overall Cognitive Status: Within Functional Limits for tasks assessed                                 General Comments: overall WFL but fixated on pain      Exercises      General Comments        Pertinent Vitals/Pain Pain Assessment: 0-10 Pain Score: 9  Pain Location: neck Pain Descriptors / Indicators: Grimacing;Crying;Discomfort;Guarding Pain Intervention(s): Monitored during session;Repositioned    Home Living                      Prior Function            PT Goals (current goals can now be found in the care plan section) Acute Rehab PT Goals Patient Stated Goal: decrease pain Progress towards PT goals: Progressing toward goals    Frequency  Min 5X/week      PT Plan Current plan remains appropriate    Co-evaluation              AM-PAC PT "6 Clicks" Mobility   Outcome Measure  Help needed turning from your back to your side while in a flat bed without using bedrails?: A Little Help needed moving from lying on your back to sitting on the side of a flat bed without using bedrails?: A Little Help needed moving to and from a bed to a chair (including a  wheelchair)?: A Little Help needed standing up from a chair using your arms (e.g., wheelchair or bedside chair)?: A Little Help needed to walk in hospital room?: A Little Help needed climbing 3-5 steps with a railing? : A Lot 6 Click Score: 17    End of Session Equipment Utilized During Treatment: Gait belt Activity Tolerance: Patient tolerated treatment well Patient left: in chair;with call bell/phone within reach Nurse Communication: Mobility status PT Visit Diagnosis: Unsteadiness on feet (R26.81);Difficulty in walking, not elsewhere classified (R26.2)     Time: 8676-1950 PT Time Calculation (min) (ACUTE ONLY): 23 min  Charges:  $Gait Training: 23-37 mins                     Lorrin Goodell, [place]  Office # [phone] Pager [phone]    Monica Eaton 08/21/2019, 1:47 PM

## 2019-08-21 NOTE — Progress Notes (Signed)
Monica Eaton  VXB:[id number] DOB: 19-Mar-1975 DOA: 08/07/2019 PCP: Patient, No Pcp Per    Brief Narrative:  44 year old with a history of IV drug abuse and tobacco abuse who presented with severe neck pain and upper back pain.  In the ED she was found to have a CRP of 9.4 with a leukocytosis.  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  MRI was initially not able to be accomplished due to the patient's lack of cooperation.  She was transferred to [facility] and underwent MRI with general anesthesia.  Neurosurgery was consulted and the patient required surgical intervention 08/08/2019.  She was ultimately found to have a MSSA bacteremia for which ID was consulted.  At this time the plan is for her to complete 4 weeks of IV antibiotics as an inpatient.  Antimicrobials:  Cefazolin 7/5 > Rifampin 7/6 > Vancomycin 7/3 > 7/5  Subjective: Reports the pain is still very poorly controlled.  States Dilaudid helps but wears off very early.  Denies chest pain or shortness of breath.  Denies abdominal pain.  Assessment & Plan:  Acute MSSA cervical discitis/osteomyelitis - MSSA bacteremia POA - s/p C3-7 lami C3-6 PSIF Confirmed with MRI of cervical spine under general anesthesia -status post surgical intervention per Neurosurgery -antibiotics being directed by Infectious Disease service  IV drug abuse -polysubstance abuse Urine drug screen positive for benzodiazepines, opiates, and THC - patient is also a smoker  DVT prophylaxis: Lovenox Code Status: FULL CODE Family Communication:  Status is: Inpatient  Remains inpatient appropriate because:IV treatments appropriate due to intensity of illness or inability to take PO   Dispo: The patient is from: Home              Anticipated d/c is to: [facility]              Anticipated d/c date is: > 3 days              Patient currently is not medically stable to  d/c.   Consultants:  Neurosurgery ID   Objective: Blood pressure 103/69, pulse 89, temperature 98.9 F (37.2 C), temperature source Oral, resp. rate 18, height 5\' 7"  (1.702 m), weight 61.2 kg, SpO2 99 %.  Intake/Output Summary (Last 24 hours) at 08/21/2019 1047 Last data filed at 08/21/2019 0900 Gross per 24 hour  Intake 220 ml  Output --  Net 220 ml   Filed Weights   08/07/19 2205 08/08/19 1253 08/08/19 1823  Weight: 59 kg 59 kg 61.2 kg    Examination: General: No acute respiratory distress Lungs: CTA B without wheeze Cardiovascular: RRR without murmur Abdomen: NT/ND, soft, BS positive Extremities: No significant cyanosis, clubbing, or edema bilateral lower extremities  CBC: Recent Labs  Lab 08/16/19 1426 08/17/19 0446 08/18/19 1021  WBC 14.5* 13.4* 13.3*  HGB 11.1* 10.8* 10.4*  HCT 33.3* 33.1* 32.2*  MCV 91.2 91.7 93.9  PLT 780* 800* 330*   Basic Metabolic Panel: Recent Labs  Lab 08/16/19 1426 08/16/19 1426 08/17/19 0446 08/18/19 0438 08/21/19 0500  NA 136   < > 139 138 138  K 3.9   < > 3.8 3.8 4.2  CL 99   < > 100 100 99  CO2 27   < > 28 26 29   GLUCOSE 141*   < > 121* 131* 109*  BUN 7   < > 8 8 6   CREATININE 0.45   < > 0.45  0.49 0.52  CALCIUM 8.9   < > 9.0 8.8* 9.2  MG 2.1  --  2.1 2.2  --    < > = values in this interval not displayed.   GFR: Estimated Creatinine Clearance: 88.5 mL/min (by C-G formula based on SCr of 0.52 mg/dL).  Liver Function Tests: Recent Labs  Lab 08/21/19 0500  AST 91*  ALT 80*  ALKPHOS 118  BILITOT PENDING  PROT 6.6  ALBUMIN 2.7*     Scheduled Meds: . (feeding supplement) PROSource Plus  30 mL Oral BID BM  . amLODipine  5 mg Oral Daily  . Chlorhexidine Gluconate Cloth  6 each Topical Daily  . enoxaparin (LOVENOX) injection  40 mg Subcutaneous Q24H  . feeding supplement  1 Container Oral TID BM  . folic acid  1 mg Oral Daily  . morphine  15 mg Oral Q12H  . multivitamin with minerals  1 tablet Oral Daily  .  nicotine  14 mg Transdermal Daily  . rifampin  300 mg Oral BID WC  . senna-docusate  1 tablet Oral BID  . sodium chloride flush  10-40 mL Intracatheter Q12H  . thiamine  100 mg Oral Daily   Continuous Infusions: .  ceFAZolin (ANCEF) IV 2 g (08/21/19 0557)     LOS: 13 days   Cherene Altes, MD [facility] Office  [phone] Pager - Text Page per Amion  If 7PM-7AM, please contact night-coverage per Amion 08/21/2019, 10:47 AM

## 2019-08-22 MED ORDER — MORPHINE SULFATE ER 30 MG PO TBCR
30.0000 mg | EXTENDED_RELEASE_TABLET | Freq: Two times a day (BID) | ORAL | Status: DC
  Administered 2019-08-22 – 2019-08-24 (×4): 30 mg via ORAL
  Filled 2019-08-22 (×4): qty 1

## 2019-08-22 NOTE — Progress Notes (Signed)
Monica Eaton  PQZ:[id number] DOB: 02-Jun-1975 DOA: 08/07/2019 PCP: Patient, No Pcp Per    Brief Narrative:  44 year old with a history of IV drug abuse and tobacco abuse who presented with severe neck pain and upper back pain. In the ED she was found to have a CRP of 9.4 with a leukocytosis. 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  MRI was initially not able to be accomplished due to the patient's lack of cooperation.  She was transferred to [facility] and underwent MRI with general anesthesia.  Neurosurgery was consulted and the patient required surgical intervention 08/08/2019.  She was ultimately found to have a MSSA bacteremia for which ID was consulted.  At this time the plan is for her to complete 4 weeks of IV antibiotics as an inpatient.  Antimicrobials:  Cefazolin 7/5 > Rifampin 7/6 > Vancomycin 7/3 > 7/5  Subjective: Resting in bed.  Reports pain is modestly controlled.  States that she has regained feeling in her left foot today.  Has been up walking around the unit some with assistance.  Denies new complaints.  Assessment & Plan:  Acute MSSA cervical discitis/osteomyelitis - MSSA bacteremia POA - s/p C3-7 lami C3-6 PSIF Confirmed with MRI of cervical spine under general anesthesia -status post surgical intervention per Neurosurgery 7/2 - antibiotics being directed by Infectious Disease service w/ desire to complete 3-4 weeks of inpatient IV abx before considering discharge options -appears to be making progress clinically  IV drug abuse - polysubstance abuse Urine drug screen positive for benzodiazepines, opiates, and THC - patient is also a smoker  DVT prophylaxis: Lovenox Code Status: FULL CODE Family Communication: Extended discussion with both parents via phone concerning plans for future treatment and rehabilitation Status is: Inpatient  Remains inpatient appropriate because:IV  treatments appropriate due to intensity of illness or inability to take PO   Dispo: The patient is from: Home              Anticipated d/c is to: [facility]              Anticipated d/c date is: > 3 days              Patient currently is not medically stable to d/c.   Consultants:  Neurosurgery ID   Objective: Blood pressure 103/68, pulse 91, temperature 98.4 F (36.9 C), temperature source Oral, resp. rate 18, height 5\' 7"  (1.702 m), weight 61.2 kg, SpO2 100 %.  Intake/Output Summary (Last 24 hours) at 08/22/2019 1025 Last data filed at 08/21/2019 1500 Gross per 24 hour  Intake 440 ml  Output --  Net 440 ml   Filed Weights   08/07/19 2205 08/08/19 1253 08/08/19 1823  Weight: 59 kg 59 kg 61.2 kg    Examination: General: NAD Lungs: CTA B without wheeze Cardiovascular: RRR -no murmur Abdomen: NT/ND, soft, BS positive Extremities: No significant cyanosis, clubbing, or edema bilateral lower extremities  CBC: Recent Labs  Lab 08/16/19 1426 08/17/19 0446 08/18/19 1021  WBC 14.5* 13.4* 13.3*  HGB 11.1* 10.8* 10.4*  HCT 33.3* 33.1* 32.2*  MCV 91.2 91.7 93.9  PLT 780* 800* 335*   Basic Metabolic Panel: Recent Labs  Lab 08/16/19 1426 08/16/19 1426 08/17/19 0446 08/18/19 0438 08/21/19 0500  NA 136   < > 139 138 138  K 3.9   < > 3.8 3.8 4.2  CL 99   < > 100 100 99  CO2 27   < > 28 26 29   GLUCOSE 141*   < > 121* 131* 109*  BUN 7   < > 8 8 6   CREATININE 0.45   < > 0.45 0.49 0.52  CALCIUM 8.9   < > 9.0 8.8* 9.2  MG 2.1  --  2.1 2.2  --    < > = values in this interval not displayed.   GFR: Estimated Creatinine Clearance: 88.5 mL/min (by C-G formula based on SCr of 0.52 mg/dL).  Liver Function Tests: Recent Labs  Lab 08/21/19 0500  AST 91*  ALT 80*  ALKPHOS 118  BILITOT 0.1*  PROT 6.6  ALBUMIN 2.7*     Scheduled Meds: . (feeding supplement) PROSource Plus  30 mL Oral BID BM  . amLODipine  5 mg Oral Daily  . Chlorhexidine Gluconate Cloth  6 each  Topical Daily  . enoxaparin (LOVENOX) injection  40 mg Subcutaneous Q24H  . feeding supplement  1 Container Oral TID BM  . folic acid  1 mg Oral Daily  . morphine  15 mg Oral Q12H  . multivitamin with minerals  1 tablet Oral Daily  . nicotine  14 mg Transdermal Daily  . rifampin  300 mg Oral BID WC  . senna-docusate  1 tablet Oral BID  . sodium chloride flush  10-40 mL Intracatheter Q12H  . thiamine  100 mg Oral Daily   Continuous Infusions: .  ceFAZolin (ANCEF) IV 2 g (08/22/19 0527)     LOS: 14 days   Cherene Altes, MD [facility] Office  [phone] Pager - Text Page per Amion  If 7PM-7AM, please contact night-coverage per Amion 08/22/2019, 10:25 AM

## 2019-08-22 NOTE — Plan of Care (Signed)
  Problem: Education: Goal: Knowledge of General Education information will improve Description: Including pain rating scale, medication(s)/side effects and non-pharmacologic comfort measures Outcome: Progressing   Problem: Nutrition: Goal: Adequate nutrition will be maintained Outcome: Progressing   Problem: Safety: Goal: Ability to remain free from injury will improve Outcome: Progressing   

## 2019-08-22 NOTE — Progress Notes (Signed)
Physical Therapy Treatment Patient Details Name: Monica Eaton MRN: [id number] DOB: [birth year] Today's Date: 08/22/2019    History of Present Illness Monica Eaton is a 44 y.o. female with medical history significant for tobacco abuse and IV drug abuse who presents to the emergency department due to sudden onset of upper back pain which started about 3 -4 hours PTA.  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 Pt underwent, s/p C3-6 OLaminectomy & fusion.     PT Comments    Pt in good spirits today, engaged in therapy session. She required min assist bed mobility, min guard assist transfers with RW, and min guard assist ambulation 250' with RW. Current plan remains appropriate.   Follow Up Recommendations  SNF;Supervision/Assistance - 24 hour     Equipment Recommendations  Rolling walker with 5" wheels    Recommendations for Other Services       Precautions / Restrictions Precautions Precautions: Cervical;Fall Cervical Brace: Soft collar;For comfort Restrictions Other Position/Activity Restrictions: pt declining soft collar    Mobility  Bed Mobility Overal bed mobility: Needs Assistance Bed Mobility: Rolling;Sidelying to Sit;Sit to Sidelying Rolling: Supervision Sidelying to sit: Min assist;HOB elevated     Sit to sidelying: Min guard General bed mobility comments: +rail, assist to elevate trunk  Transfers Overall transfer level: Needs assistance Equipment used: Rolling walker (2 wheeled) Transfers: Sit to/from Stand Sit to Stand: Min guard;From elevated surface Stand pivot transfers: Min guard       General transfer comment: increased time, min guard for safety  Ambulation/Gait Ambulation/Gait assistance: Min guard Gait Distance (Feet): 250 Feet Assistive device: Rolling walker (2 wheeled)   Gait velocity: decreased Gait velocity interpretation: 1.31 - 2.62 ft/sec, indicative of limited community  ambulator General Gait Details: slow, guarded gait. Pt reporting BLE weakness toward end of gait trial. "I can feel my legs giving out on me." No LOB or knee buckling noted.   Stairs             Wheelchair Mobility    Modified Rankin (Stroke Patients Only)       Balance Overall balance assessment: Needs assistance Sitting-balance support: Feet supported;No upper extremity supported Sitting balance-Leahy Scale: Good     Standing balance support: During functional activity;Bilateral upper extremity supported Standing balance-Leahy Scale: Poor Standing balance comment: reliant on BUE support in standing                            Cognition Arousal/Alertness: Awake/alert Behavior During Therapy: WFL for tasks assessed/performed Overall Cognitive Status: Within Functional Limits for tasks assessed                                 General Comments: Pt very pleasant and engaged in session today.      Exercises      General Comments        Pertinent Vitals/Pain Pain Assessment: Faces Faces Pain Scale: Hurts even more Pain Location: neck Pain Descriptors / Indicators: Grimacing;Guarding Pain Intervention(s): Repositioned;Monitored during session    Home Living                      Prior Function            PT Goals (current goals can now be found in the care plan section) Acute Rehab PT Goals Patient Stated Goal: decrease pain  Progress towards PT goals: Progressing toward goals    Frequency    Min 5X/week      PT Plan Current plan remains appropriate    Co-evaluation              AM-PAC PT "6 Clicks" Mobility   Outcome Measure  Help needed turning from your back to your side while in a flat bed without using bedrails?: None Help needed moving from lying on your back to sitting on the side of a flat bed without using bedrails?: A Little Help needed moving to and from a bed to a chair (including a  wheelchair)?: A Little Help needed standing up from a chair using your arms (e.g., wheelchair or bedside chair)?: A Little Help needed to walk in hospital room?: A Little Help needed climbing 3-5 steps with a railing? : A Lot 6 Click Score: 18    End of Session Equipment Utilized During Treatment: Gait belt Activity Tolerance: Patient tolerated treatment well Patient left: in bed;with call bell/phone within reach Nurse Communication: Mobility status PT Visit Diagnosis: Unsteadiness on feet (R26.81);Difficulty in walking, not elsewhere classified (R26.2)     Time: 3403-7096 PT Time Calculation (min) (ACUTE ONLY): 16 min  Charges:  $Gait Training: 8-22 mins                     Lorrin Goodell, PT  Office # [phone] Pager [phone]    Lorriane Shire 08/22/2019, 9:43 AM

## 2019-08-23 MED ORDER — SODIUM CHLORIDE 0.9 % IV SOLN
INTRAVENOUS | Status: DC | PRN
  Administered 2019-08-23: 250 mL via INTRAVENOUS

## 2019-08-23 MED ORDER — CYCLOBENZAPRINE HCL 10 MG PO TABS
5.0000 mg | ORAL_TABLET | Freq: Three times a day (TID) | ORAL | Status: DC | PRN
  Administered 2019-08-23 – 2019-09-07 (×31): 10 mg via ORAL
  Filled 2019-08-23 (×31): qty 1

## 2019-08-23 NOTE — Progress Notes (Signed)
Monica Eaton  JJO:[id number] DOB: 22-Jul-1975 DOA: 08/07/2019 PCP: Patient, No Pcp Per    Brief Narrative:  44 year old with a history of IV drug abuse and tobacco abuse who presented with severe neck pain and upper back pain. In the ED she was found to have a CRP of 9.4 with a leukocytosis. 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  MRI was initially not able to be accomplished due to the patient's lack of cooperation.  She was transferred to [facility] and underwent MRI with general anesthesia.  Neurosurgery was consulted and the patient required surgical intervention 08/08/2019.  She was ultimately found to have a MSSA bacteremia for which ID was consulted.  At this time the plan is for her to complete 4 weeks of IV antibiotics as an inpatient.  Antimicrobials:  Cefazolin 7/5 > Rifampin 7/6 > Vancomycin 7/3 > 7/5  Subjective: Reports worsening of muscle spasms and neck pain today.  No shortness of breath.  No fevers or chills.  No change neurologically peripherally.  Assessment & Plan:  Acute MSSA cervical discitis/osteomyelitis - MSSA bacteremia POA - s/p C3-7 lami C3-6 PSIF Confirmed with MRI of cervical spine under general anesthesia -status post surgical intervention per Neurosurgery 7/2 - antibiotics being directed by Infectious Disease service w/ desire to complete 3-4 weeks of inpatient IV abx before considering discharge options -appears to be making progress clinically -add Flexeril for muscle spasms  IV drug abuse - polysubstance abuse Urine drug screen positive for benzodiazepines, opiates, and THC - patient is also a smoker  DVT prophylaxis: Lovenox Code Status: FULL CODE Family Communication: No family present at time of exam today Status is: Inpatient  Remains inpatient appropriate because:IV treatments appropriate due to intensity of illness or inability to take PO   Dispo: The  patient is from: Home              Anticipated d/c is to: [facility]              Anticipated d/c date is: > 3 days              Patient currently is not medically stable to d/c.   Consultants:  Neurosurgery ID   Objective: Blood pressure 109/76, pulse 87, temperature 98.5 F (36.9 C), temperature source Oral, resp. rate 18, height 5\' 7"  (1.702 m), weight 61.2 kg, SpO2 98 %.  Intake/Output Summary (Last 24 hours) at 08/23/2019 1057 Last data filed at 08/22/2019 1200 Gross per 24 hour  Intake 240 ml  Output --  Net 240 ml   Filed Weights   08/07/19 2205 08/08/19 1253 08/08/19 1823  Weight: 59 kg 59 kg 61.2 kg    Examination: General: NAD Lungs: CTA B Cardiovascular: RRR -no murmur Abdomen: NT/ND, soft, BS positive Extremities: No edema bilateral lower extremities  CBC: Recent Labs  Lab 08/16/19 1426 08/17/19 0446 08/18/19 1021  WBC 14.5* 13.4* 13.3*  HGB 11.1* 10.8* 10.4*  HCT 33.3* 33.1* 32.2*  MCV 91.2 91.7 93.9  PLT 780* 800* 160*   Basic Metabolic Panel: Recent Labs  Lab 08/16/19 1426 08/16/19 1426 08/17/19 0446 08/18/19 0438 08/21/19 0500  NA 136   < > 139 138 138  K 3.9   < > 3.8 3.8 4.2  CL 99   < > 100 100 99  CO2 27   < > 28 26 29   GLUCOSE 141*   < > 121* 131* 109*  BUN 7   < > 8 8 6   CREATININE 0.45   < > 0.45 0.49 0.52  CALCIUM 8.9   < > 9.0 8.8* 9.2  MG 2.1  --  2.1 2.2  --    < > = values in this interval not displayed.   GFR: Estimated Creatinine Clearance: 88.5 mL/min (by C-G formula based on SCr of 0.52 mg/dL).  Liver Function Tests: Recent Labs  Lab 08/21/19 0500  AST 91*  ALT 80*  ALKPHOS 118  BILITOT 0.1*  PROT 6.6  ALBUMIN 2.7*     Scheduled Meds: . (feeding supplement) PROSource Plus  30 mL Oral BID BM  . amLODipine  5 mg Oral Daily  . Chlorhexidine Gluconate Cloth  6 each Topical Daily  . enoxaparin (LOVENOX) injection  40 mg Subcutaneous Q24H  . feeding supplement  1 Container Oral TID BM  . folic acid  1 mg Oral  Daily  . morphine  30 mg Oral Q12H  . multivitamin with minerals  1 tablet Oral Daily  . nicotine  14 mg Transdermal Daily  . rifampin  300 mg Oral BID WC  . senna-docusate  1 tablet Oral BID  . sodium chloride flush  10-40 mL Intracatheter Q12H  . thiamine  100 mg Oral Daily   Continuous Infusions: .  ceFAZolin (ANCEF) IV 2 g (08/23/19 5750)     LOS: 15 days   Cherene Altes, MD [facility] Office  [phone] Pager - Text Page per Amion  If 7PM-7AM, please contact night-coverage per Amion 08/23/2019, 10:57 AM

## 2019-08-24 MED ORDER — MORPHINE SULFATE ER 15 MG PO TBCR
15.0000 mg | EXTENDED_RELEASE_TABLET | Freq: Once | ORAL | Status: AC
  Administered 2019-08-24: 15 mg via ORAL
  Filled 2019-08-24: qty 1

## 2019-08-24 MED ORDER — MORPHINE SULFATE ER 30 MG PO TBCR
45.0000 mg | EXTENDED_RELEASE_TABLET | Freq: Two times a day (BID) | ORAL | Status: DC
  Administered 2019-08-24 – 2019-09-05 (×24): 45 mg via ORAL
  Filled 2019-08-24 (×24): qty 1

## 2019-08-24 NOTE — Progress Notes (Signed)
Monica Eaton  JKD:[id number] DOB: [birth year] DOA: 08/07/2019 PCP: Patient, No Pcp Per    Brief Narrative:  44 year old with a history of IV drug abuse and tobacco abuse who presented with severe neck pain and upper back pain. In the ED she was found to have a CRP of 9.4 with a leukocytosis. 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  MRI was initially not able to be accomplished due to the patient's lack of cooperation.  She was transferred to [facility] and underwent MRI with general anesthesia.  Neurosurgery was consulted and the patient required surgical intervention 08/08/2019.  She was ultimately found to have a MSSA bacteremia for which ID was consulted.  At this time the plan is for her to complete 4 weeks of IV antibiotics as an inpatient.  Antimicrobials:  Cefazolin 7/5 > Rifampin 7/6 > Vancomycin 7/3 > 7/5  Subjective: Reports ongoing difficulty with poor pain control.  No shortness of breath nausea vomiting or abdominal pain.  Assessment & Plan:  Acute MSSA cervical discitis/osteomyelitis - MSSA bacteremia POA - s/p C3-7 lami C3-6 PSIF Confirmed with MRI of cervical spine under general anesthesia -status post surgical intervention per Neurosurgery 7/2 - antibiotics being directed by Infectious Disease service w/ desire to complete 3-4 weeks of inpatient IV abx before considering discharge options - Flexeril for muscle spasms -will adjust baseline long-acting narcotic dosing to attempt to accomplish better background pain control but also counseled patient that it will not be possible to completely relieve her pain in a safe manner  IV drug abuse - polysubstance abuse Urine drug screen positive for benzodiazepines, opiates, and THC - patient is also a smoker  DVT prophylaxis: Lovenox Code Status: FULL CODE Family Communication: No family present at time of exam today Status is:  Inpatient  Remains inpatient appropriate because:IV treatments appropriate due to intensity of illness or inability to take PO   Dispo: The patient is from: Home              Anticipated d/c is to: [facility]              Anticipated d/c date is: > 3 days              Patient currently is not medically stable to d/c.   Consultants:  Neurosurgery ID   Objective: Blood pressure 112/68, pulse 96, temperature 98.6 F (37 C), temperature source Oral, resp. rate 18, height 5\' 7"  (1.702 m), weight 61.2 kg, SpO2 100 %.  Intake/Output Summary (Last 24 hours) at 08/24/2019 1020 Last data filed at 08/24/2019 0612 Gross per 24 hour  Intake 1407.26 ml  Output --  Net 1407.26 ml   Filed Weights   08/07/19 2205 08/08/19 1253 08/08/19 1823  Weight: 59 kg 59 kg 61.2 kg    Examination: General: NAD Lungs: CTA B Cardiovascular: RRR  Abdomen: NT/ND, soft, BS positive Extremities: No edema B LE   CBC: Recent Labs  Lab 08/18/19 1021  WBC 13.3*  HGB 10.4*  HCT 32.2*  MCV 93.9  PLT 099*   Basic Metabolic Panel: Recent Labs  Lab 08/18/19 0438 08/21/19 0500  NA 138 138  K 3.8 4.2  CL 100 99  CO2 26 29  GLUCOSE 131* 109*  BUN 8 6  CREATININE 0.49 0.52  CALCIUM 8.8* 9.2  MG 2.2  --    GFR: Estimated Creatinine Clearance: 88.5 mL/min (by C-G formula based on  SCr of 0.52 mg/dL).  Liver Function Tests: Recent Labs  Lab 08/21/19 0500  AST 91*  ALT 80*  ALKPHOS 118  BILITOT 0.1*  PROT 6.6  ALBUMIN 2.7*     Scheduled Meds: . (feeding supplement) PROSource Plus  30 mL Oral BID BM  . amLODipine  5 mg Oral Daily  . Chlorhexidine Gluconate Cloth  6 each Topical Daily  . enoxaparin (LOVENOX) injection  40 mg Subcutaneous Q24H  . feeding supplement  1 Container Oral TID BM  . folic acid  1 mg Oral Daily  . morphine  30 mg Oral Q12H  . multivitamin with minerals  1 tablet Oral Daily  . nicotine  14 mg Transdermal Daily  . rifampin  300 mg Oral BID WC  . senna-docusate  1  tablet Oral BID  . sodium chloride flush  10-40 mL Intracatheter Q12H  . thiamine  100 mg Oral Daily   Continuous Infusions: . sodium chloride 10 mL/hr at 08/24/19 0320  .  ceFAZolin (ANCEF) IV 2 g (08/24/19 0612)     LOS: 16 days   Cherene Altes, MD [facility] Office  [phone] Pager - Text Page per Amion  If 7PM-7AM, please contact night-coverage per Amion 08/24/2019, 10:20 AM

## 2019-08-25 NOTE — Progress Notes (Signed)
Nutrition Follow-up  DOCUMENTATION CODES:   Non-severe (moderate) malnutrition in context of social or environmental circumstances  INTERVENTION:   - Continue Boost Breeze po TID, each supplement provides 250 kcal and 9 grams of protein  - d/c ProSource Plus due to pt refusal  - Continue MVI with minerals daily  - Continue drinking protein smoothie provided by family  NUTRITION DIAGNOSIS:   Moderate Malnutrition related to social / environmental circumstances as evidenced by mild fat depletion, mild muscle depletion, moderate muscle depletion, energy intake < 75% for > or equal to 3 months.  Ongoing  GOAL:   Patient will meet greater than or equal to 90% of their needs  Progressing  MONITOR:   PO intake, Supplement acceptance, Labs, I & O's, Weight trends  REASON FOR ASSESSMENT:   LOS    ASSESSMENT:   Pt admitted with severe neck pain 2/2 acute cervical discitis/osteomyelitisMSSA bacteremia, POA, pt now s/p C3-7 lami C3-6 PSIF. PMH significant for IVDU and EtOH abuse.  Spoke with pt at bedside. Pt very short with answers to RD questions and reports she is trying to take a nap. Pt states she is drinking 2 Boost Breeze supplements daily and does not like the ProSource Plus. RD will d/c this order.  Pt reports that she is eating well. Noted snacks at bedside (graham crackers and peanut butter). Pt denies any nutrition-related concerns at this time.  RD encouraged pt to consume all Boost Breeze supplements offered. Reiterated the importance of adequate kcal and protein intake in maintaining lean muscle mass and in healing.  Meal Completion: 15-100% x last 8 recorded meals (averaging ~60%)  Medications reviewed and include: ProSource Plus BID, Boost Breeze TID, folic acid, MVI with minerals, senna, thiamine, IV abx  Labs reviewed.  Diet Order:   Diet Order            Diet regular Room service appropriate? Yes; Fluid consistency: Thin  Diet effective now                  EDUCATION NEEDS:   Education needs have been addressed  Skin:  Skin Assessment: Skin Integrity Issues: Incisions: neck  Last BM:  08/23/19  Height:   Ht Readings from Last 1 Encounters:  08/08/19 5\' 7"  (1.702 m)    Weight:   Wt Readings from Last 1 Encounters:  08/08/19 61.2 kg    BMI:  Body mass index is 21.13 kg/m.  Estimated Nutritional Needs:   Kcal:  1850-2050  Protein:  95-105 grams  Fluid:  >1.8L/d    Gaynell Face, MS, RD, LDN Inpatient Clinical Dietitian Please see AMiON for contact information.

## 2019-08-25 NOTE — Progress Notes (Signed)
Subjective:  Still with pain but better and doing pt Antibiotics:  Anti-infectives (From admission, onward)   Start     Dose/Rate Route Frequency Ordered Stop   08/12/19 1700  rifampin (RIFADIN) capsule 300 mg     Discontinue     300 mg Oral 2 times daily with meals 08/12/19 1353     08/11/19 1400  ceFAZolin (ANCEF) IVPB 2g/100 mL premix     Discontinue     2 g 200 mL/hr over 30 Minutes Intravenous Every 8 hours 08/11/19 1159     08/10/19 1400  vancomycin (VANCOREADY) IVPB 750 mg/150 mL  Status:  Discontinued        750 mg 150 mL/hr over 60 Minutes Intravenous Every 8 hours 08/10/19 1254 08/11/19 1159   08/08/19 2042  bacitracin 50,000 Units in sodium chloride 0.9 % 500 mL irrigation  Status:  Discontinued          As needed 08/08/19 2043 08/08/19 2350   08/08/19 1800  vancomycin (VANCOCIN) IVPB 1000 mg/200 mL premix  Status:  Discontinued        1,000 mg 200 mL/hr over 60 Minutes Intravenous Every 12 hours 08/08/19 0644 08/10/19 0919   08/08/19 0800  levofloxacin (LEVAQUIN) IVPB 750 mg  Status:  Discontinued        750 mg 100 mL/hr over 90 Minutes Intravenous Every 24 hours 08/08/19 0644 08/09/19 0115   08/08/19 0645  vancomycin (VANCOREADY) IVPB 1250 mg/250 mL        1,250 mg 166.7 mL/hr over 90 Minutes Intravenous  Once 08/08/19 0632 08/08/19 0916      Medications: Scheduled Meds: . Chlorhexidine Gluconate Cloth  6 each Topical Daily  . enoxaparin (LOVENOX) injection  40 mg Subcutaneous Q24H  . feeding supplement  1 Container Oral TID BM  . folic acid  1 mg Oral Daily  . morphine  45 mg Oral Q12H  . multivitamin with minerals  1 tablet Oral Daily  . nicotine  14 mg Transdermal Daily  . rifampin  300 mg Oral BID WC  . senna-docusate  1 tablet Oral BID  . sodium chloride flush  10-40 mL Intracatheter Q12H  . thiamine  100 mg Oral Daily   Continuous Infusions: . sodium chloride 10 mL/hr at 08/24/19 2340  .  ceFAZolin (ANCEF) IV 2 g (08/25/19 1353)   PRN  Meds:.sodium chloride, acetaminophen **OR** [DISCONTINUED] acetaminophen, bisacodyl, cyclobenzaprine, HYDROmorphone (DILAUDID) injection, oxyCODONE, polyethylene glycol, sodium chloride flush, traZODone    Objective: Weight change:   Intake/Output Summary (Last 24 hours) at 08/25/2019 1523 Last data filed at 08/24/2019 2340 Gross per 24 hour  Intake 726.39 ml  Output --  Net 726.39 ml   Blood pressure 118/74, pulse 100, temperature 98.7 F (37.1 C), temperature source Oral, resp. rate 18, height 5\' 7"  (1.702 m), weight 61.2 kg, SpO2 100 %. Temp:  [98.4 F (36.9 C)-99 F (37.2 C)] 98.7 F (37.1 C) (07/19 1211) Pulse Rate:  [95-100] 100 (07/19 1211) Resp:  [16-19] 18 (07/19 1211) BP: (112-122)/(73-85) 118/74 (07/19 1211) SpO2:  [99 %-100 %] 100 % (07/19 1211)  Physical Exam: General: Alert and awake, oriented x3,  HEENT: anicteric sclera, EOMI CVS regular rate,  Chest: , no wheezing, no respiratory distress clear to auscultation bilaterally Abdomen: soft non-distended,  Extremities: no edema or deformity noted bilaterally Skin: no rashes Neuro: nonfocal  CBC:    BMET No results for input(s): NA, K, CL, CO2, GLUCOSE, BUN, CREATININE, CALCIUM  in the last 72 hours.   Liver Panel  No results for input(s): PROT, ALBUMIN, AST, ALT, ALKPHOS, BILITOT, BILIDIR, IBILI in the last 72 hours.     Sedimentation Rate No results for input(s): ESRSEDRATE in the last 72 hours. C-Reactive Protein No results for input(s): CRP in the last 72 hours.  Micro Results: Recent Results (from the past 720 hour(s))  Culture, blood (Routine X 2) w Reflex to ID Panel     Status: Abnormal   Collection Time: 08/08/19  7:27 AM   Specimen: BLOOD RIGHT FOREARM  Result Value Ref Range Status   Specimen Description BLOOD RIGHT FOREARM  Final   Special Requests   Final    BOTTLES DRAWN AEROBIC AND ANAEROBIC Blood Culture results may not be optimal due to an inadequate volume of blood received in  culture bottles   Culture  Setup Time   Final    IN BOTH AEROBIC AND ANAEROBIC BOTTLES GRAM POSITIVE COCCI IN CLUSTERS CRITICAL RESULT CALLED TO, READ BACK BY AND VERIFIED WITH: Karsten Ro [facility] 08/09/19 0102 JDW Performed at [facility], [address], [place], [place] [address]    Culture STAPHYLOCOCCUS AUREUS (A)  Final   Report Status 08/10/2019 FINAL  Final   Organism ID, Bacteria STAPHYLOCOCCUS AUREUS  Final      Susceptibility   Staphylococcus aureus - MIC*    CIPROFLOXACIN <=0.5 SENSITIVE Sensitive     ERYTHROMYCIN >=8 RESISTANT Resistant     GENTAMICIN <=0.5 SENSITIVE Sensitive     OXACILLIN 0.5 SENSITIVE Sensitive     TETRACYCLINE <=1 SENSITIVE Sensitive     VANCOMYCIN <=0.5 SENSITIVE Sensitive     TRIMETH/SULFA <=10 SENSITIVE Sensitive     CLINDAMYCIN <=0.25 SENSITIVE Sensitive     RIFAMPIN <=0.5 SENSITIVE Sensitive     Inducible Clindamycin NEGATIVE Sensitive     * STAPHYLOCOCCUS AUREUS  Blood Culture ID Panel (Reflexed)     Status: Abnormal   Collection Time: 08/08/19  7:27 AM  Result Value Ref Range Status   Enterococcus species NOT DETECTED NOT DETECTED Final   Listeria monocytogenes NOT DETECTED NOT DETECTED Final   Staphylococcus species DETECTED (A) NOT DETECTED Final    Comment: CRITICAL RESULT CALLED TO, READ BACK BY AND VERIFIED WITH: J LEDFORD PHARMD 08/09/19 0102 JDW    Staphylococcus aureus (BCID) DETECTED (A) NOT DETECTED Final    Comment: Methicillin (oxacillin) susceptible Staphylococcus aureus (MSSA). Preferred therapy is anti staphylococcal beta lactam antibiotic (Cefazolin or Nafcillin), unless clinically contraindicated. CRITICAL RESULT CALLED TO, READ BACK BY AND VERIFIED WITH: J LEDFORD [facility] 08/09/19 0102 JDW    Methicillin resistance NOT DETECTED NOT DETECTED Final   Streptococcus species NOT DETECTED NOT DETECTED Final   Streptococcus agalactiae NOT DETECTED NOT DETECTED Final   Streptococcus pneumoniae NOT DETECTED NOT DETECTED Final    Streptococcus pyogenes NOT DETECTED NOT DETECTED Final   Acinetobacter baumannii NOT DETECTED NOT DETECTED Final   Enterobacteriaceae species NOT DETECTED NOT DETECTED Final   Enterobacter cloacae complex NOT DETECTED NOT DETECTED Final   Escherichia coli NOT DETECTED NOT DETECTED Final   Klebsiella oxytoca NOT DETECTED NOT DETECTED Final   Klebsiella pneumoniae NOT DETECTED NOT DETECTED Final   Proteus species NOT DETECTED NOT DETECTED Final   Serratia marcescens NOT DETECTED NOT DETECTED Final   Haemophilus influenzae NOT DETECTED NOT DETECTED Final   Neisseria meningitidis NOT DETECTED NOT DETECTED Final   Pseudomonas aeruginosa NOT DETECTED NOT DETECTED Final   Candida albicans NOT DETECTED NOT DETECTED  Final   Candida glabrata NOT DETECTED NOT DETECTED Final   Candida krusei NOT DETECTED NOT DETECTED Final   Candida parapsilosis NOT DETECTED NOT DETECTED Final   Candida tropicalis NOT DETECTED NOT DETECTED Final    Comment: Performed at [facility], [place] [address], [place], [place] [address]  SARS Coronavirus 2 by RT PCR (hospital order, performed in [facility] hospital lab) Nasopharyngeal Nasopharyngeal Swab     Status: None   Collection Time: 08/08/19  9:02 AM   Specimen: Nasopharyngeal Swab  Result Value Ref Range Status   SARS Coronavirus 2 NEGATIVE NEGATIVE Final    Comment: (NOTE) SARS-CoV-2 target nucleic acids are NOT DETECTED.  The SARS-CoV-2 RNA is generally detectable in upper and lower respiratory specimens during the acute phase of infection. The lowest concentration of SARS-CoV-2 viral copies this assay can detect is 250 copies / mL. A negative result does not preclude SARS-CoV-2 infection and should not be used as the sole basis for treatment or other patient management decisions.  A negative result may occur with improper specimen collection / handling, submission of specimen other than nasopharyngeal swab, presence of viral mutation(s) within the areas  targeted by this assay, and inadequate number of viral copies (<250 copies / mL). A negative result must be combined with clinical observations, patient history, and epidemiological information.  Fact Sheet for Patients:   StrictlyIdeas.no  Fact Sheet for Healthcare Providers: BankingDealers.co.za  This test is not yet approved or  cleared by the [place] FDA and has been authorized for detection and/or diagnosis of SARS-CoV-2 by FDA under an Emergency Use Authorization (EUA).  This EUA will remain in effect (meaning this test can be used) for the duration of the COVID-19 declaration under Section 564(b)(1) of the Act, 21 U.S.C. section 360bbb-3(b)(1), unless the authorization is terminated or revoked sooner.  Performed at [facility], [place] [address], [place], [place] [address]   Culture, blood (Routine X 2) w Reflex to ID Panel     Status: Abnormal   Collection Time: 08/08/19  5:11 PM   Specimen: BLOOD RIGHT HAND  Result Value Ref Range Status   Specimen Description BLOOD RIGHT HAND  Final   Special Requests   Final    BOTTLES DRAWN AEROBIC AND ANAEROBIC Blood Culture adequate volume   Culture  Setup Time   Final    GRAM POSITIVE COCCI IN CLUSTERS IN BOTH AEROBIC AND ANAEROBIC BOTTLES CRITICAL RESULT CALLED TO, READ BACK BY AND VERIFIED WITH: J. LEDFORD,PHARMD 0503 08/09/2019 T. TYSOR    Culture (A)  Final    STAPHYLOCOCCUS AUREUS SUSCEPTIBILITIES PERFORMED ON PREVIOUS CULTURE WITHIN THE LAST 5 DAYS. Performed at [facility], [place] [address] [place], [place] [address]    Report Status 08/11/2019 FINAL  Final  Culture, blood (routine x 2)     Status: None   Collection Time: 08/10/19  7:04 AM   Specimen: BLOOD  Result Value Ref Range Status   Specimen Description BLOOD RIGHT ANTECUBITAL  Final   Special Requests   Final    BOTTLES DRAWN AEROBIC ONLY Blood Culture results may not be optimal due to an  inadequate volume of blood received in culture bottles   Culture   Final    NO GROWTH 5 DAYS Performed at [facility], [place] [address], [place], [place] [address]    Report Status 08/15/2019 FINAL  Final  Culture, blood (routine x 2)     Status: None  Collection Time: 08/10/19  7:04 AM   Specimen: BLOOD RIGHT HAND  Result Value Ref Range Status   Specimen Description BLOOD RIGHT HAND  Final   Special Requests   Final    BOTTLES DRAWN AEROBIC ONLY Blood Culture adequate volume   Culture   Final    NO GROWTH 5 DAYS Performed at [facility], [address] [place], [place] [address]    Report Status 08/15/2019 FINAL  Final    Studies/Results: No results found.    Assessment/Plan:  INTERVAL HISTORY: tolerating rifampin   Principal Problem:   Cervical pain (neck) Active Problems:   IVDU (intravenous drug user)   Tobacco abuse   Neck pain   Bandemia   MSSA bacteremia   Epidural abscess   Discitis of cervical region   Hardware complicating wound infection ([place])   Malnutrition of moderate degree    Monica Eaton is a 44 y.o. female with MSSA bacteremia with cervical spine discitis and epidural abscess status post neurosurgery with placement of hardware  #1 MSSA bacteremia with C-spine infection and epidural abscess:   2D echocardiogram does not show vegetations.    We will plan on at least 4-5 weeks of IV cefazolin and rifampin in-house, and then if she is continue to improve consider switch to oral therapy    She will need long-term oral antibiotics given placement of hardware.  2.  IV drug use needs a plan for this long-term.  She is HIV negative hep B and hep C negative  #3 IVDU:   She has had treatment at multiple treatment centers I think 5 in total that he told me.  Please call us back on August 1st or 6th (depending on how pt is doing to discuss options of switch to orals      LOS: 17 days   Alcide Evener 08/25/2019, 3:23 PM

## 2019-08-25 NOTE — Progress Notes (Signed)
Monica Eaton  ZDG:[id number] DOB: 09-09-1975 DOA: 08/07/2019 PCP: Patient, No Pcp Per    Brief Narrative:  44 year old with a history of IV drug abuse and tobacco abuse who presented with severe neck pain and upper back pain. In the ED she was found to have a CRP of 9.4 with a leukocytosis. 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  MRI was initially not able to be accomplished due to the patient's lack of cooperation.  She was transferred to [facility] and underwent MRI with general anesthesia.  Neurosurgery was consulted and the patient required surgical intervention 08/08/2019.  She was ultimately found to have a MSSA bacteremia for which ID was consulted.  At this time the plan is for her to complete 4 weeks of IV antibiotics as an inpatient.  Antimicrobials:  Cefazolin 7/5 > Rifampin 7/6 > Vancomycin 7/3 > 7/5  Subjective: Continues to c/o neck pain/back pain. No changes in character/nature of pain.   Assessment & Plan:  Acute MSSA cervical discitis/osteomyelitis - MSSA bacteremia POA - s/p C3-7 lami C3-6 PSIF Confirmed with MRI of cervical spine under general anesthesia -status post surgical intervention per Neurosurgery 7/2 - antibiotics being directed by Infectious Disease service w/ desire to complete 4-5 weeks of inpatient IV abx before considering discharge options - Flexeril for muscle spasms  IV drug abuse - polysubstance abuse Urine drug screen positive for benzodiazepines, opiates, and THC - patient is also a smoker  Moderate malnutrition   DVT prophylaxis: Lovenox Code Status: FULL CODE Family Communication: No family present at time of exam today Status is: Inpatient  Remains inpatient appropriate because:IV treatments appropriate due to intensity of illness or inability to take PO   Dispo: The patient is from: Home              Anticipated d/c is to: [place]               Anticipated d/c date is: > 3 days              Patient currently is not medically stable to d/c.   Consultants:  Neurosurgery ID   Objective: Blood pressure 112/77, pulse 98, temperature 99 F (37.2 C), temperature source Oral, resp. rate 18, height 5\' 7"  (1.702 m), weight 61.2 kg, SpO2 100 %.  Intake/Output Summary (Last 24 hours) at 08/25/2019 0939 Last data filed at 08/24/2019 2340 Gross per 24 hour  Intake 726.39 ml  Output --  Net 726.39 ml   Filed Weights   08/07/19 2205 08/08/19 1253 08/08/19 1823  Weight: 59 kg 59 kg 61.2 kg    Examination: General: NAD Lungs: CTA B Cardiovascular: RRR  Abdomen: NT/ND, soft, BS positive Extremities: No edema B lower extremities   CBC: Recent Labs  Lab 08/18/19 1021  WBC 13.3*  HGB 10.4*  HCT 32.2*  MCV 93.9  PLT 951*   Basic Metabolic Panel: Recent Labs  Lab 08/21/19 0500  NA 138  K 4.2  CL 99  CO2 29  GLUCOSE 109*  BUN 6  CREATININE 0.52  CALCIUM 9.2   GFR: Estimated Creatinine Clearance: 88.5 mL/min (by C-G formula based on SCr of 0.52 mg/dL).  Liver Function Tests: Recent Labs  Lab 08/21/19 0500  AST 91*  ALT 80*  ALKPHOS 118  BILITOT 0.1*  PROT 6.6  ALBUMIN 2.7*     Scheduled Meds: . (feeding supplement) PROSource Plus  30 mL Oral BID  BM  . Chlorhexidine Gluconate Cloth  6 each Topical Daily  . enoxaparin (LOVENOX) injection  40 mg Subcutaneous Q24H  . feeding supplement  1 Container Oral TID BM  . folic acid  1 mg Oral Daily  . morphine  45 mg Oral Q12H  . multivitamin with minerals  1 tablet Oral Daily  . nicotine  14 mg Transdermal Daily  . rifampin  300 mg Oral BID WC  . senna-docusate  1 tablet Oral BID  . sodium chloride flush  10-40 mL Intracatheter Q12H  . thiamine  100 mg Oral Daily   Continuous Infusions: . sodium chloride 10 mL/hr at 08/24/19 2340  .  ceFAZolin (ANCEF) IV 2 g (08/25/19 0433)     LOS: 17 days   Cherene Altes, MD [facility] Office   [phone] Pager - Text Page per Amion  If 7PM-7AM, please contact night-coverage per Amion 08/25/2019, 9:39 AM

## 2019-08-25 NOTE — Progress Notes (Signed)
PT Cancellation Note  Patient Details Name: Monica Eaton MRN: [id number] DOB: 02-06-1976   Cancelled Treatment:    Reason Eval/Treat Not Completed: Fatigue/lethargy limiting ability to participate;Other (comment); reports just walked with OT earlier.  Will attempt again another day.    [name] 08/25/2019, 3:47 PM Magda Kiel, PT Acute Rehabilitation Services Pager:[phone] Office:[phone] 08/25/2019

## 2019-08-25 NOTE — Progress Notes (Signed)
Occupational Therapy Treatment Patient Details Name: Monica Eaton MRN: [id number] DOB: 10/28/1975 Today's Date: 08/25/2019    History of present illness Monica Eaton is a 44 y.o. female with medical history significant for tobacco abuse and IV drug abuse who presents to the emergency department due to sudden onset of upper back pain which started about 3 -4 hours PTA.  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 Pt underwent, s/p C3-6 OLaminectomy & fusion.    OT comments  Pt making steady progress towards OT goals this session. Session focus on functional mobility as precursor to higher level BADLs. Pt required encouragment to participate but pt engaged in session once OOB. Overall, pt requires gross supervision to complete functional mobility ~ 250 ft with RW. Pt reports numbness in BLEs during ambulation with no LOB noted. Pt would continue to benefit from skilled occupational therapy while admitted and after d/c to address the below listed limitations in order to improve overall functional mobility and facilitate independence with BADL participation. DC plan remains appropriate, will follow acutely per POC.     Follow Up Recommendations  CIR    Equipment Recommendations  Other (comment) (TBD)    Recommendations for Other Services      Precautions / Restrictions Precautions Precautions: Cervical;Fall Precaution Booklet Issued: Yes (comment) Precaution Comments: reviewed precautions during mobility Required Braces or Orthoses: Cervical Brace Cervical Brace: Soft collar;For comfort Restrictions Other Position/Activity Restrictions: pt declining soft collar       Mobility Bed Mobility   Bed Mobility: Rolling;Sidelying to Sit Rolling: Supervision Sidelying to sit: Supervision       General bed mobility comments: no physical assist needed, + use of rail  Transfers Overall transfer level: Needs  assistance Equipment used: Rolling walker (2 wheeled) Transfers: Sit to/from Stand Sit to Stand: Min guard         General transfer comment: min guard for safety and line mgmt, pt sit<>stand x2 from EOB    Balance Overall balance assessment: Needs assistance Sitting-balance support: Feet supported;No upper extremity supported Sitting balance-Leahy Scale: Good Sitting balance - Comments: able to sit EOB for self feeding with no UE supported   Standing balance support: During functional activity;Bilateral upper extremity supported Standing balance-Leahy Scale: Poor Standing balance comment: reliant on BUE support in standing                           ADL either performed or assessed with clinical judgement   ADL Overall ADL's : Needs assistance/impaired Eating/Feeding: Set up;Sitting Eating/Feeding Details (indicate cue type and reason): able to open packets and complete self feeding EOB with set-up for positioning in order to maintain cervical precautions                     Toilet Transfer: Supervision/safety;RW Toilet Transfer Details (indicate cue type and reason): simulated via functional mobility with RW; gross supervision for safety         Functional mobility during ADLs: Supervision/safety;Rolling walker General ADL Comments: pt able to progress functional mobility distance ~ 250 ft with Rw and gross supervision     Vision       Perception     Praxis      Cognition Arousal/Alertness: Awake/alert Behavior During Therapy: WFL for tasks assessed/performed Overall Cognitive Status: Within Functional Limits for tasks assessed  General Comments: continues to perseverate on pain and needing pain meds at the exact time but overall WFL with pt participating in session with encouragement        Exercises     Shoulder Instructions       General Comments pt continues to decline to wear collar;  education on maintaining cervical precautions during mobility    Pertinent Vitals/ Pain       Pain Assessment: 0-10 Pain Score: 7  Pain Location: neck Pain Descriptors / Indicators: Grimacing;Guarding Pain Intervention(s): Monitored during session  Home Living                                          Prior Functioning/Environment              Frequency  Min 2X/week        Progress Toward Goals  OT Goals(current goals can now be found in the care plan section)  Progress towards OT goals: Progressing toward goals  Acute Rehab OT Goals Patient Stated Goal: decrease pain OT Goal Formulation: With patient Time For Goal Achievement: 08/28/19 Potential to Achieve Goals: Good  Plan Discharge plan remains appropriate;Frequency remains appropriate    Co-evaluation                 AM-PAC OT "6 Clicks" Daily Activity     Outcome Measure   Help from another person eating meals?: A Little Help from another person taking care of personal grooming?: A Little Help from another person toileting, which includes using toliet, bedpan, or urinal?: A Little Help from another person bathing (including washing, rinsing, drying)?: A Little Help from another person to put on and taking off regular upper body clothing?: A Little Help from another person to put on and taking off regular lower body clothing?: A Lot 6 Click Score: 17    End of Session Equipment Utilized During Treatment: Rolling walker  OT Visit Diagnosis: Unsteadiness on feet (R26.81);Pain;Muscle weakness (generalized) (M62.81);Other abnormalities of gait and mobility (R26.89);Other symptoms and signs involving the nervous system (R29.898)   Activity Tolerance Patient tolerated treatment well   Patient Left in bed;with call bell/phone within reach;Other (comment) (sitting EOB eating lunch)   Nurse Communication Mobility status        Time: 3546-5681 OT Time Calculation (min): 15  min  Charges: OT General Charges $OT Visit: 1 Visit OT Treatments $Self Care/Home Management : 8-22 mins  Lanier Clam., COTA/L Acute Rehabilitation Services [phone]    Ihor Gully 08/25/2019, 1:24 PM

## 2019-08-26 NOTE — Progress Notes (Signed)
Monica Eaton  UKG:[id number] DOB: 1976-02-01 DOA: 08/07/2019 PCP: Patient, No Pcp Per    Brief Narrative:  44 year old with a history of IV drug abuse and tobacco abuse who presented with severe neck pain and upper back pain. In the ED she was found to have a CRP of 9.4 with a leukocytosis. 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  MRI was initially not able to be accomplished due to the patient's lack of cooperation.  She was transferred to [facility] and underwent MRI with general anesthesia.  Neurosurgery was consulted and the patient required surgical intervention 08/08/2019.  She was ultimately found to have a MSSA bacteremia for which ID was consulted.  At this time the plan is for her to complete 4 weeks of IV antibiotics as an inpatient.  Antimicrobials:  Cefazolin 7/5 > Rifampin 7/6 > Vancomycin 7/3 > 7/5  Subjective: Continues to c/o neck pain/back pain.  No new complaints.  Working with therapy at the time of my evaluation.  Neurologically intact.  Assessment & Plan:  Acute MSSA cervical discitis/osteomyelitis - MSSA bacteremia POA - s/p C3-7 lami C3-6 PSIF Confirmed with MRI of cervical spine under general anesthesia -status post surgical intervention per Neurosurgery 7/2 - antibiotics being directed by Infectious Disease service w/ desire to complete 4-5 weeks of inpatient IV abx before considering discharge options - Flexeril for muscle spasms  IV drug abuse - polysubstance abuse Urine drug screen positive for benzodiazepines, opiates, and THC - patient is also a smoker  Moderate malnutrition   DVT prophylaxis: Lovenox Code Status: FULL CODE Family Communication: No family present at time of exam today Status is: Inpatient  Remains inpatient appropriate because:IV treatments appropriate due to intensity of illness or inability to take PO   Dispo: The patient is from: Home               Anticipated d/c is to: [place]              Anticipated d/c date is: > 3 days              Patient currently is not medically stable to d/c.   Consultants:  Neurosurgery ID   Objective: Blood pressure 105/65, pulse 90, temperature 98.2 F (36.8 C), temperature source Oral, resp. rate 15, height 5\' 7"  (1.702 m), weight 61.2 kg, SpO2 100 %. No intake or output data in the 24 hours ending 08/26/19 1049 Filed Weights   08/07/19 2205 08/08/19 1253 08/08/19 1823  Weight: 59 kg 59 kg 61.2 kg    Examination: General: NAD Lungs: CTA B Cardiovascular: RRR  Abdomen: NT/ND, soft, BS positive Extremities: No edema BLE  CBC: No results for input(s): WBC, NEUTROABS, HGB, HCT, MCV, PLT in the last 168 hours.   Basic Metabolic Panel: Recent Labs  Lab 08/21/19 0500  NA 138  K 4.2  CL 99  CO2 29  GLUCOSE 109*  BUN 6  CREATININE 0.52  CALCIUM 9.2   GFR: Estimated Creatinine Clearance: 88.5 mL/min (by C-G formula based on SCr of 0.52 mg/dL).  Liver Function Tests: Recent Labs  Lab 08/21/19 0500  AST 91*  ALT 80*  ALKPHOS 118  BILITOT 0.1*  PROT 6.6  ALBUMIN 2.7*     Scheduled Meds:  Chlorhexidine Gluconate Cloth  6 each Topical Daily   enoxaparin (LOVENOX) injection  40 mg Subcutaneous Q24H   feeding supplement  1 Container Oral TID BM  folic acid  1 mg Oral Daily   morphine  45 mg Oral Q12H   multivitamin with minerals  1 tablet Oral Daily   nicotine  14 mg Transdermal Daily   rifampin  300 mg Oral BID WC   senna-docusate  1 tablet Oral BID   sodium chloride flush  10-40 mL Intracatheter Q12H   thiamine  100 mg Oral Daily   Continuous Infusions:  sodium chloride 10 mL/hr at 08/24/19 2340    ceFAZolin (ANCEF) IV 2 g (08/26/19 0540)     LOS: 18 days   Cherene Altes, MD [facility] Office  [phone] Pager - Text Page per Shea Evans  If 7PM-7AM, please contact night-coverage per Amion 08/26/2019, 10:49 AM

## 2019-08-26 NOTE — Plan of Care (Signed)
Pt alert and oriented. Educated on preparing body to get use to PO pain meds and not IV meds. Utilized ice pack to help with pain. Pain level has stayed between a 8-9/10. All PRN medications utilized. Incision clean and intact. Problem: Education: Goal: Knowledge of General Education information will improve Description: Including pain rating scale, medication(s)/side effects and non-pharmacologic comfort measures Outcome: Progressing   Problem: Health Behavior/Discharge Planning: Goal: Ability to manage health-related needs will improve Outcome: Progressing   Problem: Clinical Measurements: Goal: Ability to maintain clinical measurements within normal limits will improve Outcome: Progressing Goal: Will remain free from infection Outcome: Progressing Goal: Diagnostic test results will improve Outcome: Progressing Goal: Respiratory complications will improve Outcome: Progressing Goal: Cardiovascular complication will be avoided Outcome: Progressing   Problem: Activity: Goal: Risk for activity intolerance will decrease Outcome: Progressing   Problem: Nutrition: Goal: Adequate nutrition will be maintained Outcome: Progressing   Problem: Coping: Goal: Level of anxiety will decrease Outcome: Progressing   Problem: Elimination: Goal: Will not experience complications related to bowel motility Outcome: Progressing Goal: Will not experience complications related to urinary retention Outcome: Progressing   Problem: Pain Managment: Goal: General experience of comfort will improve Outcome: Progressing   Problem: Safety: Goal: Ability to remain free from injury will improve Outcome: Progressing   Problem: Skin Integrity: Goal: Risk for impaired skin integrity will decrease Outcome: Progressing

## 2019-08-26 NOTE — Progress Notes (Signed)
Physical Therapy Treatment Patient Details Name: Monica Eaton MRN: [id number] DOB: 1975-10-17 Today's Date: 08/26/2019    History of Present Illness Monica Eaton is a 44 y.o. female with medical history significant for tobacco abuse and IV drug abuse who presents to the emergency department due to sudden onset of upper back pain which started about 3 -4 hours PTA.  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 Pt underwent, s/p C3-6 OLaminectomy & fusion. Pt is remaining hospitalized for IV antibiotics.    PT Comments    Pt making good progress.  She was able to ambulate 300' with RW and perform stairs.  Required min cues for safe techniques and cervical precautions.  Will decrease frequency due to >2 weeks post op and pt progressing well.  Additionally, goals have been modified/extended to reflect good progress.    Follow Up Recommendations  Home health PT;Supervision/Assistance - 24 hour     Equipment Recommendations  Rolling walker with 5" wheels    Recommendations for Other Services       Precautions / Restrictions Precautions Precautions: Cervical;Fall Precaution Booklet Issued: Yes (comment) Precaution Comments: reviewed precautions during mobility Required Braces or Orthoses: Cervical Brace Cervical Brace: Soft collar;For comfort Restrictions Other Position/Activity Restrictions: pt declining soft collar    Mobility  Bed Mobility Overal bed mobility: Needs Assistance Bed Mobility: Rolling;Sidelying to Sit Rolling: Supervision Sidelying to sit: Supervision       General bed mobility comments: no physical assist needed, + use of rail  Transfers Overall transfer level: Needs assistance Equipment used: Rolling walker (2 wheeled) Transfers: Sit to/from Stand Sit to Stand: Min guard         General transfer comment: min guard for safety and line mgmt, pt sit<>stand x2 from  EOB  Ambulation/Gait Ambulation/Gait assistance: Min guard Gait Distance (Feet): 300 Feet Assistive device: Rolling walker (2 wheeled) Gait Pattern/deviations: Step-through pattern;Decreased stride length;Decreased dorsiflexion - right;Decreased dorsiflexion - left Gait velocity: decreased   General Gait Details: cues for increased heel strike   Stairs Stairs: Yes Stairs assistance: Min guard Stair Management: Two rails;Step to pattern Number of Stairs: 5 General stair comments: min guard for safety; pt performed stairs cautiously   Wheelchair Mobility    Modified Rankin (Stroke Patients Only)       Balance Overall balance assessment: Needs assistance Sitting-balance support: Feet supported;No upper extremity supported Sitting balance-Leahy Scale: Good       Standing balance-Leahy Scale: Fair Standing balance comment: Pt was able to lift UE from RW at times (to don mask, comb hair)                            Cognition Arousal/Alertness: Awake/alert Behavior During Therapy: WFL for tasks assessed/performed Overall Cognitive Status: Within Functional Limits for tasks assessed                                        Exercises      General Comments General comments (skin integrity, edema, etc.): VSS; education on transfer techniques, nerve recovery, and cervical precautions      Pertinent Vitals/Pain Pain Assessment: 0-10 Pain Score: 5  Pain Location: neck Pain Descriptors / Indicators: Guarding Pain Intervention(s): Limited activity within patient's tolerance;Monitored during session;Premedicated before session (Pt received oral pain meds 1 hr prior to PT and IV dilaudid just  prior to PT)    Home Living                      Prior Function            PT Goals (current goals can now be found in the care plan section) Acute Rehab PT Goals Patient Stated Goal: decrease pain PT Goal Formulation: With patient Time For Goal  Achievement: 08/27/19 Potential to Achieve Goals: Good Progress towards PT goals: Progressing toward goals    Frequency    Min 3X/week      PT Plan Frequency needs to be updated    Co-evaluation              AM-PAC PT "6 Clicks" Mobility   Outcome Measure  Help needed turning from your back to your side while in a flat bed without using bedrails?: None Help needed moving from lying on your back to sitting on the side of a flat bed without using bedrails?: None Help needed moving to and from a bed to a chair (including a wheelchair)?: None Help needed standing up from a chair using your arms (e.g., wheelchair or bedside chair)?: None Help needed to walk in hospital room?: A Little Help needed climbing 3-5 steps with a railing? : A Little 6 Click Score: 22    End of Session Equipment Utilized During Treatment: Gait belt Activity Tolerance: Patient tolerated treatment well Patient left: with call bell/phone within reach;with chair alarm set;in chair Nurse Communication: Mobility status PT Visit Diagnosis: Unsteadiness on feet (R26.81);Difficulty in walking, not elsewhere classified (R26.2)     Time: 1200-1230 PT Time Calculation (min) (ACUTE ONLY): 30 min  Charges:  $Gait Training: 8-22 mins $Therapeutic Activity: 8-22 mins                     Abran Richard, PT Acute Rehab Services Pager [phone] [facility] [place] 08/26/2019, 1:34 PM

## 2019-08-27 LAB — COMPREHENSIVE METABOLIC PANEL
ALT: 161 U/L — ABNORMAL HIGH (ref 0–44)
AST: 226 U/L — ABNORMAL HIGH (ref 15–41)
Albumin: 2.7 g/dL — ABNORMAL LOW (ref 3.5–5.0)
Alkaline Phosphatase: 157 U/L — ABNORMAL HIGH (ref 38–126)
Anion gap: 7 (ref 5–15)
BUN: 6 mg/dL (ref 6–20)
CO2: 31 mmol/L (ref 22–32)
Calcium: 9.3 mg/dL (ref 8.9–10.3)
Chloride: 100 mmol/L (ref 98–111)
Creatinine, Ser: 0.56 mg/dL (ref 0.44–1.00)
GFR calc Af Amer: >60 mL/min (ref >60)
GFR calc non Af Amer: >60 mL/min (ref >60)
Glucose, Bld: 111 mg/dL — ABNORMAL HIGH (ref 70–99)
Potassium: 4.5 mmol/L (ref 3.5–5.1)
Sodium: 138 mmol/L (ref 135–145)
Total Bilirubin: 0.5 mg/dL (ref 0.3–1.2)
Total Protein: 6.2 g/dL — ABNORMAL LOW (ref 6.5–8.1)

## 2019-08-27 LAB — CBC
HCT: 32.8 % — ABNORMAL LOW (ref 36.0–46.0)
Hemoglobin: 10.6 g/dL — ABNORMAL LOW (ref 12.0–15.0)
MCH: 30.2 pg (ref 26.0–34.0)
MCHC: 32.3 g/dL (ref 30.0–36.0)
MCV: 93.4 fL (ref 80.0–100.0)
Platelets: 605 10*3/uL — ABNORMAL HIGH (ref 150–400)
RBC: 3.51 MIL/uL — ABNORMAL LOW (ref 3.87–5.11)
RDW: 12.8 % (ref 11.5–15.5)
WBC: 4.4 10*3/uL (ref 4.0–10.5)
nRBC: 0 % (ref 0.0–0.2)

## 2019-08-27 NOTE — Progress Notes (Signed)
PROGRESS NOTE    Monica Eaton  DJM:[id number] DOB: 11-Sep-1975 DOA: 08/07/2019 PCP: Patient, No Pcp Per    Chief Complaint  Patient presents with  . Back Pain    Brief Narrative:   44 year old with a history of IV drug abuse and tobacco abuse who presented with severe neck pain and upper back pain. In the ED she was found to have a CRP of 9.4 with a leukocytosis. 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  MRI was initially not able to be accomplished due to the patient's lack of cooperation.  She was transferred to [facility] and underwent MRI with general anesthesia.  Neurosurgery was consulted and the patient required surgical intervention 08/08/2019.  She was ultimately found to have a MSSA bacteremia for which ID was consulted.  At this time the plan is for her to complete 4 weeks of IV antibiotics as an inpatient.  Subjective: No interval changes, tolerating iv abx  Assessment & Plan:   Principal Problem:   Discitis of cervical region Active Problems:   Cervical pain (neck)   IVDU (intravenous drug user)   Tobacco abuse   Neck pain   Bandemia   MSSA bacteremia   Epidural abscess   Hardware complicating wound infection ([place])   Malnutrition of moderate degree   Acute MSSA cervical discitis/osteomyelitis - MSSA bacteremia POA - s/p C3-7 lami C3-6 PSIF Confirmed with MRI of cervical spine under general anesthesia -status post surgical intervention per Neurosurgery 7/2 - antibiotics being directed by Infectious Disease service w/ desire to complete 4-5 weeks of inpatient IV abx before considering discharge options - Flexeril for muscle spasms  IV drug abuse - polysubstance abuse Urine drug screen positive for benzodiazepines, opiates, and THC - patient is also a smoker  Moderate malnutrition  She declined boost supplement Nutrition Status: Nutrition Problem: Moderate  Malnutrition Etiology: social / environmental circumstances Signs/Symptoms: mild fat depletion, mild muscle depletion, moderate muscle depletion, energy intake < 75% for > or equal to 3 months Interventions: MVI, Boost Breeze, Prostat   Cigarette smoking Smoking cessation education provided, continue nicotine patch  DVT prophylaxis: enoxaparin (LOVENOX) injection 40 mg Start: 08/08/19 1800 SCDs Start: 08/08/19 0616   Code Status: Full Family Communication: Patient Disposition:   Status is: Inpatient     Dispo: The patient is from: Home              Anticipated d/c is to: Home              Anticipated d/c date is: Need to finish IV antibiotic while in the hospital                Consultants:   Infectious disease  Neurosurgery Dr. Venetia Constable on July 2  CIR  Procedures:  Cervical six LAMINECTOMY AND  FUSION (N/A) oon 7/2 by Dr. Venetia Constable  Midline placement  On July 14  Antimicrobials:   Cefazolin 7/5 > Rifampin 7/6 > Vancomycin 7/3 > 7/5    Objective: Vitals:   08/26/19 1513 08/26/19 1928 08/26/19 2317 08/27/19 0808  BP: 109/69 102/66 114/74 107/72  Pulse: 92 (!) 103 92 91  Resp: 17 17 18 18   Temp: 98.3 F (36.8 C) 97.6 F (36.4 C) 98.2 F (36.8 C) (!) 97.5 F (36.4 C)  TempSrc: Oral Oral Oral Oral  SpO2: 100% 100% 99% 97%  Weight:      Height:       No  intake or output data in the 24 hours ending 08/27/19 1124 Filed Weights   08/07/19 2205 08/08/19 1253 08/08/19 1823  Weight: 59 kg 59 kg 61.2 kg    Examination:  General exam: thin, calm, NAD Respiratory system: Clear to auscultation. Respiratory effort normal. Cardiovascular system: S1 & S2 heard, RRR. No JVD, no murmur, No pedal edema. Gastrointestinal system: Abdomen is nondistended, soft and nontender. No organomegaly or masses felt. Normal bowel sounds heard. Central nervous system: Alert and oriented. No focal neurological deficits. Extremities: Symmetric 5 x 5 power. Skin: No rashes,  lesions or ulcers Psychiatry: Flat affect, calm and cooperative    Data Reviewed: I have personally reviewed following labs and imaging studies  CBC: Recent Labs  Lab 08/27/19 0417  WBC 4.4  HGB 10.6*  HCT 32.8*  MCV 93.4  PLT 605*    Basic Metabolic Panel: Recent Labs  Lab 08/21/19 0500 08/27/19 0417  NA 138 138  K 4.2 4.5  CL 99 100  CO2 29 31  GLUCOSE 109* 111*  BUN 6 6  CREATININE 0.52 0.56  CALCIUM 9.2 9.3    GFR: Estimated Creatinine Clearance: 88.5 mL/min (by C-G formula based on SCr of 0.56 mg/dL).  Liver Function Tests: Recent Labs  Lab 08/21/19 0500 08/27/19 0417  AST 91* 226*  ALT 80* 161*  ALKPHOS 118 157*  BILITOT 0.1* 0.5  PROT 6.6 6.2*  ALBUMIN 2.7* 2.7*    CBG: No results for input(s): GLUCAP in the last 168 hours.   No results found for this or any previous visit (from the past 240 hour(s)).       Radiology Studies: No results found.      Scheduled Meds: . Chlorhexidine Gluconate Cloth  6 each Topical Daily  . enoxaparin (LOVENOX) injection  40 mg Subcutaneous Q24H  . feeding supplement  1 Container Oral TID BM  . folic acid  1 mg Oral Daily  . morphine  45 mg Oral Q12H  . multivitamin with minerals  1 tablet Oral Daily  . nicotine  14 mg Transdermal Daily  . rifampin  300 mg Oral BID WC  . senna-docusate  1 tablet Oral BID  . sodium chloride flush  10-40 mL Intracatheter Q12H  . thiamine  100 mg Oral Daily   Continuous Infusions: . sodium chloride 10 mL/hr at 08/24/19 2340  .  ceFAZolin (ANCEF) IV 2 g (08/27/19 4166)     LOS: 19 days     Time spent: 14mins I have personally reviewed and interpreted on  08/27/2019 daily labs, tele strips, imagings as discussed above under date review session and assessment and plans.  I reviewed all nursing notes, pharmacy notes, consultant notes,  vitals, pertinent old records  I have discussed plan of care as described above with RN , patient  on 08/27/2019  Voice  Recognition /Dragon dictation system was used to create this note, attempts have been made to correct errors. Please contact the author with questions and/or clarifications.   Florencia Reasons, MD PhD FACP [facility]  Available via Epic secure chat 7am-7pm for nonurgent issues Please page for urgent issues To page the attending provider between 7A-7P or the covering provider during after hours 7P-7A, please log into the web site [email or web address] and access using universal [place] password for that web site. If you do not have the password, please call the hospital operator.    08/27/2019, 11:24 AM

## 2019-08-27 NOTE — Plan of Care (Signed)
Pt alert and oriented. Pt is on RA. Incision is clean and intact. Incision has some swelling. Pt slept more throughout the night. Pain controlled with PRN medications. Problem: Education: Goal: Knowledge of General Education information will improve Description: Including pain rating scale, medication(s)/side effects and non-pharmacologic comfort measures Outcome: Progressing   Problem: Health Behavior/Discharge Planning: Goal: Ability to manage health-related needs will improve Outcome: Progressing   Problem: Clinical Measurements: Goal: Ability to maintain clinical measurements within normal limits will improve Outcome: Progressing Goal: Will remain free from infection Outcome: Progressing Goal: Diagnostic test results will improve Outcome: Progressing Goal: Respiratory complications will improve Outcome: Progressing Goal: Cardiovascular complication will be avoided Outcome: Progressing   Problem: Activity: Goal: Risk for activity intolerance will decrease Outcome: Progressing   Problem: Nutrition: Goal: Adequate nutrition will be maintained Outcome: Progressing   Problem: Coping: Goal: Level of anxiety will decrease Outcome: Progressing   Problem: Elimination: Goal: Will not experience complications related to bowel motility Outcome: Progressing Goal: Will not experience complications related to urinary retention Outcome: Progressing   Problem: Pain Managment: Goal: General experience of comfort will improve Outcome: Progressing   Problem: Safety: Goal: Ability to remain free from injury will improve Outcome: Progressing   Problem: Skin Integrity: Goal: Risk for impaired skin integrity will decrease Outcome: Progressing

## 2019-08-28 ENCOUNTER — Inpatient Hospital Stay (HOSPITAL_COMMUNITY): Payer: Medicaid Other

## 2019-08-28 DIAGNOSIS — R7989 Other specified abnormal findings of blood chemistry: Secondary | ICD-10-CM

## 2019-08-28 DIAGNOSIS — E440 Moderate protein-calorie malnutrition: Secondary | ICD-10-CM

## 2019-08-28 DIAGNOSIS — D473 Essential (hemorrhagic) thrombocythemia: Secondary | ICD-10-CM

## 2019-08-28 NOTE — Plan of Care (Signed)
Pt alert and oriented. Pt is on RA. Incision to the posterior neck is clean and intact. Pain controlled with PRN meds. Problem: Education: Goal: Knowledge of General Education information will improve Description: Including pain rating scale, medication(s)/side effects and non-pharmacologic comfort measures Outcome: Progressing   Problem: Health Behavior/Discharge Planning: Goal: Ability to manage health-related needs will improve Outcome: Progressing   Problem: Clinical Measurements: Goal: Ability to maintain clinical measurements within normal limits will improve Outcome: Progressing Goal: Will remain free from infection Outcome: Progressing Goal: Diagnostic test results will improve Outcome: Progressing Goal: Respiratory complications will improve Outcome: Progressing Goal: Cardiovascular complication will be avoided Outcome: Progressing   Problem: Activity: Goal: Risk for activity intolerance will decrease Outcome: Progressing   Problem: Nutrition: Goal: Adequate nutrition will be maintained Outcome: Progressing   Problem: Coping: Goal: Level of anxiety will decrease Outcome: Progressing   Problem: Elimination: Goal: Will not experience complications related to bowel motility Outcome: Progressing Goal: Will not experience complications related to urinary retention Outcome: Progressing   Problem: Pain Managment: Goal: General experience of comfort will improve Outcome: Progressing   Problem: Safety: Goal: Ability to remain free from injury will improve Outcome: Progressing   Problem: Skin Integrity: Goal: Risk for impaired skin integrity will decrease Outcome: Progressing

## 2019-08-28 NOTE — Progress Notes (Signed)
Physical Therapy Treatment Patient Details Name: Monica Eaton MRN: [id number] DOB: [birth year] Today's Date: 08/28/2019    History of Present Illness Monica Eaton is a 44 y.o. female with medical history significant for tobacco abuse and IV drug abuse who presents to the emergency department due to sudden onset of upper back pain which started about 3 -4 hours PTA.  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 Pt underwent, s/p C3-6 OLaminectomy & fusion. Pt is remaining hospitalized for IV antibiotics.    PT Comments    Pt in bed upon arrival of PT, reports she is too fatigued and in pain to participate, but eventually agreeable to limited session. The pt was able to complete two short bouts of ambulation in her room, once with RW and minG for safety and once with minA through HHA of 1. The pt demos good stability with and without the walker, but currently uses significantly slowed gait speed without RW and will benefit from skilled PT to continue to progress functional stability and endurance without use of AD.    Follow Up Recommendations  Home health PT;Supervision/Assistance - 24 hour     Equipment Recommendations  Rolling walker with 5" wheels    Recommendations for Other Services       Precautions / Restrictions Precautions Precautions: Cervical;Fall Precaution Booklet Issued: Yes (comment) Precaution Comments: reviewed precautions during mobility Required Braces or Orthoses: Cervical Brace Cervical Brace: Soft collar;For comfort Restrictions Other Position/Activity Restrictions: pt not wearing sofl collar today    Mobility  Bed Mobility Overal bed mobility: Modified Independent Bed Mobility: Rolling;Sidelying to Sit;Sit to Sidelying Rolling: Supervision Sidelying to sit: Supervision     Sit to sidelying: Supervision General bed mobility comments: no physical assist needed, + use of rail and HOB  up  Transfers Overall transfer level: Needs assistance Equipment used: Rolling walker (2 wheeled);None Transfers: Sit to/from Stand Sit to Stand: Supervision         General transfer comment: pt requesting RW to mobilize, but able to transfer without AD without LOB  Ambulation/Gait Ambulation/Gait assistance: Min guard Gait Distance (Feet): 20 Feet (x2) Assistive device: Rolling walker (2 wheeled);1 person hand held assist Gait Pattern/deviations: Step-through pattern;Decreased stride length;Decreased dorsiflexion - right;Decreased dorsiflexion - left Gait velocity: decreased   General Gait Details: Pt with good management of RW in tight space, able to reposittion as needed. Pt then asked to complete short bout of ambulation in room without AD with HHA of 1 for pt comfort, no LOB but pt with slowed gait for increased stability       Balance Overall balance assessment: Needs assistance Sitting-balance support: No upper extremity supported;Feet supported Sitting balance-Leahy Scale: Good Sitting balance - Comments: able to sit EOB without UE, supervision only   Standing balance support: During functional activity;Single extremity supported Standing balance-Leahy Scale: Fair Standing balance comment: pt able to ambulate with minA through HHA of 1, prefers BUE support during mobility                            Cognition Arousal/Alertness: Awake/alert Behavior During Therapy: WFL for tasks assessed/performed Overall Cognitive Status: Within Functional Limits for tasks assessed                                 General Comments: pt continues to report significant pain but generally [facility] for  participating in session with good awareness of safety and functional use of precautions with mobility      Exercises      General Comments General comments (skin integrity, edema, etc.): VSS on RA, pt reports she is limited due to sig pain and fatigue as she is NPO  currently for ultrasound this afternoon      Pertinent Vitals/Pain Pain Assessment: Faces Faces Pain Scale: Hurts whole lot Pain Location: neck, arms/hands Pain Descriptors / Indicators: Grimacing;Pins and needles;Stabbing Pain Intervention(s): Limited activity within patient's tolerance;Patient requesting pain meds-RN notified;Repositioned           PT Goals (current goals can now be found in the care plan section) Acute Rehab PT Goals Patient Stated Goal: decrease pain PT Goal Formulation: With patient Time For Goal Achievement: 09/11/19 Potential to Achieve Goals: Good Progress towards PT goals: Progressing toward goals    Frequency    Min 3X/week      PT Plan Current plan remains appropriate       AM-PAC PT "6 Clicks" Mobility   Outcome Measure  Help needed turning from your back to your side while in a flat bed without using bedrails?: None Help needed moving from lying on your back to sitting on the side of a flat bed without using bedrails?: None Help needed moving to and from a bed to a chair (including a wheelchair)?: None Help needed standing up from a chair using your arms (e.g., wheelchair or bedside chair)?: None Help needed to walk in hospital room?: A Little Help needed climbing 3-5 steps with a railing? : A Little 6 Click Score: 22    End of Session Equipment Utilized During Treatment: Gait belt Activity Tolerance: Patient tolerated treatment well Patient left: with call bell/phone within reach;with chair alarm set;in chair Nurse Communication: Mobility status PT Visit Diagnosis: Unsteadiness on feet (R26.81);Difficulty in walking, not elsewhere classified (R26.2)     Time: 5102-5852 PT Time Calculation (min) (ACUTE ONLY): 15 min  Charges:  $Gait Training: 8-22 mins                     Karma Ganja, PT, DPT   Acute Rehabilitation Department Pager #: [phone]   Otho Bellows 08/28/2019, 4:35 PM

## 2019-08-28 NOTE — Progress Notes (Signed)
Occupational Therapy Treatment Patient Details Name: Monica Eaton MRN: [id number] DOB: 07-13-1975 Today's Date: 08/28/2019    History of present illness Martyna Thorns is a 44 y.o. female with medical history significant for tobacco abuse and IV drug abuse who presents to the emergency department due to sudden onset of upper back pain which started about 3 -4 hours PTA.  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 Pt underwent, s/p C3-6 OLaminectomy & fusion. Pt is remaining hospitalized for IV antibiotics.   OT comments  This 44 yo female admitted with above presents to acute OT making progress with mobility but still needing increased A for LBADLs due to pain in neck with attempting to doff/donn socks and pt with decreased strength overall in arms (3-4/5 elbows distally). She will continue to benefit from acute OT with follow up [place]. Goals revised this session.  Follow Up Recommendations  Home health OT    Equipment Recommendations  Other (comment) (TBD)       Precautions / Restrictions Precautions Precautions: Cervical;Fall Required Braces or Orthoses: Cervical Brace Cervical Brace: Soft collar;For comfort Restrictions Other Position/Activity Restrictions: pt not wearing sofl collar today       Mobility Bed Mobility Overal bed mobility: Modified Independent Bed Mobility: Rolling;Sidelying to Sit           General bed mobility comments: no physical assist needed, + use of rail and HOB up  Transfers Overall transfer level: Needs assistance Equipment used: Rolling walker (2 wheeled) Transfers: Sit to/from Stand Sit to Stand: Supervision         General transfer comment: ambulating pt is min guard A    Balance Overall balance assessment: Needs assistance Sitting-balance support: No upper extremity supported;Feet supported Sitting balance-Leahy Scale: Good     Standing balance support: During  functional activity;Single extremity supported Standing balance-Leahy Scale: Poor Standing balance comment: pt could take one hand off of RW intermittently to adjust mask                           ADL either performed or assessed with clinical judgement   ADL Overall ADL's : Needs assistance/impaired               Lower Body Bathing Details (indicate cue type and reason): Pt cannot easily get to her feet (even by crossing her legs) without increased pain in neck       Lower Body Dressing Details (indicate cue type and reason): Pt cannot easily get to her feet (even by crossing her legs) without increased pain in neck Toilet Transfer: Supervision/safety;RW Toilet Transfer Details (indicate cue type and reason): simulated bed>walk in hallway>back to bed           General ADL Comments: Asked pt about how she would feel about ambulating in room without RW as long as therapy was with her.Her concern is that her legs feel numb (feet did feel this way, but she is able to feel her feet now) as well as her hands/arms. Pt can feel her legs/arms when touched they just don't feel normal.     Vision Patient Visual Report: No change from baseline            Cognition Arousal/Alertness: Awake/alert Behavior During Therapy: WFL for tasks assessed/performed Overall Cognitive Status: Within Functional Limits for tasks assessed  Exercises  Pt with reports of pins and needles in arms/hands--feel numb. She is 3-4/5 elbows distally and would benefit from Bil UE exercises/activites.           Pertinent Vitals/ Pain       Pain Assessment: Faces Pain Score: 7  Pain Location: neck, arms/hands Pain Descriptors / Indicators: Grimacing;Pins and needles Pain Intervention(s): Limited activity within patient's tolerance;Monitored during session;Premedicated before session         Frequency  Min 2X/week         Progress Toward Goals  OT Goals(current goals can now be found in the care plan section)  Progress towards OT goals: Progressing toward goals  Acute Rehab OT Goals Patient Stated Goal: decrease pain OT Goal Formulation: With patient Time For Goal Achievement: 09/11/19 Potential to Achieve Goals: Good  Plan Discharge plan needs to be updated;Frequency remains appropriate       AM-PAC OT "6 Clicks" Daily Activity     Outcome Measure   Help from another person eating meals?: None Help from another person taking care of personal grooming?: A Little Help from another person toileting, which includes using toliet, bedpan, or urinal?: A Little Help from another person bathing (including washing, rinsing, drying)?: A Little Help from another person to put on and taking off regular upper body clothing?: A Little Help from another person to put on and taking off regular lower body clothing?: A Lot 6 Click Score: 18    End of Session Equipment Utilized During Treatment: Rolling walker  OT Visit Diagnosis: Unsteadiness on feet (R26.81);Pain;Muscle weakness (generalized) (M62.81);Other abnormalities of gait and mobility (R26.89);Other symptoms and signs involving the nervous system (R29.898) Pain - part of body:  (neck and Bil arms)   Activity Tolerance Patient tolerated treatment well   Patient Left in bed;with call bell/phone within reach   Nurse Communication  (Pt back in room and IV needs to be reconnected (called the front desk to ask for RN))        Time: 9826-4158 OT Time Calculation (min): 29 min  Charges: OT General Charges $OT Visit: 1 Visit OT Treatments $Self Care/Home Management : 23-37 mins  [place], OTR/L Acute [employer] Pager [phone] Office [phone]      Almon Register 08/28/2019, 9:12 AM

## 2019-08-28 NOTE — Progress Notes (Signed)
PROGRESS NOTE    Sheral Pfahler  VXB:[id number] DOB: 01-May-1975 DOA: 08/07/2019 PCP: Patient, No Pcp Per    Chief Complaint  Patient presents with  . Back Pain    Brief Narrative:   44 year old with a history of IV drug abuse and tobacco abuse who presented with severe neck pain and upper back pain. In the ED she was found to have a CRP of 9.4 with a leukocytosis. 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  MRI was initially not able to be accomplished due to the patient's lack of cooperation.  She was transferred to [facility] and underwent MRI with general anesthesia.  Neurosurgery was consulted and the patient required surgical intervention 08/08/2019.  She was ultimately found to have a MSSA bacteremia for which ID was consulted.  At this time the plan is for her to complete 4 weeks of IV antibiotics as an inpatient.  Subjective: Reports neck and  back pain . no significant change from before,   No fever, T-max 99.2, wbc normalized lft worsening , denies abdominal pain, no nausea no vomiting.  Assessment & Plan:   Principal Problem:   Discitis of cervical region Active Problems:   Cervical pain (neck)   IVDU (intravenous drug user)   Tobacco abuse   Neck pain   Bandemia   MSSA bacteremia   Epidural abscess   Hardware complicating wound infection ([place])   Malnutrition of moderate degree   Acute MSSA cervical discitis/osteomyelitis - MSSA bacteremia POA  - s/p C3-7 lami C3-6 PSIF Confirmed with MRI of cervical spine under general anesthesia -status post surgical intervention per Neurosurgery 7/2 -  antibiotics being directed by Infectious Disease service w/ desire to complete 4-5 weeks of inpatient IV abx before considering discharge options   Elevation of LFT: We will get liver ultrasound Discussed with infectious disease Dr. Drucilla Schmidt for recommend stop rifampin  Back pain and neck  pain: She is on MS Contin 40 mg twice a day, she required as needed Dilaudid 5 times since midnight, Flexeril twice since midnight Continue stool softener  Thrombocytosis, likely reactive Appear has peaked at 780, now trending down  Normocytic anemia from acute illness -Hemoglobin around 10-11  IV drug abuse - polysubstance abuse Urine drug screen positive for benzodiazepines, opiates, and THC - patient is also a smoker  Moderate malnutrition  She declined boost supplement Nutrition Status: Nutrition Problem: Moderate Malnutrition Etiology: social / environmental circumstances Signs/Symptoms: mild fat depletion, mild muscle depletion, moderate muscle depletion, energy intake < 75% for > or equal to 3 months Interventions: MVI, Boost Breeze, Prostat   Cigarette smoking Smoking cessation education provided, continue nicotine patch  History of alcohol use, reports quit prior to coming to the hospital  DVT prophylaxis: enoxaparin (LOVENOX) injection 40 mg Start: 08/08/19 1800 SCDs Start: 08/08/19 0616   Code Status: Full Family Communication: Patient Disposition:   Status is: Inpatient     Dispo: The patient is from: Home              Anticipated d/c is to: Home              Anticipated d/c date is: Need to finish IV antibiotic while in the hospital                Consultants:   Infectious disease  Neurosurgery Dr. Venetia Constable on July 2  CIR  Procedures:  Cervical six LAMINECTOMY AND  FUSION (  N/A) oon 7/2 by Dr. Venetia Constable  Midline placement  On July 14  Antimicrobials:   Cefazolin 7/5 > Rifampin 7/6 > Vancomycin 7/3 > 7/5    Objective: Vitals:   08/27/19 2358 08/28/19 0353 08/28/19 0758 08/28/19 1138  BP: 106/72 107/64 100/63 112/68  Pulse: 97 91 98 93  Resp: 17 18 18 16   Temp: 98.3 F (36.8 C) 98.2 F (36.8 C) 99.2 F (37.3 C) 98.7 F (37.1 C)  TempSrc: Oral Oral Oral Oral  SpO2: 100% 97% 100% 100%  Weight:      Height:       No intake or  output data in the 24 hours ending 08/28/19 1409 Filed Weights   08/07/19 2205 08/08/19 1253 08/08/19 1823  Weight: 59 kg 59 kg 61.2 kg    Examination:  General exam: thin, calm, NAD Respiratory system: Clear to auscultation. Respiratory effort normal. Cardiovascular system: S1 & S2 heard, RRR. No JVD, no murmur, No pedal edema. Gastrointestinal system: Abdomen is nondistended, soft and nontender. No organomegaly or masses felt. Normal bowel sounds heard. Central nervous system: Alert and oriented. No focal neurological deficits. Extremities: Symmetric 5 x 5 power. Skin: No rashes, lesions or ulcers Psychiatry: Flat affect, calm and cooperative    Data Reviewed: I have personally reviewed following labs and imaging studies  CBC: Recent Labs  Lab 08/27/19 0417  WBC 4.4  HGB 10.6*  HCT 32.8*  MCV 93.4  PLT 605*    Basic Metabolic Panel: Recent Labs  Lab 08/27/19 0417  NA 138  K 4.5  CL 100  CO2 31  GLUCOSE 111*  BUN 6  CREATININE 0.56  CALCIUM 9.3    GFR: Estimated Creatinine Clearance: 88.5 mL/min (by C-G formula based on SCr of 0.56 mg/dL).  Liver Function Tests: Recent Labs  Lab 08/27/19 0417  AST 226*  ALT 161*  ALKPHOS 157*  BILITOT 0.5  PROT 6.2*  ALBUMIN 2.7*    CBG: No results for input(s): GLUCAP in the last 168 hours.   No results found for this or any previous visit (from the past 240 hour(s)).       Radiology Studies: No results found.      Scheduled Meds: . Chlorhexidine Gluconate Cloth  6 each Topical Daily  . enoxaparin (LOVENOX) injection  40 mg Subcutaneous Q24H  . feeding supplement  1 Container Oral TID BM  . folic acid  1 mg Oral Daily  . morphine  45 mg Oral Q12H  . multivitamin with minerals  1 tablet Oral Daily  . nicotine  14 mg Transdermal Daily  . rifampin  300 mg Oral BID WC  . senna-docusate  1 tablet Oral BID  . sodium chloride flush  10-40 mL Intracatheter Q12H  . thiamine  100 mg Oral Daily    Continuous Infusions: . sodium chloride 10 mL/hr at 08/24/19 2340  .  ceFAZolin (ANCEF) IV 2 g (08/28/19 1342)     LOS: 20 days     Time spent: 24mins I have personally reviewed and interpreted on  08/28/2019 daily labs, tele strips, imagings as discussed above under date review session and assessment and plans.  I reviewed all nursing notes, pharmacy notes, consultant notes,  vitals, pertinent old records  I have discussed plan of care as described above with RN , patient  on 08/28/2019  Voice Recognition /Dragon dictation system was used to create this note, attempts have been made to correct errors. Please contact the author with questions and/or clarifications.  Florencia Reasons, MD PhD FACP [facility]  Available via Epic secure chat 7am-7pm for nonurgent issues Please page for urgent issues To page the attending provider between 7A-7P or the covering provider during after hours 7P-7A, please log into the web site [email or web address] and access using universal [place] password for that web site. If you do not have the password, please call the hospital operator.    08/28/2019, 2:09 PM

## 2019-08-29 LAB — CBC WITH DIFFERENTIAL/PLATELET
Abs Immature Granulocytes: 0.02 10*3/uL (ref 0.00–0.07)
Basophils Absolute: 0 10*3/uL (ref 0.0–0.1)
Basophils Relative: 1 %
Eosinophils Absolute: 0.2 10*3/uL (ref 0.0–0.5)
Eosinophils Relative: 3 %
HCT: 34.8 % — ABNORMAL LOW (ref 36.0–46.0)
Hemoglobin: 11 g/dL — ABNORMAL LOW (ref 12.0–15.0)
Immature Granulocytes: 0 %
Lymphocytes Relative: 27 %
Lymphs Abs: 1.3 10*3/uL (ref 0.7–4.0)
MCH: 29.3 pg (ref 26.0–34.0)
MCHC: 31.6 g/dL (ref 30.0–36.0)
MCV: 92.6 fL (ref 80.0–100.0)
Monocytes Absolute: 0.6 10*3/uL (ref 0.1–1.0)
Monocytes Relative: 13 %
Neutro Abs: 2.7 10*3/uL (ref 1.7–7.7)
Neutrophils Relative %: 56 %
Platelets: 466 10*3/uL — ABNORMAL HIGH (ref 150–400)
RBC: 3.76 MIL/uL — ABNORMAL LOW (ref 3.87–5.11)
RDW: 12.5 % (ref 11.5–15.5)
WBC: 4.9 10*3/uL (ref 4.0–10.5)
nRBC: 0 % (ref 0.0–0.2)

## 2019-08-29 LAB — COMPREHENSIVE METABOLIC PANEL
ALT: 214 U/L — ABNORMAL HIGH (ref 0–44)
AST: 315 U/L — ABNORMAL HIGH (ref 15–41)
Albumin: 2.9 g/dL — ABNORMAL LOW (ref 3.5–5.0)
Alkaline Phosphatase: 192 U/L — ABNORMAL HIGH (ref 38–126)
Anion gap: 9 (ref 5–15)
BUN: <5 mg/dL — ABNORMAL LOW (ref 6–20)
CO2: 28 mmol/L (ref 22–32)
Calcium: 9.5 mg/dL (ref 8.9–10.3)
Chloride: 101 mmol/L (ref 98–111)
Creatinine, Ser: 0.45 mg/dL (ref 0.44–1.00)
GFR calc Af Amer: >60 mL/min (ref >60)
GFR calc non Af Amer: >60 mL/min (ref >60)
Glucose, Bld: 109 mg/dL — ABNORMAL HIGH (ref 70–99)
Potassium: 3.9 mmol/L (ref 3.5–5.1)
Sodium: 138 mmol/L (ref 135–145)
Total Bilirubin: 0.5 mg/dL (ref 0.3–1.2)
Total Protein: 7 g/dL (ref 6.5–8.1)

## 2019-08-29 LAB — PROTIME-INR
INR: 1 (ref 0.8–1.2)
Prothrombin Time: 13.1 seconds (ref 11.4–15.2)

## 2019-08-29 NOTE — Progress Notes (Addendum)
PROGRESS NOTE    Monica Eaton  LGX:[id number] DOB: Oct 05, 1975 DOA: 08/07/2019 PCP: Patient, No Pcp Per    Chief Complaint  Patient presents with  . Back Pain    Brief Narrative:   44 year old with a history of IV drug abuse and tobacco abuse who presented with severe neck pain and upper back pain. In the ED she was found to have a CRP of 9.4 with a leukocytosis. 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  MRI was initially not able to be accomplished due to the patient's lack of cooperation.  She was transferred to [facility] and underwent MRI with general anesthesia.  Neurosurgery was consulted and the patient required surgical intervention 08/08/2019.  She was ultimately found to have a MSSA bacteremia for which ID was consulted.  At this time the plan is for her to complete 4 weeks of IV antibiotics as an inpatient.  Subjective:   Reports neck and  back pain . no significant change from before,   She ambulates with a walker due to feet numbness  No fever, T-max 99.1, wbc normalized on 7/22  Am lab was not done   Assessment & Plan:   Principal Problem:   Discitis of cervical region Active Problems:   Cervical pain (neck)   IVDU (intravenous drug user)   Tobacco abuse   Neck pain   Bandemia   MSSA bacteremia   Epidural abscess   Hardware complicating wound infection ([place])   Malnutrition of moderate degree   Acute MSSA cervical discitis/osteomyelitis - MSSA bacteremia POA  - s/p C3-7 lami C3-6 PSIF -Confirmed with MRI of cervical spine under general anesthesia -status post surgical intervention per Neurosurgery 7/2 by Dr. Zada Finders- (paitent requested me to call Dr. Colleen Can office for post op follow up questions.  he is on vacation this week, will be back on 7/28) -She was on rifampin and Ancef, rifampin discontinued on July 23 due to LFT elevation  antibiotics being directed by  Infectious Disease service w/ desire to complete 4-5 weeks of inpatient IV abx before considering discharge options   Elevation of LFT: liver ultrasound unremarkable Discussed with infectious disease Dr. Drucilla Schmidt for recommend stop rifampin Repeat LFT  Back pain and neck pain: She is on MS Contin 40 mg twice a day, she required as needed Dilaudid 5 times since midnight, Flexeril twice since midnight Continue stool softener  Thrombocytosis, likely reactive Appear has peaked at 780, now trending down  Normocytic anemia from acute illness -Hemoglobin around 10-11  IV drug abuse - polysubstance abuse Urine drug screen positive for benzodiazepines, opiates, and THC - patient is also a smoker  Moderate malnutrition  She declined boost supplement Nutrition Status: Nutrition Problem: Moderate Malnutrition Etiology: social / environmental circumstances Signs/Symptoms: mild fat depletion, mild muscle depletion, moderate muscle depletion, energy intake < 75% for > or equal to 3 months Interventions: MVI, Boost Breeze, Prostat   Cigarette smoking Smoking cessation education provided, continue nicotine patch  History of alcohol use, reports quit prior to coming to the hospital  DVT prophylaxis: enoxaparin (LOVENOX) injection 40 mg Start: 08/08/19 1800 SCDs Start: 08/08/19 0616   Code Status: Full Family Communication: Patient Disposition:   Status is: Inpatient   Dispo: The patient is from: Home              Anticipated d/c is to: Home  Anticipated d/c date is: Need to finish IV antibiotic while in the hospital                Consultants:   Infectious disease  Neurosurgery Dr. Zada Finders on July 2  CIR  Procedures:  Cervical six LAMINECTOMY AND  FUSION (N/A) oon 7/2 by Dr. Zada Finders  Midline placement  On July 14  Antimicrobials:   Cefazolin 7/5 > Rifampin 7/6 >7/22 Vancomycin 7/3 > 7/5    Objective: Vitals:   08/28/19 2028 08/28/19 2338 08/29/19  0430 08/29/19 0815  BP: 109/68 112/76 115/70 117/72  Pulse: 100 101 96 87  Resp: 18 18 16 18   Temp: 99.1 F (37.3 C) 98.8 F (37.1 C) 98.5 F (36.9 C) 98.2 F (36.8 C)  TempSrc: Oral Oral Oral Oral  SpO2: 99% 100% 100% 100%  Weight:      Height:        Intake/Output Summary (Last 24 hours) at 08/29/2019 1047 Last data filed at 08/29/2019 0754 Gross per 24 hour  Intake 1420 ml  Output --  Net 1420 ml   Filed Weights   08/07/19 2205 08/08/19 1253 08/08/19 1823  Weight: 59 kg 59 kg 61.2 kg    Examination:  General exam: thin, calm, NAD, dressing intact posterior neck, surgical wound C/D/I Respiratory system: Clear to auscultation. Respiratory effort normal. Cardiovascular system: S1 & S2 heard, RRR. No JVD, no murmur, No pedal edema. Gastrointestinal system: Abdomen is nondistended, soft and nontender. No organomegaly or masses felt. Normal bowel sounds heard. Central nervous system: Alert and oriented. No focal neurological deficits. Extremities: Symmetric 5 x 5 power. Skin: No rashes, lesions or ulcers Psychiatry: Flat affect, calm and cooperative    Data Reviewed: I have personally reviewed following labs and imaging studies  CBC: Recent Labs  Lab 08/27/19 0417  WBC 4.4  HGB 10.6*  HCT 32.8*  MCV 93.4  PLT 605*    Basic Metabolic Panel: Recent Labs  Lab 08/27/19 0417  NA 138  K 4.5  CL 100  CO2 31  GLUCOSE 111*  BUN 6  CREATININE 0.56  CALCIUM 9.3    GFR: Estimated Creatinine Clearance: 88.5 mL/min (by C-G formula based on SCr of 0.56 mg/dL).  Liver Function Tests: Recent Labs  Lab 08/27/19 0417  AST 226*  ALT 161*  ALKPHOS 157*  BILITOT 0.5  PROT 6.2*  ALBUMIN 2.7*    CBG: No results for input(s): GLUCAP in the last 168 hours.   No results found for this or any previous visit (from the past 240 hour(s)).       Radiology Studies: US Abdomen Limited RUQ  Result Date: 08/28/2019 CLINICAL DATA:  Elevated liver function tests  EXAM: ULTRASOUND ABDOMEN LIMITED RIGHT UPPER QUADRANT COMPARISON:  None. FINDINGS: Gallbladder: No gallstones or wall thickening visualized. No sonographic Murphy sign noted by sonographer. Common bile duct: Diameter: 6 mm Liver: No focal lesion identified. Within normal limits in parenchymal echogenicity. Portal vein is patent on color Doppler imaging with normal direction of blood flow towards the liver. Other: None. IMPRESSION: 1. Unremarkable right upper quadrant ultrasound. Electronically Signed   By: Randa Ngo M.D.   On: 08/28/2019 19:54        Scheduled Meds: . Chlorhexidine Gluconate Cloth  6 each Topical Daily  . enoxaparin (LOVENOX) injection  40 mg Subcutaneous Q24H  . feeding supplement  1 Container Oral TID BM  . folic acid  1 mg Oral Daily  . morphine  45 mg Oral Q12H  .  multivitamin with minerals  1 tablet Oral Daily  . nicotine  14 mg Transdermal Daily  . senna-docusate  1 tablet Oral BID  . sodium chloride flush  10-40 mL Intracatheter Q12H  . thiamine  100 mg Oral Daily   Continuous Infusions: . sodium chloride 10 mL/hr at 08/24/19 2340  .  ceFAZolin (ANCEF) IV 2 g (08/29/19 0601)     LOS: 21 days     Time spent: 59mins I have personally reviewed and interpreted on  08/29/2019 daily labs, imagings as discussed above under date review session and assessment and plans.  I reviewed all nursing notes, pharmacy notes, consultant notes,  vitals, pertinent old records  I have discussed plan of care as described above with RN , patient  on 08/29/2019  Voice Recognition /Dragon dictation system was used to create this note, attempts have been made to correct errors. Please contact the author with questions and/or clarifications.   Florencia Reasons, MD PhD FACP [facility]  Available via Epic secure chat 7am-7pm for nonurgent issues Please page for urgent issues To page the attending provider between 7A-7P or the covering provider during after hours 7P-7A, please  log into the web site [email or web address] and access using universal [place] password for that web site. If you do not have the password, please call the hospital operator.    08/29/2019, 10:47 AM

## 2019-08-29 NOTE — Progress Notes (Signed)
Physical Therapy Treatment Patient Details Name: Monica Eaton MRN: [id number] DOB: 06/19/1975 Today's Date: 08/29/2019    History of Present Illness Monica Eaton is a 44 y.o. female with medical history significant for tobacco abuse and IV drug abuse who presents to the emergency department due to sudden onset of upper back pain which started about 3 -4 hours PTA.  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 Pt underwent, s/p C3-6 OLaminectomy & fusion. Pt is remaining hospitalized for IV antibiotics.    PT Comments    Pt in bed upon arrival of PT, eager to participate in OOB mobility at this time. The pt continues to demo limitations in functional activity tolerance, strength, and power due to significant pain in her neck and UE. She was limited to short ambulation in room today, but was able to do so with single UE support for balance. The pt will continue to benefit from skilled PT to further progress functional stability, endurance, and possible progress to less restrictive AD.     Follow Up Recommendations  Home health PT;Supervision/Assistance - 24 hour     Equipment Recommendations  Rolling walker with 5" wheels    Recommendations for Other Services       Precautions / Restrictions Precautions Precautions: Cervical;Fall Precaution Booklet Issued: Yes (comment) Precaution Comments: reviewed precautions during mobility Required Braces or Orthoses: Cervical Brace Cervical Brace: Soft collar;For comfort Restrictions Weight Bearing Restrictions: No Other Position/Activity Restrictions: pt not wearing sofl collar today    Mobility  Bed Mobility Overal bed mobility: Modified Independent Bed Mobility: Rolling;Sidelying to Sit;Sit to Sidelying Rolling: Supervision Sidelying to sit: Supervision       General bed mobility comments: no physical assist needed, + use of rail and HOB up  Transfers Overall transfer  level: Needs assistance Equipment used: None;1 person hand held assist Transfers: Sit to/from Stand Sit to Stand: Supervision         General transfer comment: pt able to stand without assist to power up or need for AD, pt with some reaching for single UE support to stabilize once in standing position  Ambulation/Gait Ambulation/Gait assistance: Min assist Gait Distance (Feet): 20 Feet Assistive device: 1 person hand held assist Gait Pattern/deviations: Step-through pattern;Decreased stride length;Decreased dorsiflexion - right;Decreased dorsiflexion - left Gait velocity: decreased Gait velocity interpretation: <1.8 ft/sec, indicate of risk for recurrent falls General Gait Details: pt with urgent need to use the bathroom, able to ambulate in room without AD but HHA for improved stability. no LOB during short ambulation in room       Balance Overall balance assessment: Needs assistance Sitting-balance support: No upper extremity supported Sitting balance-Leahy Scale: Good Sitting balance - Comments: able to sit EOB without UE, supervision only   Standing balance support: During functional activity;Single extremity supported Standing balance-Leahy Scale: Fair Standing balance comment: pt able to ambulate with minA through HHA of 1, prefers BUE support during mobility                            Cognition Arousal/Alertness: Awake/alert Behavior During Therapy: WFL for tasks assessed/performed Overall Cognitive Status: Within Functional Limits for tasks assessed                                 General Comments: pt continues to report significant pain but generally WFL for participating in session  with good awareness of safety and functional use of precautions with mobility      Exercises      General Comments General comments (skin integrity, edema, etc.): VSS on RA, pt reports limited due to sig pain in neck and uppper arms      Pertinent  Vitals/Pain Pain Assessment: Faces Faces Pain Scale: Hurts even more Pain Location: neck, arms/hands with bed mobility Pain Descriptors / Indicators: Grimacing;Pins and needles;Stabbing Pain Intervention(s): Limited activity within patient's tolerance;Patient requesting pain meds-RN notified;Repositioned           PT Goals (current goals can now be found in the care plan section) Acute Rehab PT Goals Patient Stated Goal: decrease pain PT Goal Formulation: With patient Time For Goal Achievement: 09/11/19 Potential to Achieve Goals: Good Progress towards PT goals: Progressing toward goals    Frequency    Min 3X/week      PT Plan Current plan remains appropriate       AM-PAC PT "6 Clicks" Mobility   Outcome Measure  Help needed turning from your back to your side while in a flat bed without using bedrails?: None Help needed moving from lying on your back to sitting on the side of a flat bed without using bedrails?: None Help needed moving to and from a bed to a chair (including a wheelchair)?: None Help needed standing up from a chair using your arms (e.g., wheelchair or bedside chair)?: None Help needed to walk in hospital room?: A Little Help needed climbing 3-5 steps with a railing? : A Little 6 Click Score: 22    End of Session Equipment Utilized During Treatment: Gait belt Activity Tolerance: Patient tolerated treatment well Patient left: with call bell/phone within reach;with chair alarm set;in chair Nurse Communication: Mobility status PT Visit Diagnosis: Unsteadiness on feet (R26.81);Difficulty in walking, not elsewhere classified (R26.2)     Time: 1610-9604 PT Time Calculation (min) (ACUTE ONLY): 17 min  Charges:  $Gait Training: 8-22 mins                     Karma Ganja, PT, DPT   Acute Rehabilitation Department Pager #: [phone]   Otho Bellows 08/29/2019, 3:25 PM

## 2019-08-30 LAB — COMPREHENSIVE METABOLIC PANEL
ALT: 195 U/L — ABNORMAL HIGH (ref 0–44)
AST: 265 U/L — ABNORMAL HIGH (ref 15–41)
Albumin: 2.7 g/dL — ABNORMAL LOW (ref 3.5–5.0)
Alkaline Phosphatase: 191 U/L — ABNORMAL HIGH (ref 38–126)
Anion gap: 9 (ref 5–15)
BUN: <5 mg/dL — ABNORMAL LOW (ref 6–20)
CO2: 29 mmol/L (ref 22–32)
Calcium: 9.2 mg/dL (ref 8.9–10.3)
Chloride: 101 mmol/L (ref 98–111)
Creatinine, Ser: 0.45 mg/dL (ref 0.44–1.00)
GFR calc Af Amer: >60 mL/min (ref >60)
GFR calc non Af Amer: >60 mL/min (ref >60)
Glucose, Bld: 136 mg/dL — ABNORMAL HIGH (ref 70–99)
Potassium: 3.9 mmol/L (ref 3.5–5.1)
Sodium: 139 mmol/L (ref 135–145)
Total Bilirubin: 0.4 mg/dL (ref 0.3–1.2)
Total Protein: 6.4 g/dL — ABNORMAL LOW (ref 6.5–8.1)

## 2019-08-30 LAB — CBC WITH DIFFERENTIAL/PLATELET
Abs Immature Granulocytes: 0 10*3/uL (ref 0.00–0.07)
Basophils Absolute: 0 10*3/uL (ref 0.0–0.1)
Basophils Relative: 1 %
Eosinophils Absolute: 0.2 10*3/uL (ref 0.0–0.5)
Eosinophils Relative: 5 %
HCT: 32.9 % — ABNORMAL LOW (ref 36.0–46.0)
Hemoglobin: 10.4 g/dL — ABNORMAL LOW (ref 12.0–15.0)
Immature Granulocytes: 0 %
Lymphocytes Relative: 35 %
Lymphs Abs: 1.5 10*3/uL (ref 0.7–4.0)
MCH: 29.2 pg (ref 26.0–34.0)
MCHC: 31.6 g/dL (ref 30.0–36.0)
MCV: 92.4 fL (ref 80.0–100.0)
Monocytes Absolute: 0.6 10*3/uL (ref 0.1–1.0)
Monocytes Relative: 14 %
Neutro Abs: 1.9 10*3/uL (ref 1.7–7.7)
Neutrophils Relative %: 45 %
Platelets: 418 10*3/uL — ABNORMAL HIGH (ref 150–400)
RBC: 3.56 MIL/uL — ABNORMAL LOW (ref 3.87–5.11)
RDW: 12.6 % (ref 11.5–15.5)
WBC: 4.2 10*3/uL (ref 4.0–10.5)
nRBC: 0 % (ref 0.0–0.2)

## 2019-08-30 LAB — TSH: TSH: 4.094 u[IU]/mL (ref 0.350–4.500)

## 2019-08-30 LAB — MAGNESIUM: Magnesium: 1.6 mg/dL — ABNORMAL LOW (ref 1.7–2.4)

## 2019-08-30 MED ORDER — MAGNESIUM SULFATE 2 GM/50ML IV SOLN
2.0000 g | Freq: Once | INTRAVENOUS | Status: AC
  Administered 2019-08-30: 2 g via INTRAVENOUS
  Filled 2019-08-30: qty 50

## 2019-08-30 NOTE — Progress Notes (Addendum)
PROGRESS NOTE    Monica Eaton  BJY:[id number] DOB: [birth year] DOA: 08/07/2019 PCP: Patient, No Pcp Per    Chief Complaint  Patient presents with  . Back Pain    Brief Narrative:   44 year old with a history of IV drug abuse and tobacco abuse who presented with severe neck pain and upper back pain. In the ED she was found to have a CRP of 9.4 with a leukocytosis. 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  MRI was initially not able to be accomplished due to the patient's lack of cooperation.  She was transferred to [facility] and underwent MRI with general anesthesia.  Neurosurgery was consulted and the patient required surgical intervention 08/08/2019.  She was ultimately found to have a MSSA bacteremia for which ID was consulted.  At this time the plan is for her to complete 4 weeks of IV antibiotics as an inpatient.  Subjective:   Reports neck and  back pain . no significant change from before,   She ambulates with a walker due to feet numbness  No fever, T-max 98.5, wbc normalized on 7/22  Her friend visited her , brought her flowers and lunch.   Assessment & Plan:   Principal Problem:   Discitis of cervical region Active Problems:   Cervical pain (neck)   IVDU (intravenous drug user)   Tobacco abuse   Neck pain   Bandemia   MSSA bacteremia   Epidural abscess   Hardware complicating wound infection ([place])   Malnutrition of moderate degree   Acute MSSA cervical discitis/osteomyelitis - MSSA bacteremia POA  - s/p C3-7 lami C3-6 PSIF -Confirmed with MRI of cervical spine under general anesthesia -status post surgical intervention per Neurosurgery 7/2 by Dr. Zada Finders- (paitent requested me to call Dr. Colleen Can office for post op follow up questions.  he is on vacation this week, will be back on 7/28) -She was on rifampin and Ancef, rifampin discontinued on July 23 due to LFT  elevation  antibiotics being directed by Infectious Disease service w/ desire to complete 4-5 weeks of inpatient IV abx before considering discharge options   Elevation of LFT: liver ultrasound unremarkable Discussed with infectious disease Dr. Drucilla Schmidt for recommend stop rifampin  LFT appear to trend down after stopping rifampin, monitor LFT intermittently  Back pain and neck pain: She is on MS Contin 40 mg twice a day, she required as needed Dilaudid 5 times since midnight, Flexeril twice since midnight Continue stool softener  Thrombocytosis, likely reactive Appear has peaked at 780, now trending down  Normocytic anemia from acute illness -Hemoglobin around 10-11  Hypomagnesemia, give IV mag 2 g today  Moderate malnutrition  She declined boost supplement Nutrition Status: Nutrition Problem: Moderate Malnutrition Etiology: social / environmental circumstances Signs/Symptoms: mild fat depletion, mild muscle depletion, moderate muscle depletion, energy intake < 75% for > or equal to 3 months Interventions: MVI, Boost Breeze, Prostat  IV drug abuse - polysubstance abuse Urine drug screen positive for benzodiazepines, opiates, and THC - patient is also a smoker  Cigarette smoking Smoking cessation education provided, continue nicotine patch  History of alcohol use, reports quit prior to coming to the hospital  DVT prophylaxis: enoxaparin (LOVENOX) injection 40 mg Start: 08/08/19 1800 SCDs Start: 08/08/19 0616   Code Status: Full Family Communication: Patient Disposition:   Status is: Inpatient   Dispo: The patient is from: Home  Anticipated d/c is to: Home              Anticipated d/c date is: Need to finish IV antibiotic while in the hospital                Consultants:   Infectious disease  Neurosurgery Dr. Zada Finders on July 2  CIR  Procedures:  Cervical six LAMINECTOMY AND  FUSION (N/A) oon 7/2 by Dr. Zada Finders  Midline placement  On July  14  Antimicrobials:   Cefazolin 7/5 > Rifampin 7/6 >7/22 Vancomycin 7/3 > 7/5    Objective: Vitals:   08/29/19 2330 08/30/19 0331 08/30/19 0332 08/30/19 0349  BP:  (!) 92/61 (!) 92/61 (!) 98/64  Pulse: 96 91 91 98  Resp:   18   Temp:  98.5 F (36.9 C) 98.5 F (36.9 C)   TempSrc:  Oral Oral   SpO2:  98% 98%   Weight:      Height:       No intake or output data in the 24 hours ending 08/30/19 0817 Filed Weights   08/07/19 2205 08/08/19 1253 08/08/19 1823  Weight: 59 kg 59 kg 61.2 kg    Examination:  General exam: thin, calm, NAD, dressing intact posterior neck, surgical wound C/D/I Respiratory system: Clear to auscultation. Respiratory effort normal. Cardiovascular system: S1 & S2 heard, RRR. No JVD, no murmur, No pedal edema. Gastrointestinal system: Abdomen is nondistended, soft and nontender. No organomegaly or masses felt. Normal bowel sounds heard. Central nervous system: Alert and oriented. No focal neurological deficits. Extremities: Symmetric 5 x 5 power. Skin: No rashes, lesions or ulcers Psychiatry: Flat affect, calm and cooperative    Data Reviewed: I have personally reviewed following labs and imaging studies  CBC: Recent Labs  Lab 08/27/19 0417 08/29/19 1233 08/30/19 0220  WBC 4.4 4.9 4.2  NEUTROABS  --  2.7 1.9  HGB 10.6* 11.0* 10.4*  HCT 32.8* 34.8* 32.9*  MCV 93.4 92.6 92.4  PLT 605* 466* 418*    Basic Metabolic Panel: Recent Labs  Lab 08/27/19 0417 08/29/19 1233 08/30/19 0220  NA 138 138 139  K 4.5 3.9 3.9  CL 100 101 101  CO2 31 28 29   GLUCOSE 111* 109* 136*  BUN 6 <5* <5*  CREATININE 0.56 0.45 0.45  CALCIUM 9.3 9.5 9.2  MG  --   --  1.6*    GFR: Estimated Creatinine Clearance: 88.5 mL/min (by C-G formula based on SCr of 0.45 mg/dL).  Liver Function Tests: Recent Labs  Lab 08/27/19 0417 08/29/19 1233 08/30/19 0220  AST 226* 315* 265*  ALT 161* 214* 195*  ALKPHOS 157* 192* 191*  BILITOT 0.5 0.5 0.4  PROT 6.2* 7.0  6.4*  ALBUMIN 2.7* 2.9* 2.7*    CBG: No results for input(s): GLUCAP in the last 168 hours.   No results found for this or any previous visit (from the past 240 hour(s)).       Radiology Studies: US Abdomen Limited RUQ  Result Date: 08/28/2019 CLINICAL DATA:  Elevated liver function tests EXAM: ULTRASOUND ABDOMEN LIMITED RIGHT UPPER QUADRANT COMPARISON:  None. FINDINGS: Gallbladder: No gallstones or wall thickening visualized. No sonographic Murphy sign noted by sonographer. Common bile duct: Diameter: 6 mm Liver: No focal lesion identified. Within normal limits in parenchymal echogenicity. Portal vein is patent on color Doppler imaging with normal direction of blood flow towards the liver. Other: None. IMPRESSION: 1. Unremarkable right upper quadrant ultrasound. Electronically Signed   By: Randa Ngo  M.D.   On: 08/28/2019 19:54        Scheduled Meds: . Chlorhexidine Gluconate Cloth  6 each Topical Daily  . enoxaparin (LOVENOX) injection  40 mg Subcutaneous Q24H  . feeding supplement  1 Container Oral TID BM  . folic acid  1 mg Oral Daily  . morphine  45 mg Oral Q12H  . multivitamin with minerals  1 tablet Oral Daily  . nicotine  14 mg Transdermal Daily  . senna-docusate  1 tablet Oral BID  . sodium chloride flush  10-40 mL Intracatheter Q12H  . thiamine  100 mg Oral Daily   Continuous Infusions: . sodium chloride 10 mL/hr at 08/24/19 2340  .  ceFAZolin (ANCEF) IV 2 g (08/30/19 0617)  . magnesium sulfate bolus IVPB       LOS: 22 days     Time spent: 14mins I have personally reviewed and interpreted on  08/30/2019 daily labs, imagings as discussed above under date review session and assessment and plans.  I reviewed all nursing notes, pharmacy notes, consultant notes,  vitals, pertinent old records  I have discussed plan of care as described above with RN , patient  on 08/30/2019  Voice Recognition /Dragon dictation system was used to create this note, attempts  have been made to correct errors. Please contact the author with questions and/or clarifications.   Florencia Reasons, MD PhD FACP [facility]  Available via Epic secure chat 7am-7pm for nonurgent issues Please page for urgent issues To page the attending provider between 7A-7P or the covering provider during after hours 7P-7A, please log into the web site [email or web address] and access using universal [place] password for that web site. If you do not have the password, please call the hospital operator.    08/30/2019, 8:17 AM

## 2019-08-31 NOTE — Progress Notes (Signed)
PROGRESS NOTE    Monica Eaton  VEL:[id number] DOB: 28-Oct-1975 DOA: 08/07/2019 PCP: Patient, No Pcp Per    Chief Complaint  Patient presents with   Back Pain    Brief Narrative:   44 year old with a history of IV drug abuse and tobacco abuse who presented with severe neck pain and upper back pain. In the ED she was found to have a CRP of 9.4 with a leukocytosis. 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  MRI was initially not able to be accomplished due to the patient's lack of cooperation.  She was transferred to [facility] and underwent MRI with general anesthesia.  Neurosurgery was consulted and the patient required surgical intervention 08/08/2019.  She was ultimately found to have a MSSA bacteremia for which ID was consulted.  At this time the plan is for her to complete 4 weeks of IV antibiotics as an inpatient.  Subjective:   Reports neck and  back pain . no significant change from before,   She ambulates with a walker due to feet numbness  No fever, T-max 98.5, wbc normalized on 7/22   Assessment & Plan:   Principal Problem:   Discitis of cervical region Active Problems:   Cervical pain (neck)   IVDU (intravenous drug user)   Tobacco abuse   Neck pain   Bandemia   MSSA bacteremia   Epidural abscess   Hardware complicating wound infection ([place])   Malnutrition of moderate degree   Acute MSSA cervical discitis/osteomyelitis - MSSA bacteremia POA  - s/p C3-7 lami C3-6 PSIF -Confirmed with MRI of cervical spine under general anesthesia -status post surgical intervention per Neurosurgery 7/2 by Dr. Zada Finders- (paitent requested me to call Dr. Colleen Can office for post op follow up questions.  he is on vacation this week, will be back on 7/28) -She was on rifampin and Ancef, rifampin discontinued on July 23 due to LFT elevation  antibiotics being directed by Infectious Disease  service w/ desire to complete 4-5 weeks of inpatient IV abx before considering discharge options   Elevation of LFT: liver ultrasound unremarkable Discussed with infectious disease Dr. Drucilla Schmidt for recommend stop rifampin  LFT appear to trend down after stopping rifampin, monitor LFT intermittently  Back pain and neck pain: She is on MS Contin 40 mg twice a day, she required as needed Dilaudid 5 times since midnight, Flexeril twice since midnight Continue stool softener  Thrombocytosis, likely reactive Appear has peaked at 780, now trending down  Normocytic anemia from acute illness -Hemoglobin around 10-11  Hypomagnesemia, give IV mag 2 g today  Moderate malnutrition  She declined boost supplement Nutrition Status: Nutrition Problem: Moderate Malnutrition Etiology: social / environmental circumstances Signs/Symptoms: mild fat depletion, mild muscle depletion, moderate muscle depletion, energy intake < 75% for > or equal to 3 months Interventions: MVI, Boost Breeze, Prostat  IV drug abuse - polysubstance abuse Urine drug screen positive for benzodiazepines, opiates, and THC - patient is also a smoker  Cigarette smoking Smoking cessation education provided, continue nicotine patch  History of alcohol use, reports quit prior to coming to the hospital  DVT prophylaxis: enoxaparin (LOVENOX) injection 40 mg Start: 08/08/19 1800 SCDs Start: 08/08/19 0616   Code Status: Full Family Communication: Patient Disposition:   Status is: Inpatient   Dispo: The patient is from: Home              Anticipated d/c is to: Home  Anticipated d/c date is: Need to finish IV antibiotic while in the hospital                Consultants:   Infectious disease  Neurosurgery Dr. Zada Finders on July 2  CIR  Procedures:  Cervical six LAMINECTOMY AND  FUSION (N/A) oon 7/2 by Dr. Zada Finders  Midline placement  On July 14  Antimicrobials:   Cefazolin 7/5 > Rifampin 7/6  >7/22 Vancomycin 7/3 > 7/5    Objective: Vitals:   08/30/19 2005 08/30/19 2336 08/31/19 0358 08/31/19 0823  BP: 118/77 (!) 106/63 110/71 109/69  Pulse: 104 99 96 91  Resp: 18 18 18 18   Temp: 98.7 F (37.1 C) 98.4 F (36.9 C) 98.4 F (36.9 C) 98.6 F (37 C)  TempSrc: Oral Oral Oral Oral  SpO2: 99% 99% 100% 100%  Weight:      Height:        Intake/Output Summary (Last 24 hours) at 08/31/2019 1014 Last data filed at 08/30/2019 1100 Gross per 24 hour  Intake 361.51 ml  Output --  Net 361.51 ml   Filed Weights   08/07/19 2205 08/08/19 1253 08/08/19 1823  Weight: 59 kg 59 kg 61.2 kg    Examination:  General exam: thin, calm, NAD, dressing intact posterior neck, surgical wound C/D/I Respiratory system: Clear to auscultation. Respiratory effort normal. Cardiovascular system: S1 & S2 heard, RRR. No JVD, no murmur, No pedal edema. Gastrointestinal system: Abdomen is nondistended, soft and nontender. No organomegaly or masses felt. Normal bowel sounds heard. Central nervous system: Alert and oriented. No focal neurological deficits. Extremities: Symmetric 5 x 5 power. Skin: No rashes, lesions or ulcers Psychiatry: Flat affect, calm and cooperative    Data Reviewed: I have personally reviewed following labs and imaging studies  CBC: Recent Labs  Lab 08/27/19 0417 08/29/19 1233 08/30/19 0220  WBC 4.4 4.9 4.2  NEUTROABS  --  2.7 1.9  HGB 10.6* 11.0* 10.4*  HCT 32.8* 34.8* 32.9*  MCV 93.4 92.6 92.4  PLT 605* 466* 418*    Basic Metabolic Panel: Recent Labs  Lab 08/27/19 0417 08/29/19 1233 08/30/19 0220  NA 138 138 139  K 4.5 3.9 3.9  CL 100 101 101  CO2 31 28 29   GLUCOSE 111* 109* 136*  BUN 6 <5* <5*  CREATININE 0.56 0.45 0.45  CALCIUM 9.3 9.5 9.2  MG  --   --  1.6*    GFR: Estimated Creatinine Clearance: 88.5 mL/min (by C-G formula based on SCr of 0.45 mg/dL).  Liver Function Tests: Recent Labs  Lab 08/27/19 0417 08/29/19 1233 08/30/19 0220  AST  226* 315* 265*  ALT 161* 214* 195*  ALKPHOS 157* 192* 191*  BILITOT 0.5 0.5 0.4  PROT 6.2* 7.0 6.4*  ALBUMIN 2.7* 2.9* 2.7*    CBG: No results for input(s): GLUCAP in the last 168 hours.   No results found for this or any previous visit (from the past 240 hour(s)).       Radiology Studies: No results found.      Scheduled Meds:  Chlorhexidine Gluconate Cloth  6 each Topical Daily   enoxaparin (LOVENOX) injection  40 mg Subcutaneous Q24H   feeding supplement  1 Container Oral TID BM   folic acid  1 mg Oral Daily   morphine  45 mg Oral Q12H   multivitamin with minerals  1 tablet Oral Daily   nicotine  14 mg Transdermal Daily   senna-docusate  1 tablet Oral BID   sodium  chloride flush  10-40 mL Intracatheter Q12H   thiamine  100 mg Oral Daily   Continuous Infusions:  sodium chloride 10 mL/hr at 08/24/19 2340    ceFAZolin (ANCEF) IV 2 g (08/31/19 0535)     LOS: 23 days     Time spent: 70mins I have personally reviewed and interpreted on  08/31/2019 daily labs, imagings as discussed above under date review session and assessment and plans.  I reviewed all nursing notes, pharmacy notes, consultant notes,  vitals, pertinent old records  I have discussed plan of care as described above with RN , patient  on 08/31/2019  Voice Recognition /Dragon dictation system was used to create this note, attempts have been made to correct errors. Please contact the author with questions and/or clarifications.   Florencia Reasons, MD PhD FACP [facility]  Available via Epic secure chat 7am-7pm for nonurgent issues Please page for urgent issues To page the attending provider between 7A-7P or the covering provider during after hours 7P-7A, please log into the web site [email or web address] and access using universal [place] password for that web site. If you do not have the password, please call the hospital operator.    08/31/2019, 10:14 AM

## 2019-09-01 LAB — CBC WITH DIFFERENTIAL/PLATELET
Abs Immature Granulocytes: 0.1 10*3/uL — ABNORMAL HIGH (ref 0.00–0.07)
Basophils Absolute: 0 10*3/uL (ref 0.0–0.1)
Basophils Relative: 0 %
Eosinophils Absolute: 0.4 10*3/uL (ref 0.0–0.5)
Eosinophils Relative: 8 %
HCT: 33 % — ABNORMAL LOW (ref 36.0–46.0)
Hemoglobin: 10.6 g/dL — ABNORMAL LOW (ref 12.0–15.0)
Lymphocytes Relative: 33 %
Lymphs Abs: 1.5 10*3/uL (ref 0.7–4.0)
MCH: 29.8 pg (ref 26.0–34.0)
MCHC: 32.1 g/dL (ref 30.0–36.0)
MCV: 92.7 fL (ref 80.0–100.0)
Metamyelocytes Relative: 2 %
Monocytes Absolute: 0.5 10*3/uL (ref 0.1–1.0)
Monocytes Relative: 11 %
Myelocytes: 1 %
Neutro Abs: 2 10*3/uL (ref 1.7–7.7)
Neutrophils Relative %: 45 %
Platelets: 377 10*3/uL (ref 150–400)
RBC: 3.56 MIL/uL — ABNORMAL LOW (ref 3.87–5.11)
RDW: 12.7 % (ref 11.5–15.5)
WBC: 4.5 10*3/uL (ref 4.0–10.5)
nRBC: 0 % (ref 0.0–0.2)

## 2019-09-01 LAB — COMPREHENSIVE METABOLIC PANEL
ALT: 236 U/L — ABNORMAL HIGH (ref 0–44)
AST: 341 U/L — ABNORMAL HIGH (ref 15–41)
Albumin: 2.7 g/dL — ABNORMAL LOW (ref 3.5–5.0)
Alkaline Phosphatase: 230 U/L — ABNORMAL HIGH (ref 38–126)
Anion gap: 10 (ref 5–15)
BUN: 6 mg/dL (ref 6–20)
CO2: 28 mmol/L (ref 22–32)
Calcium: 9.1 mg/dL (ref 8.9–10.3)
Chloride: 99 mmol/L (ref 98–111)
Creatinine, Ser: 0.51 mg/dL (ref 0.44–1.00)
GFR calc Af Amer: >60 mL/min (ref >60)
GFR calc non Af Amer: >60 mL/min (ref >60)
Glucose, Bld: 111 mg/dL — ABNORMAL HIGH (ref 70–99)
Potassium: 4.2 mmol/L (ref 3.5–5.1)
Sodium: 137 mmol/L (ref 135–145)
Total Bilirubin: 0.5 mg/dL (ref 0.3–1.2)
Total Protein: 6.3 g/dL — ABNORMAL LOW (ref 6.5–8.1)

## 2019-09-01 LAB — MAGNESIUM: Magnesium: 1.6 mg/dL — ABNORMAL LOW (ref 1.7–2.4)

## 2019-09-01 MED ORDER — HYDROMORPHONE HCL 1 MG/ML IJ SOLN
0.5000 mg | INTRAMUSCULAR | Status: DC | PRN
  Administered 2019-09-01 – 2019-09-05 (×24): 0.5 mg via INTRAVENOUS
  Filled 2019-09-01 (×25): qty 0.5

## 2019-09-01 MED ORDER — MAGNESIUM SULFATE 2 GM/50ML IV SOLN
2.0000 g | Freq: Once | INTRAVENOUS | Status: AC
  Administered 2019-09-01: 2 g via INTRAVENOUS
  Filled 2019-09-01: qty 50

## 2019-09-01 NOTE — Progress Notes (Signed)
PROGRESS NOTE    Monica Eaton  CNO:[id number] DOB: April 25, 1975 DOA: 08/07/2019 PCP: Patient, No Pcp Per   Brief Narrative:  Patient is a 44 year old female with history of IV drug abuse, tobacco abuse who presents with severe neck pain and upper back pain.  In the emergency department she was found to have leukocytosis, elevated CRP.  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  Neurosurgery was consulted and she has required surgical intervention on 08/08/2019.  She was found to have MSSA bacteremia and started on IV antibiotics.  Plan is to complete antibiotic course during this hospitalization.  Assessment & Plan:   Principal Problem:   Discitis of cervical region Active Problems:   Cervical pain (neck)   IVDU (intravenous drug user)   Tobacco abuse   Neck pain   Bandemia   MSSA bacteremia   Epidural abscess   Hardware complicating wound infection ([place])   Malnutrition of moderate degree   MSSA cervical discitis/osteomyelitis: Status post C3-7 laminectomy , C3-6 PSIF.  Neurosurgery was following.  She will follow-up with neurosurgery as an outpatient.  MSSA bacteremia: Currently on cefazolin.  Plan is to continue 4 to 5 weeks of IV cefazolin as an inpatient and switch to oral therapy.  Rifampin has been stopped due to elevated liver enzymes.  2D echo did not show any vegetations.  She needs long-term oral antibiotics given placement of hardware.  We will recall ID on August 1 for possible consideration of changing antibiotics to oral.  IV drug use: Counseled for cessation.  HIV, hepatitis B and hepatitis C negative.  UDS was positive for benzodiazepines, opiates, THC.  Patient is also a smoker.  Continue nicotine patch.  Elevated LFTs: Liver ultrasound unremarkable.  Suspected to be from rifampin.  Continue to monitor LFTs.  Hepatitis panel negative.  Neck pain/back pain: On MS Contin,  Flexeril.  Continue supportive care  Normocytic anemia: Currently hemoglobin stable.  Hypomagnesemia: Being supplemented  Nutrition Problem: Moderate Malnutrition Etiology: social / environmental circumstances      DVT prophylaxis: Lovenox Code Status: Full code Family Communication: None present at the bedside Status is: Inpatient  Remains inpatient appropriate because:IV treatments appropriate due to intensity of illness or inability to take PO   Dispo: The patient is from: Home              Anticipated d/c is to: Home              Anticipated d/c date is: > 3 days              Patient currently is not medically stable to d/c.      Consultants: Neuro surgery, ID  Procedures: Cervical laminectomy and fusion  Antimicrobials:  Anti-infectives (From admission, onward)   Start     Dose/Rate Route Frequency Ordered Stop   08/12/19 1700  rifampin (RIFADIN) capsule 300 mg  Status:  Discontinued        300 mg Oral 2 times daily with meals 08/12/19 1353 08/28/19 1415   08/11/19 1400  ceFAZolin (ANCEF) IVPB 2g/100 mL premix     Discontinue     2 g 200 mL/hr over 30 Minutes Intravenous Every 8 hours 08/11/19 1159     08/10/19 1400  vancomycin (VANCOREADY) IVPB 750 mg/150 mL  Status:  Discontinued        750 mg 150 mL/hr over 60 Minutes Intravenous Every 8 hours 08/10/19 1254 08/11/19  1159   08/08/19 2042  bacitracin 50,000 Units in sodium chloride 0.9 % 500 mL irrigation  Status:  Discontinued          As needed 08/08/19 2043 08/08/19 2350   08/08/19 1800  vancomycin (VANCOCIN) IVPB 1000 mg/200 mL premix  Status:  Discontinued        1,000 mg 200 mL/hr over 60 Minutes Intravenous Every 12 hours 08/08/19 0644 08/10/19 0919   08/08/19 0800  levofloxacin (LEVAQUIN) IVPB 750 mg  Status:  Discontinued        750 mg 100 mL/hr over 90 Minutes Intravenous Every 24 hours 08/08/19 0644 08/09/19 0115   08/08/19 0645  vancomycin (VANCOREADY) IVPB 1250 mg/250 mL        1,250 mg 166.7  mL/hr over 90 Minutes Intravenous  Once 08/08/19 5035 08/08/19 0916      Subjective: Patient seen and examined at the bedside this afternoon.  Comfortable.  Hemodynamically stable.  Complains of neck pain but otherwise no new complaints  Objective: Vitals:   08/31/19 2342 09/01/19 0330 09/01/19 0904 09/01/19 1140  BP: 109/68 100/74 99/66 (!) 103/64  Pulse: 92 90 92 92  Resp: 18 19 18 18   Temp: 98.3 F (36.8 C) 98.3 F (36.8 C) 98.3 F (36.8 C) 98.4 F (36.9 C)  TempSrc: Oral Oral Oral Oral  SpO2: 100% 98%  100%  Weight:      Height:        Intake/Output Summary (Last 24 hours) at 09/01/2019 1507 Last data filed at 09/01/2019 0700 Gross per 24 hour  Intake 760 ml  Output --  Net 760 ml   Filed Weights   08/07/19 2205 08/08/19 1253 08/08/19 1823  Weight: 59 kg 59 kg 61.2 kg    Examination:  General exam: Appears calm and comfortable ,Not in distress,average built HEENT:PERRL,Oral mucosa moist, Ear/Nose normal on gross exam, surgical scar on the cervical region Respiratory system: Bilateral equal air entry, normal vesicular breath sounds, no wheezes or crackles  Cardiovascular system: S1 & S2 heard, RRR. No JVD, murmurs, rubs, gallops or clicks. No pedal edema. Gastrointestinal system: Abdomen is nondistended, soft and nontender. No organomegaly or masses felt. Normal bowel sounds heard. Central nervous system: Alert and oriented. Extremities: No edema, no clubbing ,no cyanosis Skin: No rashes, lesions or ulcers,no icterus ,no pallor   Data Reviewed: I have personally reviewed following labs and imaging studies  CBC: Recent Labs  Lab 08/27/19 0417 08/29/19 1233 08/30/19 0220 09/01/19 0316  WBC 4.4 4.9 4.2 4.5  NEUTROABS  --  2.7 1.9 2.0  HGB 10.6* 11.0* 10.4* 10.6*  HCT 32.8* 34.8* 32.9* 33.0*  MCV 93.4 92.6 92.4 92.7  PLT 605* 466* 418* 465   Basic Metabolic Panel: Recent Labs  Lab 08/27/19 0417 08/29/19 1233 08/30/19 0220 09/01/19 0316  NA 138 138  139 137  K 4.5 3.9 3.9 4.2  CL 100 101 101 99  CO2 31 28 29 28   GLUCOSE 111* 109* 136* 111*  BUN 6 <5* <5* 6  CREATININE 0.56 0.45 0.45 0.51  CALCIUM 9.3 9.5 9.2 9.1  MG  --   --  1.6* 1.6*   GFR: Estimated Creatinine Clearance: 88.5 mL/min (by C-G formula based on SCr of 0.51 mg/dL). Liver Function Tests: Recent Labs  Lab 08/27/19 0417 08/29/19 1233 08/30/19 0220 09/01/19 0316  AST 226* 315* 265* 341*  ALT 161* 214* 195* 236*  ALKPHOS 157* 192* 191* 230*  BILITOT 0.5 0.5 0.4 0.5  PROT 6.2* 7.0  6.4* 6.3*  ALBUMIN 2.7* 2.9* 2.7* 2.7*   No results for input(s): LIPASE, AMYLASE in the last 168 hours. No results for input(s): AMMONIA in the last 168 hours. Coagulation Profile: Recent Labs  Lab 08/29/19 1233  INR 1.0   Cardiac Enzymes: No results for input(s): CKTOTAL, CKMB, CKMBINDEX, TROPONINI in the last 168 hours. BNP (last 3 results) No results for input(s): PROBNP in the last 8760 hours. HbA1C: No results for input(s): HGBA1C in the last 72 hours. CBG: No results for input(s): GLUCAP in the last 168 hours. Lipid Profile: No results for input(s): CHOL, HDL, LDLCALC, TRIG, CHOLHDL, LDLDIRECT in the last 72 hours. Thyroid Function Tests: Recent Labs    08/30/19 0220  TSH 4.094   Anemia Panel: No results for input(s): VITAMINB12, FOLATE, FERRITIN, TIBC, IRON, RETICCTPCT in the last 72 hours. Sepsis Labs: No results for input(s): PROCALCITON, LATICACIDVEN in the last 168 hours.  No results found for this or any previous visit (from the past 240 hour(s)).       Radiology Studies: No results found.      Scheduled Meds: . Chlorhexidine Gluconate Cloth  6 each Topical Daily  . enoxaparin (LOVENOX) injection  40 mg Subcutaneous Q24H  . feeding supplement  1 Container Oral TID BM  . folic acid  1 mg Oral Daily  . morphine  45 mg Oral Q12H  . multivitamin with minerals  1 tablet Oral Daily  . nicotine  14 mg Transdermal Daily  . senna-docusate  1  tablet Oral BID  . sodium chloride flush  10-40 mL Intracatheter Q12H  . thiamine  100 mg Oral Daily   Continuous Infusions: . sodium chloride 10 mL/hr at 08/24/19 2340  .  ceFAZolin (ANCEF) IV 2 g (09/01/19 0622)     LOS: 24 days    Time spent: 25 mins.More than 50% of that time was spent in counseling and/or coordination of care.      Shelly Coss, MD [facility] P7/26/2021, 3:07 PM

## 2019-09-01 NOTE — Progress Notes (Signed)
Nutrition Follow-up  DOCUMENTATION CODES:   Non-severe (moderate) malnutrition in context of social or environmental circumstances  INTERVENTION:   - Continue Boost Breeze po TID, each supplement provides 250 kcal and 9 grams of protein  - Continue MVI with minerals daily  - Continue drinking protein smoothie provided by family  NUTRITION DIAGNOSIS:   Moderate Malnutrition related to social / environmental circumstances as evidenced by mild fat depletion, mild muscle depletion, moderate muscle depletion, energy intake < 75% for > or equal to 3 months.  Ongoing  GOAL:   Patient will meet greater than or equal to 90% of their needs  Progressing  MONITOR:   PO intake, Supplement acceptance, Labs, I & O's, Weight trends  REASON FOR ASSESSMENT:   LOS    ASSESSMENT:   Pt admitted with severe neck pain 2/2 acute cervical discitis/osteomyelitisMSSA bacteremia, POA, pt now s/p C3-7 lami C3-6 PSIF. PMH significant for IVDU and EtOH abuse.  Pt continues to be hospitalized for IV abx. Pt eating well and taking oral nutrition supplements.   Meal Completion: typically >75%  Medications reviewed and include:  folic acid, MVI with minerals, thiamine Labs reviewed. LFTs elevated   Diet Order:   Diet Order            Diet regular Room service appropriate? Yes; Fluid consistency: Thin  Diet effective now                 EDUCATION NEEDS:   Education needs have been addressed  Skin:  Skin Assessment: Skin Integrity Issues: Incisions: neck  Last BM:  7/26  Height:   Ht Readings from Last 1 Encounters:  08/08/19 5\' 7"  (1.702 m)    Weight:   Wt Readings from Last 1 Encounters:  08/08/19 61.2 kg    BMI:  Body mass index is 21.13 kg/m.  Estimated Nutritional Needs:   Kcal:  1850-2050  Protein:  95-105 grams  Fluid:  >1.8L/d  Samer Dutton P., RD, LDN, CNSC See AMiON for contact information

## 2019-09-02 LAB — COMPREHENSIVE METABOLIC PANEL
ALT: 245 U/L — ABNORMAL HIGH (ref 0–44)
AST: 390 U/L — ABNORMAL HIGH (ref 15–41)
Albumin: 2.6 g/dL — ABNORMAL LOW (ref 3.5–5.0)
Alkaline Phosphatase: 245 U/L — ABNORMAL HIGH (ref 38–126)
Anion gap: 7 (ref 5–15)
BUN: <5 mg/dL — ABNORMAL LOW (ref 6–20)
CO2: 29 mmol/L (ref 22–32)
Calcium: 8.9 mg/dL (ref 8.9–10.3)
Chloride: 100 mmol/L (ref 98–111)
Creatinine, Ser: 0.52 mg/dL (ref 0.44–1.00)
GFR calc Af Amer: >60 mL/min (ref >60)
GFR calc non Af Amer: >60 mL/min (ref >60)
Glucose, Bld: 107 mg/dL — ABNORMAL HIGH (ref 70–99)
Potassium: 4.4 mmol/L (ref 3.5–5.1)
Sodium: 136 mmol/L (ref 135–145)
Total Bilirubin: 0.3 mg/dL (ref 0.3–1.2)
Total Protein: 6 g/dL — ABNORMAL LOW (ref 6.5–8.1)

## 2019-09-02 LAB — MAGNESIUM: Magnesium: 1.7 mg/dL (ref 1.7–2.4)

## 2019-09-02 MED ORDER — MAGNESIUM OXIDE 400 (241.3 MG) MG PO TABS
400.0000 mg | ORAL_TABLET | Freq: Every day | ORAL | Status: DC
  Administered 2019-09-02 – 2019-09-05 (×4): 400 mg via ORAL
  Filled 2019-09-02 (×4): qty 1

## 2019-09-02 MED ORDER — LACTATED RINGERS IV BOLUS
1000.0000 mL | Freq: Once | INTRAVENOUS | Status: AC
  Administered 2019-09-02: 1000 mL via INTRAVENOUS

## 2019-09-02 NOTE — Progress Notes (Signed)
OT Cancellation Note  Patient Details Name: Monica Eaton MRN: [id number] DOB: [birth year]   Cancelled Treatment:    Reason Eval/Treat Not Completed: Pain limiting ability to participate; Will follow up when able.  Aayushi Solorzano/OTS Terryl Niziolek 09/02/2019, 2:05 PM

## 2019-09-02 NOTE — Progress Notes (Signed)
BP 92/57 asymptomatic. [place] updated. Pt educated on the effects of opiates and BP.

## 2019-09-02 NOTE — Progress Notes (Signed)
Physical Therapy Treatment Patient Details Name: Monica Eaton MRN: [id number] DOB: 09/29/1975 Today's Date: 09/02/2019    History of Present Illness Monica Eaton is a 44 y.o. female with medical history significant for tobacco abuse and IV drug abuse who presents to the emergency department due to sudden onset of upper back pain which started about 3 -4 hours PTA.  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 Pt underwent, s/p C3-6 OLaminectomy & fusion. Pt is remaining hospitalized for IV antibiotics.    PT Comments    Patient progressing with ambulation in hallway this session and able to negotiate steps.  Remains weak and with pain in spine so initiated stabilization/core strengthening gently.  She will continue to benefit from skilled PT until d/c.    Follow Up Recommendations  Home health PT;Supervision/Assistance - 24 hour     Equipment Recommendations  Rolling walker with 5" wheels    Recommendations for Other Services       Precautions / Restrictions Precautions Precautions: Cervical;Fall Cervical Brace: Soft collar;For comfort Restrictions Other Position/Activity Restrictions: pt not wearing sofl collar today    Mobility  Bed Mobility Overal bed mobility: Modified Independent             General bed mobility comments: no physical assist needed, + use of rail and HOB up  Transfers   Equipment used: Rolling walker (2 wheeled) Transfers: Sit to/from Stand Sit to Stand: Supervision         General transfer comment: S for safety, good knowledge of precautions  Ambulation/Gait Ambulation/Gait assistance: Supervision Gait Distance (Feet): 180 Feet Assistive device: Rolling walker (2 wheeled) Gait Pattern/deviations: Step-through pattern;Decreased stride length     General Gait Details: good use of RW for stability and due to pain   Stairs Stairs: Yes Stairs assistance: Min  guard;Supervision Stair Management: Step to pattern;Two rails;Forwards Number of Stairs: 3     Wheelchair Mobility    Modified Rankin (Stroke Patients Only)       Balance Overall balance assessment: Needs assistance   Sitting balance-Leahy Scale: Good       Standing balance-Leahy Scale: Fair Standing balance comment: UE support for ambulation                            Cognition Arousal/Alertness: Awake/alert Behavior During Therapy: WFL for tasks assessed/performed Overall Cognitive Status: Within Functional Limits for tasks assessed                                        Exercises Other Exercises Other Exercises: seated rows wtih yellow t-band x 10 encouragement for strengthening upper back/spine and UE's    General Comments General comments (skin integrity, edema, etc.): contnues to report numbness in hands and legs      Pertinent Vitals/Pain Pain Score: 8  Pain Location: neck and back Pain Descriptors / Indicators: Aching;Guarding Pain Intervention(s): Monitored during session;Repositioned    Home Living                      Prior Function            PT Goals (current goals can now be found in the care plan section) Progress towards PT goals: Progressing toward goals    Frequency    Min 3X/week      PT  Plan Current plan remains appropriate    Co-evaluation              AM-PAC PT "6 Clicks" Mobility   Outcome Measure  Help needed turning from your back to your side while in a flat bed without using bedrails?: None Help needed moving from lying on your back to sitting on the side of a flat bed without using bedrails?: None Help needed moving to and from a bed to a chair (including a wheelchair)?: None Help needed standing up from a chair using your arms (e.g., wheelchair or bedside chair)?: None Help needed to walk in hospital room?: A Little Help needed climbing 3-5 steps with a railing? : A  Little 6 Click Score: 22    End of Session   Activity Tolerance: Patient tolerated treatment well Patient left: in bed;with call bell/phone within reach   PT Visit Diagnosis: Unsteadiness on feet (R26.81);Difficulty in walking, not elsewhere classified (R26.2)     Time: 6195-0932 PT Time Calculation (min) (ACUTE ONLY): 21 min  Charges:  $Gait Training: 8-22 mins                     Magda Kiel, PT Acute Rehabilitation Services Pager:[phone] [phone] 09/02/2019    [name] 09/02/2019, 5:12 PM

## 2019-09-02 NOTE — Progress Notes (Signed)
Pt's midline leaking mid bolus infusion. IV consult placed.

## 2019-09-02 NOTE — Progress Notes (Signed)
PROGRESS NOTE    Monica Eaton  UXL:[id number] DOB: 12-20-1975 DOA: 08/07/2019 PCP: Patient, No Pcp Per   Brief Narrative:  Patient is a 44 year old female with history of IV drug abuse, tobacco abuse who presents with severe neck pain and upper back pain.  In the emergency department she was found to have leukocytosis, elevated CRP.  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  Neurosurgery was consulted and she has required surgical intervention on 08/08/2019.  She was found to have MSSA bacteremia and started on IV antibiotics.  Plan is to complete IV  antibiotic course during this hospitalization.  Assessment & Plan:   Principal Problem:   Discitis of cervical region Active Problems:   Cervical pain (neck)   IVDU (intravenous drug user)   Tobacco abuse   Neck pain   Bandemia   MSSA bacteremia   Epidural abscess   Hardware complicating wound infection ([place])   Malnutrition of moderate degree   MSSA cervical discitis/osteomyelitis: Status post C3-7 laminectomy , C3-6 PSIF.  Neurosurgery was following.  She will follow-up with neurosurgery as an outpatient.  MSSA bacteremia: Currently on cefazolin.  Plan is to continue 4 to 5 weeks of IV cefazolin as an inpatient and switch to oral therapy.  Rifampin has been stopped due to elevated liver enzymes.  2D echo did not show any vegetations.  She needs long-term oral antibiotics given placement of hardware.  We will recall ID on August 1 for possible consideration of changing antibiotics to oral.  IV drug use: Counseled for cessation.  HIV, hepatitis B and hepatitis C negative.  UDS was positive for benzodiazepines, opiates, THC.  Patient is also a smoker.  Continue nicotine patch.  Elevated LFTs: Liver ultrasound unremarkable.  Suspected to be from rifampin.  Continue to monitor LFTs.  Hepatitis panel negative.Check daily LFTs.  She does not have any  right upper quadrant tenderness or hepatomegaly.  Neck pain/back pain: On MS Contin, Flexeril.  Continue supportive care  Normocytic anemia: Currently hemoglobin stable.  Hypomagnesemia: Supplemented  Nutrition Problem: Moderate Malnutrition Etiology: social / environmental circumstances      DVT prophylaxis: Lovenox Code Status: Full code Family Communication: None present at the bedside Status is: Inpatient  Remains inpatient appropriate because:IV treatments appropriate due to intensity of illness or inability to take PO   Dispo: The patient is from: Home              Anticipated d/c is to: Home              Anticipated d/c date is: > 3 days              Patient currently is not medically stable to d/c.      Consultants: Neuro surgery, ID  Procedures: Cervical laminectomy and fusion  Antimicrobials:  Anti-infectives (From admission, onward)   Start     Dose/Rate Route Frequency Ordered Stop   08/12/19 1700  rifampin (RIFADIN) capsule 300 mg  Status:  Discontinued        300 mg Oral 2 times daily with meals 08/12/19 1353 08/28/19 1415   08/11/19 1400  ceFAZolin (ANCEF) IVPB 2g/100 mL premix     Discontinue     2 g 200 mL/hr over 30 Minutes Intravenous Every 8 hours 08/11/19 1159     08/10/19 1400  vancomycin (VANCOREADY) IVPB 750 mg/150 mL  Status:  Discontinued  750 mg 150 mL/hr over 60 Minutes Intravenous Every 8 hours 08/10/19 1254 08/11/19 1159   08/08/19 2042  bacitracin 50,000 Units in sodium chloride 0.9 % 500 mL irrigation  Status:  Discontinued          As needed 08/08/19 2043 08/08/19 2350   08/08/19 1800  vancomycin (VANCOCIN) IVPB 1000 mg/200 mL premix  Status:  Discontinued        1,000 mg 200 mL/hr over 60 Minutes Intravenous Every 12 hours 08/08/19 0644 08/10/19 0919   08/08/19 0800  levofloxacin (LEVAQUIN) IVPB 750 mg  Status:  Discontinued        750 mg 100 mL/hr over 90 Minutes Intravenous Every 24 hours 08/08/19 0644 08/09/19 0115    08/08/19 0645  vancomycin (VANCOREADY) IVPB 1250 mg/250 mL        1,250 mg 166.7 mL/hr over 90 Minutes Intravenous  Once 08/08/19 9326 08/08/19 0916      Subjective: Patient seen and examined at the bedside this morning.  Hemodynamically stable.  Comfortable.  Sleeping when I entered the room.  Complains of neck pain at the surgical site.  Objective: Vitals:   09/01/19 2350 09/02/19 0336 09/02/19 0454 09/02/19 0745  BP: 108/70 94/81 (!) 92/57 116/69  Pulse: 89 98 86 98  Resp:  17  16  Temp:  98.3 F (36.8 C)  98.2 F (36.8 C)  TempSrc:  Oral  Oral  SpO2:  96%  100%  Weight:      Height:        Intake/Output Summary (Last 24 hours) at 09/02/2019 0840 Last data filed at 09/02/2019 0555 Gross per 24 hour  Intake 100 ml  Output --  Net 100 ml   Filed Weights   08/07/19 2205 08/08/19 1253 08/08/19 1823  Weight: 59 kg 59 kg 61.2 kg    Examination:    General exam: Appears calm and comfortable ,Not in distress,average built HEENT:PERRL,Oral mucosa moist, Ear/Nose normal on gross exam, surgical scar on the back of the neck Respiratory system: Bilateral equal air entry, normal vesicular breath sounds, no wheezes or crackles  Cardiovascular system: S1 & S2 heard, RRR. No JVD, murmurs, rubs, gallops or clicks. Gastrointestinal system: Abdomen is nondistended, soft and nontender. No organomegaly or masses felt. Normal bowel sounds heard. Central nervous system: Alert and oriented. No focal neurological deficits. Extremities: No edema, no clubbing ,no cyanosis, distal peripheral pulses palpable. Skin: No rashes, lesions or ulcers,no icterus ,no pallor   Data Reviewed: I have personally reviewed following labs and imaging studies  CBC: Recent Labs  Lab 08/27/19 0417 08/29/19 1233 08/30/19 0220 09/01/19 0316  WBC 4.4 4.9 4.2 4.5  NEUTROABS  --  2.7 1.9 2.0  HGB 10.6* 11.0* 10.4* 10.6*  HCT 32.8* 34.8* 32.9* 33.0*  MCV 93.4 92.6 92.4 92.7  PLT 605* 466* 418* 712    Basic Metabolic Panel: Recent Labs  Lab 08/27/19 0417 08/29/19 1233 08/30/19 0220 09/01/19 0316 09/02/19 0617  NA 138 138 139 137 136  K 4.5 3.9 3.9 4.2 4.4  CL 100 101 101 99 100  CO2 31 28 29 28 29   GLUCOSE 111* 109* 136* 111* 107*  BUN 6 <5* <5* 6 <5*  CREATININE 0.56 0.45 0.45 0.51 0.52  CALCIUM 9.3 9.5 9.2 9.1 8.9  MG  --   --  1.6* 1.6* 1.7   GFR: Estimated Creatinine Clearance: 88.5 mL/min (by C-G formula based on SCr of 0.52 mg/dL). Liver Function Tests: Recent Labs  Lab 08/27/19 [phone]  08/29/19 1233 08/30/19 0220 09/01/19 0316 09/02/19 0617  AST 226* 315* 265* 341* 390*  ALT 161* 214* 195* 236* 245*  ALKPHOS 157* 192* 191* 230* 245*  BILITOT 0.5 0.5 0.4 0.5 0.3  PROT 6.2* 7.0 6.4* 6.3* 6.0*  ALBUMIN 2.7* 2.9* 2.7* 2.7* 2.6*   No results for input(s): LIPASE, AMYLASE in the last 168 hours. No results for input(s): AMMONIA in the last 168 hours. Coagulation Profile: Recent Labs  Lab 08/29/19 1233  INR 1.0   Cardiac Enzymes: No results for input(s): CKTOTAL, CKMB, CKMBINDEX, TROPONINI in the last 168 hours. BNP (last 3 results) No results for input(s): PROBNP in the last 8760 hours. HbA1C: No results for input(s): HGBA1C in the last 72 hours. CBG: No results for input(s): GLUCAP in the last 168 hours. Lipid Profile: No results for input(s): CHOL, HDL, LDLCALC, TRIG, CHOLHDL, LDLDIRECT in the last 72 hours. Thyroid Function Tests: No results for input(s): TSH, T4TOTAL, FREET4, T3FREE, THYROIDAB in the last 72 hours. Anemia Panel: No results for input(s): VITAMINB12, FOLATE, FERRITIN, TIBC, IRON, RETICCTPCT in the last 72 hours. Sepsis Labs: No results for input(s): PROCALCITON, LATICACIDVEN in the last 168 hours.  No results found for this or any previous visit (from the past 240 hour(s)).       Radiology Studies: No results found.      Scheduled Meds: . Chlorhexidine Gluconate Cloth  6 each Topical Daily  . enoxaparin (LOVENOX)  injection  40 mg Subcutaneous Q24H  . feeding supplement  1 Container Oral TID BM  . folic acid  1 mg Oral Daily  . morphine  45 mg Oral Q12H  . multivitamin with minerals  1 tablet Oral Daily  . nicotine  14 mg Transdermal Daily  . senna-docusate  1 tablet Oral BID  . sodium chloride flush  10-40 mL Intracatheter Q12H  . thiamine  100 mg Oral Daily   Continuous Infusions: . sodium chloride 10 mL/hr at 08/24/19 2340  .  ceFAZolin (ANCEF) IV 2 g (09/02/19 0508)     LOS: 25 days    Time spent: 25 mins.More than 50% of that time was spent in counseling and/or coordination of care.      Shelly Coss, MD [facility] P7/27/2021, 8:40 AM

## 2019-09-02 NOTE — Plan of Care (Signed)

## 2019-09-03 ENCOUNTER — Inpatient Hospital Stay (HOSPITAL_COMMUNITY): Payer: Medicaid Other

## 2019-09-03 DIAGNOSIS — K759 Inflammatory liver disease, unspecified: Secondary | ICD-10-CM

## 2019-09-03 DIAGNOSIS — R7989 Other specified abnormal findings of blood chemistry: Secondary | ICD-10-CM

## 2019-09-03 DIAGNOSIS — B182 Chronic viral hepatitis C: Secondary | ICD-10-CM

## 2019-09-03 LAB — COMPREHENSIVE METABOLIC PANEL
ALT: 271 U/L — ABNORMAL HIGH (ref 0–44)
AST: 421 U/L — ABNORMAL HIGH (ref 15–41)
Albumin: 2.7 g/dL — ABNORMAL LOW (ref 3.5–5.0)
Alkaline Phosphatase: 281 U/L — ABNORMAL HIGH (ref 38–126)
Anion gap: 8 (ref 5–15)
BUN: <5 mg/dL — ABNORMAL LOW (ref 6–20)
CO2: 29 mmol/L (ref 22–32)
Calcium: 9 mg/dL (ref 8.9–10.3)
Chloride: 101 mmol/L (ref 98–111)
Creatinine, Ser: 0.5 mg/dL (ref 0.44–1.00)
GFR calc Af Amer: >60 mL/min (ref >60)
GFR calc non Af Amer: >60 mL/min (ref >60)
Glucose, Bld: 112 mg/dL — ABNORMAL HIGH (ref 70–99)
Potassium: 4.1 mmol/L (ref 3.5–5.1)
Sodium: 138 mmol/L (ref 135–145)
Total Bilirubin: 0.5 mg/dL (ref 0.3–1.2)
Total Protein: 6.4 g/dL — ABNORMAL LOW (ref 6.5–8.1)

## 2019-09-03 LAB — RAPID URINE DRUG SCREEN, HOSP PERFORMED
Amphetamines: NOT DETECTED
Barbiturates: NOT DETECTED
Benzodiazepines: NOT DETECTED
Cocaine: NOT DETECTED
Opiates: POSITIVE — AB
Tetrahydrocannabinol: NOT DETECTED

## 2019-09-03 LAB — HEPATITIS A ANTIBODY, IGM: Hep A IgM: NONREACTIVE

## 2019-09-03 LAB — HEPATITIS B SURFACE ANTIGEN: Hepatitis B Surface Ag: NONREACTIVE

## 2019-09-03 MED ORDER — SODIUM CHLORIDE 0.9 % IV SOLN: INTRAVENOUS | Status: DC

## 2019-09-03 MED ORDER — DOXYCYCLINE HYCLATE 100 MG PO TABS
100.0000 mg | ORAL_TABLET | Freq: Two times a day (BID) | ORAL | Status: DC
  Administered 2019-09-03 – 2019-09-08 (×10): 100 mg via ORAL
  Filled 2019-09-03 (×10): qty 1

## 2019-09-03 NOTE — Progress Notes (Signed)
Subjective:  Patient still having some neck pain but is much better  Antibiotics:  Anti-infectives (From admission, onward)   Start     Dose/Rate Route Frequency Ordered Stop   09/03/19 2200  doxycycline (VIBRA-TABS) tablet 100 mg     Discontinue     100 mg Oral Every 12 hours 09/03/19 1445     08/12/19 1700  rifampin (RIFADIN) capsule 300 mg  Status:  Discontinued        300 mg Oral 2 times daily with meals 08/12/19 1353 08/28/19 1415   08/11/19 1400  ceFAZolin (ANCEF) IVPB 2g/100 mL premix  Status:  Discontinued        2 g 200 mL/hr over 30 Minutes Intravenous Every 8 hours 08/11/19 1159 09/03/19 1445   08/10/19 1400  vancomycin (VANCOREADY) IVPB 750 mg/150 mL  Status:  Discontinued        750 mg 150 mL/hr over 60 Minutes Intravenous Every 8 hours 08/10/19 1254 08/11/19 1159   08/08/19 2042  bacitracin 50,000 Units in sodium chloride 0.9 % 500 mL irrigation  Status:  Discontinued          As needed 08/08/19 2043 08/08/19 2350   08/08/19 1800  vancomycin (VANCOCIN) IVPB 1000 mg/200 mL premix  Status:  Discontinued        1,000 mg 200 mL/hr over 60 Minutes Intravenous Every 12 hours 08/08/19 0644 08/10/19 0919   08/08/19 0800  levofloxacin (LEVAQUIN) IVPB 750 mg  Status:  Discontinued        750 mg 100 mL/hr over 90 Minutes Intravenous Every 24 hours 08/08/19 0644 08/09/19 0115   08/08/19 0645  vancomycin (VANCOREADY) IVPB 1250 mg/250 mL        1,250 mg 166.7 mL/hr over 90 Minutes Intravenous  Once 08/08/19 6734 08/08/19 0916      Medications: Scheduled Meds: . Chlorhexidine Gluconate Cloth  6 each Topical Daily  . doxycycline  100 mg Oral Q12H  . enoxaparin (LOVENOX) injection  40 mg Subcutaneous Q24H  . feeding supplement  1 Container Oral TID BM  . folic acid  1 mg Oral Daily  . magnesium oxide  400 mg Oral Daily  . morphine  45 mg Oral Q12H  . multivitamin with minerals  1 tablet Oral Daily  . nicotine  14 mg Transdermal Daily  . senna-docusate  1 tablet  Oral BID  . sodium chloride flush  10-40 mL Intracatheter Q12H  . thiamine  100 mg Oral Daily   Continuous Infusions: . sodium chloride 10 mL/hr at 08/24/19 2340  . sodium chloride 100 mL/hr at 09/03/19 1030   PRN Meds:.sodium chloride, acetaminophen **OR** [DISCONTINUED] acetaminophen, bisacodyl, cyclobenzaprine, HYDROmorphone (DILAUDID) injection, oxyCODONE, polyethylene glycol, sodium chloride flush, traZODone    Objective: Weight change:   Intake/Output Summary (Last 24 hours) at 09/03/2019 1449 Last data filed at 09/02/2019 1532 Gross per 24 hour  Intake 103.81 ml  Output --  Net 103.81 ml   Blood pressure 117/85, pulse 91, temperature 98 F (36.7 C), temperature source Oral, resp. rate 18, height 5\' 7"  (1.702 m), weight 61.2 kg, SpO2 99 %. Temp:  [98 F (36.7 C)-98.4 F (36.9 C)] 98 F (36.7 C) (07/28 1142) Pulse Rate:  [90-96] 91 (07/28 1142) Resp:  [16-18] 18 (07/28 1142) BP: (102-117)/(67-85) 117/85 (07/28 1142) SpO2:  [98 %-100 %] 99 % (07/28 1142)  Physical Exam: General: Alert and awake, oriented x3,  HEENT: anicteric sclera, EOMI CVS regular rate,  Chest: ,  no wheezing, no respiratory distress clear to auscultation bilaterally Abdomen: soft non-distended, no tenderness Extremities: no edema or deformity noted bilaterally Skin: no rashes Neuro: nonfocal  CBC:    BMET Recent Labs    09/02/19 0617 09/03/19 0313  NA 136 138  K 4.4 4.1  CL 100 101  CO2 29 29  GLUCOSE 107* 112*  BUN <5* <5*  CREATININE 0.52 0.50  CALCIUM 8.9 9.0     Liver Panel  Recent Labs    09/02/19 0617 09/03/19 0313  PROT 6.0* 6.4*  ALBUMIN 2.6* 2.7*  AST 390* 421*  ALT 245* 271*  ALKPHOS 245* 281*  BILITOT 0.3 0.5       Sedimentation Rate No results for input(s): ESRSEDRATE in the last 72 hours. C-Reactive Protein No results for input(s): CRP in the last 72 hours.  Micro Results: Recent Results (from the past 720 hour(s))  Culture, blood (Routine X 2) w  Reflex to ID Panel     Status: Abnormal   Collection Time: 08/08/19  7:27 AM   Specimen: BLOOD RIGHT FOREARM  Result Value Ref Range Status   Specimen Description BLOOD RIGHT FOREARM  Final   Special Requests   Final    BOTTLES DRAWN AEROBIC AND ANAEROBIC Blood Culture results may not be optimal due to an inadequate volume of blood received in culture bottles   Culture  Setup Time   Final    IN BOTH AEROBIC AND ANAEROBIC BOTTLES GRAM POSITIVE COCCI IN CLUSTERS CRITICAL RESULT CALLED TO, READ BACK BY AND VERIFIED WITH: Karsten Ro [facility] 08/09/19 0102 JDW Performed at [facility], [address] [place], [place] [address]    Culture STAPHYLOCOCCUS AUREUS (A)  Final   Report Status 08/10/2019 FINAL  Final   Organism ID, Bacteria STAPHYLOCOCCUS AUREUS  Final      Susceptibility   Staphylococcus aureus - MIC*    CIPROFLOXACIN <=0.5 SENSITIVE Sensitive     ERYTHROMYCIN >=8 RESISTANT Resistant     GENTAMICIN <=0.5 SENSITIVE Sensitive     OXACILLIN 0.5 SENSITIVE Sensitive     TETRACYCLINE <=1 SENSITIVE Sensitive     VANCOMYCIN <=0.5 SENSITIVE Sensitive     TRIMETH/SULFA <=10 SENSITIVE Sensitive     CLINDAMYCIN <=0.25 SENSITIVE Sensitive     RIFAMPIN <=0.5 SENSITIVE Sensitive     Inducible Clindamycin NEGATIVE Sensitive     * STAPHYLOCOCCUS AUREUS  Blood Culture ID Panel (Reflexed)     Status: Abnormal   Collection Time: 08/08/19  7:27 AM  Result Value Ref Range Status   Enterococcus species NOT DETECTED NOT DETECTED Final   Listeria monocytogenes NOT DETECTED NOT DETECTED Final   Staphylococcus species DETECTED (A) NOT DETECTED Final    Comment: CRITICAL RESULT CALLED TO, READ BACK BY AND VERIFIED WITH: J LEDFORD PHARMD 08/09/19 0102 JDW    Staphylococcus aureus (BCID) DETECTED (A) NOT DETECTED Final    Comment: Methicillin (oxacillin) susceptible Staphylococcus aureus (MSSA). Preferred therapy is anti staphylococcal beta lactam antibiotic (Cefazolin or Nafcillin), unless clinically  contraindicated. CRITICAL RESULT CALLED TO, READ BACK BY AND VERIFIED WITH: J LEDFORD [facility] 08/09/19 0102 JDW    Methicillin resistance NOT DETECTED NOT DETECTED Final   Streptococcus species NOT DETECTED NOT DETECTED Final   Streptococcus agalactiae NOT DETECTED NOT DETECTED Final   Streptococcus pneumoniae NOT DETECTED NOT DETECTED Final   Streptococcus pyogenes NOT DETECTED NOT DETECTED Final   Acinetobacter baumannii NOT DETECTED NOT DETECTED Final   Enterobacteriaceae species NOT DETECTED NOT DETECTED Final  Enterobacter cloacae complex NOT DETECTED NOT DETECTED Final   Escherichia coli NOT DETECTED NOT DETECTED Final   Klebsiella oxytoca NOT DETECTED NOT DETECTED Final   Klebsiella pneumoniae NOT DETECTED NOT DETECTED Final   Proteus species NOT DETECTED NOT DETECTED Final   Serratia marcescens NOT DETECTED NOT DETECTED Final   Haemophilus influenzae NOT DETECTED NOT DETECTED Final   Neisseria meningitidis NOT DETECTED NOT DETECTED Final   Pseudomonas aeruginosa NOT DETECTED NOT DETECTED Final   Candida albicans NOT DETECTED NOT DETECTED Final   Candida glabrata NOT DETECTED NOT DETECTED Final   Candida krusei NOT DETECTED NOT DETECTED Final   Candida parapsilosis NOT DETECTED NOT DETECTED Final   Candida tropicalis NOT DETECTED NOT DETECTED Final    Comment: Performed at [facility], [place] [address], [place], [place] [address]  SARS Coronavirus 2 by RT PCR (hospital order, performed in [facility] hospital lab) Nasopharyngeal Nasopharyngeal Swab     Status: None   Collection Time: 08/08/19  9:02 AM   Specimen: Nasopharyngeal Swab  Result Value Ref Range Status   SARS Coronavirus 2 NEGATIVE NEGATIVE Final    Comment: (NOTE) SARS-CoV-2 target nucleic acids are NOT DETECTED.  The SARS-CoV-2 RNA is generally detectable in upper and lower respiratory specimens during the acute phase of infection. The lowest concentration of SARS-CoV-2 viral copies this assay can detect is  250 copies / mL. A negative result does not preclude SARS-CoV-2 infection and should not be used as the sole basis for treatment or other patient management decisions.  A negative result may occur with improper specimen collection / handling, submission of specimen other than nasopharyngeal swab, presence of viral mutation(s) within the areas targeted by this assay, and inadequate number of viral copies (<250 copies / mL). A negative result must be combined with clinical observations, patient history, and epidemiological information.  Fact Sheet for Patients:   StrictlyIdeas.no  Fact Sheet for Healthcare Providers: BankingDealers.co.za  This test is not yet approved or  cleared by the [place] FDA and has been authorized for detection and/or diagnosis of SARS-CoV-2 by FDA under an Emergency Use Authorization (EUA).  This EUA will remain in effect (meaning this test can be used) for the duration of the COVID-19 declaration under Section 564(b)(1) of the Act, 21 U.S.C. section 360bbb-3(b)(1), unless the authorization is terminated or revoked sooner.  Performed at [facility], [place] [address], [place], [place] [address]   Culture, blood (Routine X 2) w Reflex to ID Panel     Status: Abnormal   Collection Time: 08/08/19  5:11 PM   Specimen: BLOOD RIGHT HAND  Result Value Ref Range Status   Specimen Description BLOOD RIGHT HAND  Final   Special Requests   Final    BOTTLES DRAWN AEROBIC AND ANAEROBIC Blood Culture adequate volume   Culture  Setup Time   Final    GRAM POSITIVE COCCI IN CLUSTERS IN BOTH AEROBIC AND ANAEROBIC BOTTLES CRITICAL RESULT CALLED TO, READ BACK BY AND VERIFIED WITH: J. LEDFORD,PHARMD 0503 08/09/2019 T. TYSOR    Culture (A)  Final    STAPHYLOCOCCUS AUREUS SUSCEPTIBILITIES PERFORMED ON PREVIOUS CULTURE WITHIN THE LAST 5 DAYS. Performed at [facility], [place] [address] [place], [place] [address]      Report Status 08/11/2019 FINAL  Final  Culture, blood (routine x 2)     Status: None   Collection Time: 08/10/19  7:04 AM   Specimen: BLOOD  Result Value Ref Range Status  Specimen Description BLOOD RIGHT ANTECUBITAL  Final   Special Requests   Final    BOTTLES DRAWN AEROBIC ONLY Blood Culture results may not be optimal due to an inadequate volume of blood received in culture bottles   Culture   Final    NO GROWTH 5 DAYS Performed at [facility], [place] [address] [place], [place] [address]    Report Status 08/15/2019 FINAL  Final  Culture, blood (routine x 2)     Status: None   Collection Time: 08/10/19  7:04 AM   Specimen: BLOOD RIGHT HAND  Result Value Ref Range Status   Specimen Description BLOOD RIGHT HAND  Final   Special Requests   Final    BOTTLES DRAWN AEROBIC ONLY Blood Culture adequate volume   Culture   Final    NO GROWTH 5 DAYS Performed at [facility], [place] [address], [place], [place] [address]    Report Status 08/15/2019 FINAL  Final    Studies/Results: No results found.    Assessment/Plan:  INTERVAL HISTORY:   Her transaminases and her alkaline phosphatase are all up and now despite rifampin being stopped roughly a week ago.   Principal Problem:   Discitis of cervical region Active Problems:   Cervical pain (neck)   IVDU (intravenous drug user)   Tobacco abuse   Neck pain   Bandemia   MSSA bacteremia   Epidural abscess   Hardware complicating wound infection ([place])   Malnutrition of moderate degree    Monica Eaton is a 44 y.o. female with MSSA bacteremia with cervical spine discitis and epidural abscess status post neurosurgery with placement of hardware  #1 MSSA bacteremia with C-spine infection and epidural abscess:   We had planned on 4-5 weeks of IV cefazolin and rifampin in-house, and then if she is continue to improve consider switch to oral therapy   Rifampin was stopped due to concerns that it was the culprit for  hepatotoxicity.  The idea of cefazolin being a hepatotoxin was raised though it is not typically 1.  That being said given her continuing rise in liver function test will make a change to oral doxycycline.  #2 hepatitis: I wonder if she is having some right-sided heart failure and that she may be potentially could have endocarditis that is now declaring itself.  We will repeat 2D echocardiogram    Perhaps there is still residual ongoing inflammation due to the rifampin.  As mentioned we will move away from cefazolin at present would stop all hepatotoxic drugs including Tylenol  Recheck hepatitis panel occluding HIV RNA and hepatitis C RNA and a tox screen I doubt she has been injecting drugs in the hospital but other people have done this in the past  Would discuss with GI   2.  IV drug use needs a plan for this long-term.  She is HIV negative hep B and hep C negative  #3 IVDU:   She has had treatment at multiple treatment centers I think 5 in total that he told me.        LOS: 26 days   Alcide Evener 09/03/2019, 2:49 PM

## 2019-09-03 NOTE — Progress Notes (Signed)
PROGRESS NOTE    Monica Eaton  IHK:[id number] DOB: [birth year] DOA: 08/07/2019 PCP: Patient, No Pcp Per   Brief Narrative:  Patient is a 44 year old female with history of IV drug abuse, tobacco abuse who presents with severe neck pain and upper back pain.  In the emergency department she was found to have leukocytosis, elevated CRP.  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  Neurosurgery was consulted and she has required surgical intervention on 08/08/2019.  She was found to have MSSA bacteremia and started on IV antibiotics.  Plan is to complete IV  antibiotic course during this hospitalization.  Assessment & Plan:   Principal Problem:   Discitis of cervical region Active Problems:   Cervical pain (neck)   IVDU (intravenous drug user)   Tobacco abuse   Neck pain   Bandemia   MSSA bacteremia   Epidural abscess   Hardware complicating wound infection ([place])   Malnutrition of moderate degree   MSSA cervical discitis/osteomyelitis: Status post C3-7 laminectomy , C3-6 PSIF.  Neurosurgery was following.  She will follow-up with neurosurgery as an outpatient.  MSSA bacteremia: Currently on cefazolin.  Plan is to continue 4 to 5 weeks of IV cefazolin as an inpatient and switch to oral therapy.  Rifampin has been stopped due to elevated liver enzymes.  2D echo did not show any vegetations.  She needs long-term oral antibiotics given placement of hardware.  We will recall ID on August 1 for possible consideration of changing antibiotics to oral.  IV drug use: Counseled for cessation.  HIV, hepatitis B and hepatitis C negative.  UDS was positive for benzodiazepines, opiates, THC.  Patient is also a smoker.  Continue nicotine patch.  Elevated LFTs: Trending up.Liver ultrasound unremarkable.  Suspected to be from rifampin.  Continue to monitor LFTs.  Hepatitis panel negative.Check daily LFTs.  She does  not have any right upper quadrant tenderness or hepatomegaly.Will try some IV fluids  Neck pain/back pain: On MS Contin, Flexeril.  Continue supportive care  Normocytic anemia: Currently hemoglobin stable.  Hypomagnesemia: Supplemented  Nutrition Problem: Moderate Malnutrition Etiology: social / environmental circumstances      DVT prophylaxis: Lovenox Code Status: Full code Family Communication: called father on phone, call not received,voice mail left  Status is: Inpatient  Remains inpatient appropriate because:IV treatments appropriate due to intensity of illness or inability to take PO   Dispo: The patient is from: Home              Anticipated d/c is to: Home              Anticipated d/c date is: > 3 days              Patient currently is not medically stable to d/c.      Consultants: Neuro surgery, ID  Procedures: Cervical laminectomy and fusion  Antimicrobials:  Anti-infectives (From admission, onward)   Start     Dose/Rate Route Frequency Ordered Stop   08/12/19 1700  rifampin (RIFADIN) capsule 300 mg  Status:  Discontinued        300 mg Oral 2 times daily with meals 08/12/19 1353 08/28/19 1415   08/11/19 1400  ceFAZolin (ANCEF) IVPB 2g/100 mL premix     Discontinue     2 g 200 mL/hr over 30 Minutes Intravenous Every 8 hours 08/11/19 1159     08/10/19 1400  vancomycin (VANCOREADY) IVPB 750 mg/150 mL  Status:  Discontinued        750 mg 150 mL/hr over 60 Minutes Intravenous Every 8 hours 08/10/19 1254 08/11/19 1159   08/08/19 2042  bacitracin 50,000 Units in sodium chloride 0.9 % 500 mL irrigation  Status:  Discontinued          As needed 08/08/19 2043 08/08/19 2350   08/08/19 1800  vancomycin (VANCOCIN) IVPB 1000 mg/200 mL premix  Status:  Discontinued        1,000 mg 200 mL/hr over 60 Minutes Intravenous Every 12 hours 08/08/19 0644 08/10/19 0919   08/08/19 0800  levofloxacin (LEVAQUIN) IVPB 750 mg  Status:  Discontinued        750 mg 100 mL/hr over 90  Minutes Intravenous Every 24 hours 08/08/19 0644 08/09/19 0115   08/08/19 0645  vancomycin (VANCOREADY) IVPB 1250 mg/250 mL        1,250 mg 166.7 mL/hr over 90 Minutes Intravenous  Once 08/08/19 3500 08/08/19 0916      Subjective: Patient seen and examined at the bedside this morning.  Hemodynamically stable.  Complains of neck pain as usual.  There was a small bulla on the surgical wound but there is no concern for infection.  Objective: Vitals:   09/02/19 2053 09/02/19 2347 09/03/19 0326 09/03/19 0802  BP: 102/67 106/73 113/77 106/73  Pulse: 92 96 91 90  Resp: 16 16 16 18   Temp: 98.4 F (36.9 C) 98.3 F (36.8 C) 98 F (36.7 C) 98.1 F (36.7 C)  TempSrc: Oral Oral Oral Oral  SpO2: 99% 99% 100% 98%  Weight:      Height:        Intake/Output Summary (Last 24 hours) at 09/03/2019 9381 Last data filed at 09/02/2019 1532 Gross per 24 hour  Intake 103.81 ml  Output --  Net 103.81 ml   Filed Weights   08/07/19 2205 08/08/19 1253 08/08/19 1823  Weight: 59 kg 59 kg 61.2 kg    Examination:    General exam: Appears calm and comfortable ,Not in distress,average built HEENT:PERRL,Oral mucosa moist, Ear/Nose normal on gross exam, surgical scar on the back of the neck with overlying small bulla Respiratory system: Bilateral equal air entry, normal vesicular breath sounds, no wheezes or crackles  Cardiovascular system: S1 & S2 heard, RRR. No JVD, murmurs, rubs, gallops or clicks. Gastrointestinal system: Abdomen is nondistended, soft and nontender. No organomegaly or masses felt. Normal bowel sounds heard. Central nervous system: Alert and oriented. No focal neurological deficits. Extremities: No edema, no clubbing ,no cyanosis, distal peripheral pulses palpable. Skin: No rashes, lesions or ulcers,no icterus ,no pallor  Data Reviewed: I have personally reviewed following labs and imaging studies  CBC: Recent Labs  Lab 08/29/19 1233 08/30/19 0220 09/01/19 0316  WBC 4.9 4.2  4.5  NEUTROABS 2.7 1.9 2.0  HGB 11.0* 10.4* 10.6*  HCT 34.8* 32.9* 33.0*  MCV 92.6 92.4 92.7  PLT 466* 418* 829   Basic Metabolic Panel: Recent Labs  Lab 08/29/19 1233 08/30/19 0220 09/01/19 0316 09/02/19 0617 09/03/19 0313  NA 138 139 137 136 138  K 3.9 3.9 4.2 4.4 4.1  CL 101 101 99 100 101  CO2 28 29 28 29 29   GLUCOSE 109* 136* 111* 107* 112*  BUN <5* <5* 6 <5* <5*  CREATININE 0.45 0.45 0.51 0.52 0.50  CALCIUM 9.5 9.2 9.1 8.9 9.0  MG  --  1.6* 1.6* 1.7  --    GFR: Estimated Creatinine Clearance: 88.5 mL/min (by C-G formula based  on SCr of 0.5 mg/dL). Liver Function Tests: Recent Labs  Lab 08/29/19 1233 08/30/19 0220 09/01/19 0316 09/02/19 0617 09/03/19 0313  AST 315* 265* 341* 390* 421*  ALT 214* 195* 236* 245* 271*  ALKPHOS 192* 191* 230* 245* 281*  BILITOT 0.5 0.4 0.5 0.3 0.5  PROT 7.0 6.4* 6.3* 6.0* 6.4*  ALBUMIN 2.9* 2.7* 2.7* 2.6* 2.7*   No results for input(s): LIPASE, AMYLASE in the last 168 hours. No results for input(s): AMMONIA in the last 168 hours. Coagulation Profile: Recent Labs  Lab 08/29/19 1233  INR 1.0   Cardiac Enzymes: No results for input(s): CKTOTAL, CKMB, CKMBINDEX, TROPONINI in the last 168 hours. BNP (last 3 results) No results for input(s): PROBNP in the last 8760 hours. HbA1C: No results for input(s): HGBA1C in the last 72 hours. CBG: No results for input(s): GLUCAP in the last 168 hours. Lipid Profile: No results for input(s): CHOL, HDL, LDLCALC, TRIG, CHOLHDL, LDLDIRECT in the last 72 hours. Thyroid Function Tests: No results for input(s): TSH, T4TOTAL, FREET4, T3FREE, THYROIDAB in the last 72 hours. Anemia Panel: No results for input(s): VITAMINB12, FOLATE, FERRITIN, TIBC, IRON, RETICCTPCT in the last 72 hours. Sepsis Labs: No results for input(s): PROCALCITON, LATICACIDVEN in the last 168 hours.  No results found for this or any previous visit (from the past 240 hour(s)).       Radiology Studies: No results  found.      Scheduled Meds: . Chlorhexidine Gluconate Cloth  6 each Topical Daily  . enoxaparin (LOVENOX) injection  40 mg Subcutaneous Q24H  . feeding supplement  1 Container Oral TID BM  . folic acid  1 mg Oral Daily  . magnesium oxide  400 mg Oral Daily  . morphine  45 mg Oral Q12H  . multivitamin with minerals  1 tablet Oral Daily  . nicotine  14 mg Transdermal Daily  . senna-docusate  1 tablet Oral BID  . sodium chloride flush  10-40 mL Intracatheter Q12H  . thiamine  100 mg Oral Daily   Continuous Infusions: . sodium chloride 10 mL/hr at 08/24/19 2340  .  ceFAZolin (ANCEF) IV 2 g (09/03/19 0501)     LOS: 26 days    Time spent: 25 mins.More than 50% of that time was spent in counseling and/or coordination of care.      Shelly Coss, MD [facility] P7/28/2021, 8:08 AM

## 2019-09-03 NOTE — Progress Notes (Signed)
Occupational Therapy Treatment Patient Details Name: Monica Eaton MRN: [id number] DOB: 05-01-1975 Today's Date: 09/03/2019    History of present illness Monica Eaton is a 44 y.o. female with medical history significant for tobacco abuse and IV drug abuse who presents to the emergency department due to sudden onset of upper back pain which started about 3 -4 hours PTA.  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 Pt underwent, s/p C3-6 OLaminectomy & fusion. Pt is remaining hospitalized for IV antibiotics.   OT comments  Patient continues to make steady progress towards goals in skilled OT session. Patient's session encompassed theraputty exercises with HEP provided. Pt provided yellow theraputty with good demonstration of exercises (handout left in room for carry over). Pt continues to perseverate on "pins and needles" feeling in her hands and legs, but was receptive to assurance from therapist to continue to work towards goals and increasing dexterity. Discharge remains appropriate at this time; will continue to follow acutely.    Follow Up Recommendations  Home health OT    Equipment Recommendations  Other (comment) (TBD)    Recommendations for Other Services      Precautions / Restrictions Precautions Precautions: Cervical;Fall Precaution Booklet Issued: Yes (comment) Precaution Comments: reviewed precautions verbally Required Braces or Orthoses: Cervical Brace Cervical Brace: Soft collar;For comfort Restrictions Other Position/Activity Restrictions: pt not wearing soft collar today       Mobility Bed Mobility                  Transfers                      Balance                                           ADL either performed or assessed with clinical judgement   ADL Overall ADL's : Needs assistance/impaired                                        General ADL Comments: Session focus on theraputty exercises and HEP     Vision       Perception     Praxis      Cognition Arousal/Alertness: Awake/alert Behavior During Therapy: WFL for tasks assessed/performed Overall Cognitive Status: Within Functional Limits for tasks assessed                                          Exercises Other Exercises Other Exercises: Yellow theraputty provided to paitent with HEP   Shoulder Instructions       General Comments      Pertinent Vitals/ Pain       Pain Assessment: Faces Faces Pain Scale: Hurts little more Pain Location: neck and back Pain Descriptors / Indicators: Aching;Guarding Pain Intervention(s): Monitored during session;Repositioned  Home Living                                          Prior Functioning/Environment              Frequency  Min 2X/week        Progress Toward Goals  OT Goals(current goals can now be found in the care plan section)  Progress towards OT goals: Progressing toward goals  Acute Rehab OT Goals Patient Stated Goal: to get out of here OT Goal Formulation: With patient Time For Goal Achievement: 09/11/19 Potential to Achieve Goals: Good  Plan Discharge plan remains appropriate    Co-evaluation                 AM-PAC OT "6 Clicks" Daily Activity     Outcome Measure   Help from another person eating meals?: None Help from another person taking care of personal grooming?: A Little Help from another person toileting, which includes using toliet, bedpan, or urinal?: A Little Help from another person bathing (including washing, rinsing, drying)?: A Little Help from another person to put on and taking off regular upper body clothing?: A Little Help from another person to put on and taking off regular lower body clothing?: A Lot 6 Click Score: 18    End of Session Equipment Utilized During Treatment: Other (comment) (Theraputty)  OT  Visit Diagnosis: Unsteadiness on feet (R26.81);Pain;Muscle weakness (generalized) (M62.81);Other abnormalities of gait and mobility (R26.89);Other symptoms and signs involving the nervous system (R29.898) Pain - part of body:  (Neck)   Activity Tolerance Patient tolerated treatment well   Patient Left in bed;with call bell/phone within reach   Nurse Communication Mobility status        Time: 0459-9774 OT Time Calculation (min): 16 min  Charges: OT General Charges $OT Visit: 1 Visit OT Treatments $Therapeutic Exercise: 8-22 mins   Corinne Ports E. Luzelena Heeg, COTA/L [facility] [place] 09/03/2019, 2:10 PM

## 2019-09-03 NOTE — Procedures (Signed)
Echo attempted. Patient needed to use restroom. Waited 15 min before leaving. Will attempt again later.

## 2019-09-03 NOTE — Plan of Care (Signed)

## 2019-09-04 ENCOUNTER — Inpatient Hospital Stay (HOSPITAL_COMMUNITY): Payer: Medicaid Other

## 2019-09-04 DIAGNOSIS — R7989 Other specified abnormal findings of blood chemistry: Secondary | ICD-10-CM | POA: Diagnosis not present

## 2019-09-04 DIAGNOSIS — I339 Acute and subacute endocarditis, unspecified: Secondary | ICD-10-CM | POA: Diagnosis not present

## 2019-09-04 DIAGNOSIS — K759 Inflammatory liver disease, unspecified: Secondary | ICD-10-CM

## 2019-09-04 DIAGNOSIS — M4642 Discitis, unspecified, cervical region: Secondary | ICD-10-CM | POA: Diagnosis not present

## 2019-09-04 DIAGNOSIS — Z720 Tobacco use: Secondary | ICD-10-CM | POA: Diagnosis not present

## 2019-09-04 DIAGNOSIS — B182 Chronic viral hepatitis C: Secondary | ICD-10-CM

## 2019-09-04 LAB — HEPATIC FUNCTION PANEL
ALT: 305 U/L — ABNORMAL HIGH (ref 0–44)
AST: 451 U/L — ABNORMAL HIGH (ref 15–41)
Albumin: 2.5 g/dL — ABNORMAL LOW (ref 3.5–5.0)
Alkaline Phosphatase: 283 U/L — ABNORMAL HIGH (ref 38–126)
Bilirubin, Direct: 0.5 mg/dL — ABNORMAL HIGH (ref 0.0–0.2)
Indirect Bilirubin: 0.1 mg/dL — ABNORMAL LOW (ref 0.3–0.9)
Total Bilirubin: 0.6 mg/dL (ref 0.3–1.2)
Total Protein: 5.9 g/dL — ABNORMAL LOW (ref 6.5–8.1)

## 2019-09-04 LAB — HIV-1 RNA QUANT-NO REFLEX-BLD
HIV 1 RNA Quant: <20 copies/mL
LOG10 HIV-1 RNA: UNDETERMINED log10copy/mL

## 2019-09-04 LAB — ECHOCARDIOGRAM COMPLETE
Area-P 1/2: 5.84 cm2
Calc EF: 56.8 %
Height: 67 in
S' Lateral: 3.2 cm
Single Plane A2C EF: 58 %
Single Plane A4C EF: 56.3 %
Weight: 2158.74 oz

## 2019-09-04 NOTE — Consult Note (Signed)
Referring Provider:  Dr. Tawanna Solo, [facility] Primary Care Physician:  Patient, No Pcp Per Primary Gastroenterologist:  Althia Forts  Reason for Consultation:  Elevated LFTs  HPI: Monica Eaton is a 44 y.o. female with history of IV drug abuse who presented to [name] hospital with severe neck pain and upper back pain.  In the emergency department she was found to have leukocytosis, elevated CRP.  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  Neurosurgery was consulted and she has required surgical intervention on 08/08/2019.  She was found to have MSSA bacteremia and started on IV antibiotics.  GI is being consulted for elevated LFTs.  LFTs were normal at the time of admission and were first noted to be elevated on  7/15 with AST 91 and ALT 80.  Have continued to increase to AST 421, ALT 271, and ALP now elevated at 281.  Total bili normal.  Ultrasound normal.  She had been on rifampin but that was discontinued one week ago and LFTs still going up.  Has been on cefazolin almost the entire hospitalization minus a couple of days at the beginning of hospitalization.  That was discontinued yesterday and she was started on doxycycline.    Viral hepatitis studies are negative.   Past Medical History:  Diagnosis Date  . Alcohol abuse   . IV drug user    heroin  . Neck pain, chronic   . Neuropathy     Past Surgical History:  Procedure Laterality Date  . BREAST ENHANCEMENT SURGERY    . CESAREAN SECTION    . FINGER FRACTURE SURGERY Left    left ring finger fracture.  Marland Kitchen RADIOLOGY WITH ANESTHESIA N/A 08/08/2019   Procedure: MRI WITH ANESTHESIA;  Surgeon: Radiologist, Medication, MD;  Location: [place];  Service: Radiology;  Laterality: N/A;  . WISDOM TOOTH EXTRACTION      Prior to Admission medications   Medication Sig Start Date End Date Taking? Authorizing Provider  acetaminophen (TYLENOL) 500 MG tablet Take 2,000  mg by mouth every 6 (six) hours as needed for mild pain.   Yes [provider]    Current Facility-Administered Medications  Medication Dose Route Frequency Provider Last Rate Last Admin  . 0.9 %  sodium chloride infusion   Intravenous PRN Cherene Altes, MD 10 mL/hr at 08/24/19 2340 Rate Verify at 08/24/19 2340  . bisacodyl (DULCOLAX) suppository 10 mg  10 mg Rectal Daily PRN Aline August, MD      . Chlorhexidine Gluconate Cloth 2 % PADS 6 each  6 each Topical Daily [name], MD   6 each at 09/03/19 0902  . cyclobenzaprine (FLEXERIL) tablet 5-10 mg  5-10 mg Oral TID PRN Cherene Altes, MD   10 mg at 09/03/19 2214  . doxycycline (VIBRA-TABS) tablet 100 mg  100 mg Oral Q12H Tommy Medal, Lavell Islam, MD   100 mg at 09/04/19 0912  . enoxaparin (LOVENOX) injection 40 mg  40 mg Subcutaneous Q24H Adefeso, Olad[place], DO   40 mg at 09/03/19 1748  . feeding supplement (BOOST / RESOURCE BREEZE) liquid 1 Container  1 Container Oral TID BM Damita Lack, MD   1 Container at 09/01/19 [phone]  . folic acid (FOLVITE) tablet 1 mg  1 mg Oral Daily Shela Leff, MD   1 mg at 09/04/19 0912  . HYDROmorphone (DILAUDID) injection 0.5 mg  0.5 mg Intravenous Q3H PRN Shelly Coss, MD  0.5 mg at 09/04/19 1105  . magnesium oxide (MAG-OX) tablet 400 mg  400 mg Oral Daily Shelly Coss, MD   400 mg at 09/04/19 0912  . morphine (MS CONTIN) 12 hr tablet 45 mg  45 mg Oral Q12H Cherene Altes, MD   45 mg at 09/04/19 0960  . multivitamin with minerals tablet 1 tablet  1 tablet Oral Daily Shela Leff, MD   1 tablet at 09/04/19 0912  . nicotine (NICODERM CQ - dosed in mg/24 hours) patch 14 mg  14 mg Transdermal Daily Lovey Newcomer T, NP   14 mg at 09/04/19 0920  . oxyCODONE (Oxy IR/ROXICODONE) immediate release tablet 10 mg  10 mg Oral Q4H PRN Amin, Jeanella Flattery, MD   10 mg at 09/04/19 0912  . polyethylene glycol (MIRALAX / GLYCOLAX) packet 17 g  17 g Oral Daily PRN Aline August, MD       . senna-docusate (Senokot-S) tablet 1 tablet  1 tablet Oral BID Aline August, MD   1 tablet at 09/04/19 0912  . sodium chloride flush (NS) 0.9 % injection 10-40 mL  10-40 mL Intracatheter Q12H Amin, Ankit Chirag, MD   10 mL at 09/04/19 1134  . sodium chloride flush (NS) 0.9 % injection 10-40 mL  10-40 mL Intracatheter PRN Amin, Jeanella Flattery, MD   10 mL at 08/23/19 2205  . thiamine tablet 100 mg  100 mg Oral Daily Shela Leff, MD   100 mg at 09/04/19 0912  . traZODone (DESYREL) tablet 100 mg  100 mg Oral QHS PRN Damita Lack, MD   100 mg at 09/03/19 2214    Allergies as of 08/07/2019 - Review Complete 08/07/2019  Allergen Reaction Noted  . Cefaclor  07/15/2012  . Cephalosporins  08/07/2019  . Nitrofurantoin  07/12/2018  . Penicillins  07/15/2012    Family History  Problem Relation Age of Onset  . Parkinson's disease Mother   . Arthritis Father     Social History   Socioeconomic History  . Marital status: Married    Spouse name: Not on file  . Number of children: Not on file  . Years of education: Not on file  . Highest education level: Not on file  Occupational History  . Not on file  Tobacco Use  . Smoking status: Never Smoker  . Smokeless tobacco: Never Used  Vaping Use  . Vaping Use: Never used  Substance and Sexual Activity  . Alcohol use: Yes  . Drug use: Not Currently  . Sexual activity: Not on file  Other Topics Concern  . Not on file  Social History Narrative  . Not on file   Social Determinants of Health   Financial Resource Strain:   . Difficulty of Paying Living Expenses:   Food Insecurity:   . Worried About Charity fundraiser in the Last Year:   . Arboriculturist in the Last Year:   Transportation Needs:   . Film/video editor (Medical):   Marland Kitchen Lack of Transportation (Non-Medical):   Physical Activity:   . Days of Exercise per Week:   . Minutes of Exercise per Session:   Stress:   . Feeling of Stress :   Social Connections:    . Frequency of Communication with Friends and Family:   . Frequency of Social Gatherings with Friends and Family:   . Attends Religious Services:   . Active Member of Clubs or Organizations:   . Attends Archivist Meetings:   .  Marital Status:   Intimate Partner Violence:   . Fear of Current or Ex-Partner:   . Emotionally Abused:   Marland Kitchen Physically Abused:   . Sexually Abused:    Review of Systems: ROS is O/W negative except as mentioned in HPI.  Physical Exam: Vital signs in last 24 hours: Temp:  [98 F (36.7 C)-98.4 F (36.9 C)] (P) 98.4 F (36.9 C) (07/29 1146) Pulse Rate:  [85-97] (P) 94 (07/29 1146) Resp:  [16-18] (P) 18 (07/29 1146) BP: (112-137)/(80-90) (P) 122/79 (07/29 1146) SpO2:  [98 %-100 %] (P) 99 % (07/29 1146) Last BM Date: 09/03/19 General:  Alert, Well-developed, well-nourished, pleasant and cooperative in NAD Head:  Normocephalic and atraumatic. Eyes:  Sclera clear, no icterus.  Conjunctiva pink. Ears:  Normal auditory acuity. Mouth:  No deformity or lesions.   Lungs:  Clear throughout to auscultation.  No wheezes, crackles, or rhonchi.  Heart:  Regular rate and rhythm; no murmurs, clicks, rubs, or gallops. Abdomen:  Soft, non-distended.  BS present.  Non-tender.  Msk:  Symmetrical without gross deformities. Pulses:  Normal pulses noted. Extremities:  Without clubbing or edema. Neurologic:  Alert and oriented x 4;  grossly normal neurologically. Skin:  Intact without significant lesions or rashes. Psych:  Alert and cooperative. Normal mood and affect.  Intake/Output from previous day: 07/28 0701 - 07/29 0700 In: 1524.3 [I.V.:1224.3; IV Piggyback:300] Out: 700 [Urine:700]  BMET Recent Labs    09/02/19 0617 09/03/19 0313  NA 136 138  K 4.4 4.1  CL 100 101  CO2 29 29  GLUCOSE 107* 112*  BUN <5* <5*  CREATININE 0.52 0.50  CALCIUM 8.9 9.0   LFT Recent Labs    09/04/19 0924  PROT 5.9*  ALBUMIN 2.5*  AST 451*  ALT 305*  ALKPHOS  283*  BILITOT 0.6  BILIDIR 0.5*  IBILI 0.1*   Hepatitis Panel Recent Labs    09/03/19 1506  HEPBSAG NON REACTIVE  HEPAIGM NON REACTIVE   IMPRESSION:  *Elevated LFTs:  Normal at admission and first noted to be elevated on 7/15 with AST 91 and ALT 80.  Have continued to increase to AST 421, ALT 271, and ALP now elevated at 281.  Total bili normal.  Ultrasound normal.  This has to be medication related/DILI as her LFTs were normal on admission.  I reviewed her list extensively and the only other medications that could be causing this is the Cefazolin and Lovenox has also been described to cause hepatotoxicity although not common.  Cefazolin was discontinued on 7/28 and switched to doxycycline.  Viral hepatitis studies negative.  PLAN: -Would continue to trend LFTs now that cefazolin has been discontinued.  If they continue to remain elevated or trend upwards then would consider doing full liver serologies to be sure something else has not become uncovered and possible liver biopsy to rule out drug induced vs other.   Laban Emperor. Avangelina Flight  09/04/2019, 1:51 PM

## 2019-09-04 NOTE — TOC Progression Note (Addendum)
Transition of Care [facility]) - Progression Note    Patient Details  Name: Monica Eaton MRN: [id number] Date of Birth: 1975/03/22  Transition of Care [facility]) CM/SW Contact  Pollie Friar, RN Phone Number: 09/04/2019, 1:47 PM  Clinical Narrative:    CM consulted for drug rehab, methadone/ suboxone clinic post hospital, PCP. Pt is not interested in Drug Rehab. She states she has been before and she will not go again. She also states she is not interested in suboxone/ methadone clinics but if she changes her mind she will let CM know. CM will arrange her a PCP closer to d/c.  Pt states her plan at d/c is to return to her parents home.  TOC following for further d/c needs.         Expected Discharge Plan and Services                                                 Social Determinants of Health (SDOH) Interventions    Readmission Risk Interventions No flowsheet data found.

## 2019-09-04 NOTE — Progress Notes (Signed)
PROGRESS NOTE    Monica Eaton  YTK:[id number] DOB: [birth year] DOA: 08/07/2019 PCP: Patient, No Pcp Per   Brief Narrative:  Patient is a 44 year old female with history of IV drug abuse, tobacco abuse who presents with severe neck pain and upper back pain.  In the emergency department she was found to have leukocytosis, elevated CRP.  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  Neurosurgery was consulted and she has required surgical intervention on 08/08/2019.  She was found to have MSSA bacteremia and started on IV antibiotics.  Antibiotics changed to doxycycline due   to progressive elevated liver function test.   Assessment & Plan:   Principal Problem:   Discitis of cervical region Active Problems:   Cervical pain (neck)   IVDU (intravenous drug user)   Tobacco abuse   Neck pain   Bandemia   MSSA bacteremia   Epidural abscess   Hardware complicating wound infection ([place])   Malnutrition of moderate degree   LFT elevation   Chronic hepatitis C with hepatic coma (HCC)   Hepatitis   MSSA cervical discitis/osteomyelitis: Status post C3-7 laminectomy , C3-6 PSIF.  Neurosurgery was following.  She will follow-up with neurosurgery as an outpatient.  MSSA bacteremia: She was on cefazolin.  Plan was to continue 4 to 5 weeks of IV cefazolin as an inpatient and switch to oral therapy.  Rifampin has been stopped due to elevated liver enzymes.  2D echo did not show any vegetations.  She needs long-term oral antibiotics given placement of hardware.  ID recalled earlier because of progressive worsening of liver function.  Stopped cefazolin and started on doxycycline.  Rechecking echo  IV drug use: Counseled for cessation.  HIV, hepatitis B and hepatitis C negative.  UDS was positive for benzodiazepines, opiates, THC.  Patient is also a smoker.  Continue nicotine patch.  Elevated LFTs: Trending up.Liver  ultrasound unremarkable.  Suspected to be from rifampin but it was stopped days ago.  Continue to monitor LFTs.  Hepatitis panel negative twice.Check daily LFTs.  She does not have any right upper quadrant tenderness or hepatomegaly.Will get GI on board  Neck pain/back pain: On MS Contin, Flexeril.  Continue supportive care  Normocytic anemia: Currently hemoglobin stable.  Hypomagnesemia: Supplemented  Nutrition Problem: Moderate Malnutrition Etiology: social / environmental circumstances      DVT prophylaxis: Lovenox Code Status: Full code Family Communication: Discussed with father on phone on 09/03/2019 Status is: Inpatient  Remains inpatient appropriate because:IV treatments appropriate due to intensity of illness or inability to take PO   Dispo: The patient is from: Home              Anticipated d/c is to: Home              Anticipated d/c date is: > 3 days              Patient currently is not medically stable to d/c.      Consultants: Neuro surgery, ID  Procedures: Cervical laminectomy and fusion  Antimicrobials:  Anti-infectives (From admission, onward)   Start     Dose/Rate Route Frequency Ordered Stop   09/03/19 2200  doxycycline (VIBRA-TABS) tablet 100 mg     Discontinue     100 mg Oral Every 12 hours 09/03/19 1445     08/12/19 1700  rifampin (RIFADIN) capsule 300 mg  Status:  Discontinued  300 mg Oral 2 times daily with meals 08/12/19 1353 08/28/19 1415   08/11/19 1400  ceFAZolin (ANCEF) IVPB 2g/100 mL premix  Status:  Discontinued        2 g 200 mL/hr over 30 Minutes Intravenous Every 8 hours 08/11/19 1159 09/03/19 1445   08/10/19 1400  vancomycin (VANCOREADY) IVPB 750 mg/150 mL  Status:  Discontinued        750 mg 150 mL/hr over 60 Minutes Intravenous Every 8 hours 08/10/19 1254 08/11/19 1159   08/08/19 2042  bacitracin 50,000 Units in sodium chloride 0.9 % 500 mL irrigation  Status:  Discontinued          As needed 08/08/19 2043 08/08/19 2350    08/08/19 1800  vancomycin (VANCOCIN) IVPB 1000 mg/200 mL premix  Status:  Discontinued        1,000 mg 200 mL/hr over 60 Minutes Intravenous Every 12 hours 08/08/19 0644 08/10/19 0919   08/08/19 0800  levofloxacin (LEVAQUIN) IVPB 750 mg  Status:  Discontinued        750 mg 100 mL/hr over 90 Minutes Intravenous Every 24 hours 08/08/19 0644 08/09/19 0115   08/08/19 0645  vancomycin (VANCOREADY) IVPB 1250 mg/250 mL        1,250 mg 166.7 mL/hr over 90 Minutes Intravenous  Once 08/08/19 7681 08/08/19 0916      Subjective: Patient seen and examined at the bedside this morning.  Hemodynamically stable.  No complains today  Objective: Vitals:   09/04/19 0007 09/04/19 0333 09/04/19 0802 09/04/19 1146  BP: (!) 127/88 (!) 124/88 (!) 137/90 (P) 122/79  Pulse: 94 85 92 (P) 94  Resp: 16 16 18  (P) 18  Temp: 98 F (36.7 C) 98 F (36.7 C) 98 F (36.7 C) (P) 98.4 F (36.9 C)  TempSrc: Oral Oral Oral (P) Oral  SpO2: 98% 98% 99% (P) 99%  Weight:      Height:        Intake/Output Summary (Last 24 hours) at 09/04/2019 1333 Last data filed at 09/03/2019 2323 Gross per 24 hour  Intake 1524.31 ml  Output 700 ml  Net 824.31 ml   Filed Weights   08/07/19 2205 08/08/19 1253 08/08/19 1823  Weight: 59 kg 59 kg 61.2 kg    Examination:    General exam: Appears calm and comfortable ,Not in distress  She was walking in the room when I entered.  Did not do full examination   data Reviewed: I have personally reviewed following labs and imaging studies  CBC: Recent Labs  Lab 08/29/19 1233 08/30/19 0220 09/01/19 0316  WBC 4.9 4.2 4.5  NEUTROABS 2.7 1.9 2.0  HGB 11.0* 10.4* 10.6*  HCT 34.8* 32.9* 33.0*  MCV 92.6 92.4 92.7  PLT 466* 418* 157   Basic Metabolic Panel: Recent Labs  Lab 08/29/19 1233 08/30/19 0220 09/01/19 0316 09/02/19 0617 09/03/19 0313  NA 138 139 137 136 138  K 3.9 3.9 4.2 4.4 4.1  CL 101 101 99 100 101  CO2 28 29 28 29 29   GLUCOSE 109* 136* 111* 107* 112*  BUN  <5* <5* 6 <5* <5*  CREATININE 0.45 0.45 0.51 0.52 0.50  CALCIUM 9.5 9.2 9.1 8.9 9.0  MG  --  1.6* 1.6* 1.7  --    GFR: Estimated Creatinine Clearance: 88.5 mL/min (by C-G formula based on SCr of 0.5 mg/dL). Liver Function Tests: Recent Labs  Lab 08/30/19 0220 09/01/19 0316 09/02/19 0617 09/03/19 0313 09/04/19 0924  AST 265* 341* 390* 421* 451*  ALT 195* 236* 245* 271* 305*  ALKPHOS 191* 230* 245* 281* 283*  BILITOT 0.4 0.5 0.3 0.5 0.6  PROT 6.4* 6.3* 6.0* 6.4* 5.9*  ALBUMIN 2.7* 2.7* 2.6* 2.7* 2.5*   No results for input(s): LIPASE, AMYLASE in the last 168 hours. No results for input(s): AMMONIA in the last 168 hours. Coagulation Profile: Recent Labs  Lab 08/29/19 1233  INR 1.0   Cardiac Enzymes: No results for input(s): CKTOTAL, CKMB, CKMBINDEX, TROPONINI in the last 168 hours. BNP (last 3 results) No results for input(s): PROBNP in the last 8760 hours. HbA1C: No results for input(s): HGBA1C in the last 72 hours. CBG: No results for input(s): GLUCAP in the last 168 hours. Lipid Profile: No results for input(s): CHOL, HDL, LDLCALC, TRIG, CHOLHDL, LDLDIRECT in the last 72 hours. Thyroid Function Tests: No results for input(s): TSH, T4TOTAL, FREET4, T3FREE, THYROIDAB in the last 72 hours. Anemia Panel: No results for input(s): VITAMINB12, FOLATE, FERRITIN, TIBC, IRON, RETICCTPCT in the last 72 hours. Sepsis Labs: No results for input(s): PROCALCITON, LATICACIDVEN in the last 168 hours.  No results found for this or any previous visit (from the past 240 hour(s)).       Radiology Studies: No results found.      Scheduled Meds: . Chlorhexidine Gluconate Cloth  6 each Topical Daily  . doxycycline  100 mg Oral Q12H  . enoxaparin (LOVENOX) injection  40 mg Subcutaneous Q24H  . feeding supplement  1 Container Oral TID BM  . folic acid  1 mg Oral Daily  . magnesium oxide  400 mg Oral Daily  . morphine  45 mg Oral Q12H  . multivitamin with minerals  1 tablet  Oral Daily  . nicotine  14 mg Transdermal Daily  . senna-docusate  1 tablet Oral BID  . sodium chloride flush  10-40 mL Intracatheter Q12H  . thiamine  100 mg Oral Daily   Continuous Infusions: . sodium chloride 10 mL/hr at 08/24/19 2340  . sodium chloride 100 mL/hr at 09/03/19 2323     LOS: 27 days    Time spent: 25 mins.More than 50% of that time was spent in counseling and/or coordination of care.      Shelly Coss, MD [facility] P7/29/2021, 1:33 PM

## 2019-09-04 NOTE — Progress Notes (Signed)
  Echocardiogram 2D Echocardiogram has been performed.  Monica Eaton 09/04/2019, 3:41 PM

## 2019-09-04 NOTE — Progress Notes (Signed)
Subjective:  Patient is quite worried about her liver  Antibiotics:  Anti-infectives (From admission, onward)   Start     Dose/Rate Route Frequency Ordered Stop   09/03/19 2200  doxycycline (VIBRA-TABS) tablet 100 mg     Discontinue     100 mg Oral Every 12 hours 09/03/19 1445     08/12/19 1700  rifampin (RIFADIN) capsule 300 mg  Status:  Discontinued        300 mg Oral 2 times daily with meals 08/12/19 1353 08/28/19 1415   08/11/19 1400  ceFAZolin (ANCEF) IVPB 2g/100 mL premix  Status:  Discontinued        2 g 200 mL/hr over 30 Minutes Intravenous Every 8 hours 08/11/19 1159 09/03/19 1445   08/10/19 1400  vancomycin (VANCOREADY) IVPB 750 mg/150 mL  Status:  Discontinued        750 mg 150 mL/hr over 60 Minutes Intravenous Every 8 hours 08/10/19 1254 08/11/19 1159   08/08/19 2042  bacitracin 50,000 Units in sodium chloride 0.9 % 500 mL irrigation  Status:  Discontinued          As needed 08/08/19 2043 08/08/19 2350   08/08/19 1800  vancomycin (VANCOCIN) IVPB 1000 mg/200 mL premix  Status:  Discontinued        1,000 mg 200 mL/hr over 60 Minutes Intravenous Every 12 hours 08/08/19 0644 08/10/19 0919   08/08/19 0800  levofloxacin (LEVAQUIN) IVPB 750 mg  Status:  Discontinued        750 mg 100 mL/hr over 90 Minutes Intravenous Every 24 hours 08/08/19 0644 08/09/19 0115   08/08/19 0645  vancomycin (VANCOREADY) IVPB 1250 mg/250 mL        1,250 mg 166.7 mL/hr over 90 Minutes Intravenous  Once 08/08/19 3016 08/08/19 0916      Medications: Scheduled Meds: . Chlorhexidine Gluconate Cloth  6 each Topical Daily  . doxycycline  100 mg Oral Q12H  . enoxaparin (LOVENOX) injection  40 mg Subcutaneous Q24H  . feeding supplement  1 Container Oral TID BM  . folic acid  1 mg Oral Daily  . magnesium oxide  400 mg Oral Daily  . morphine  45 mg Oral Q12H  . multivitamin with minerals  1 tablet Oral Daily  . nicotine  14 mg Transdermal Daily  . senna-docusate  1 tablet Oral BID  .  sodium chloride flush  10-40 mL Intracatheter Q12H  . thiamine  100 mg Oral Daily   Continuous Infusions: . sodium chloride 10 mL/hr at 08/24/19 2340   PRN Meds:.sodium chloride, bisacodyl, cyclobenzaprine, HYDROmorphone (DILAUDID) injection, oxyCODONE, polyethylene glycol, sodium chloride flush, traZODone    Objective: Weight change:   Intake/Output Summary (Last 24 hours) at 09/04/2019 1356 Last data filed at 09/03/2019 2323 Gross per 24 hour  Intake 1524.31 ml  Output 700 ml  Net 824.31 ml   Blood pressure (P) 122/79, pulse (P) 94, temperature (P) 98.4 F (36.9 C), temperature source (P) Oral, resp. rate (P) 18, height 5\' 7"  (1.702 m), weight 61.2 kg, SpO2 (P) 99 %. Temp:  [98 F (36.7 C)-98.4 F (36.9 C)] (P) 98.4 F (36.9 C) (07/29 1146) Pulse Rate:  [85-97] (P) 94 (07/29 1146) Resp:  [16-18] (P) 18 (07/29 1146) BP: (112-137)/(80-90) (P) 122/79 (07/29 1146) SpO2:  [98 %-100 %] (P) 99 % (07/29 1146)  Physical Exam: General: Alert and awake, oriented x3,  HEENT: anicteric sclera, EOMI CVS regular rate,  Chest: , no wheezing, no respiratory  distress clear to auscultation bilaterally Abdomen: soft non-distended, no tenderness Extremities: no edema or deformity noted bilaterally Skin: no rashes Neuro: nonfocal  CBC:    BMET Recent Labs    09/02/19 0617 09/03/19 0313  NA 136 138  K 4.4 4.1  CL 100 101  CO2 29 29  GLUCOSE 107* 112*  BUN <5* <5*  CREATININE 0.52 0.50  CALCIUM 8.9 9.0     Liver Panel  Recent Labs    09/03/19 0313 09/04/19 0924  PROT 6.4* 5.9*  ALBUMIN 2.7* 2.5*  AST 421* 451*  ALT 271* 305*  ALKPHOS 281* 283*  BILITOT 0.5 0.6  BILIDIR  --  0.5*  IBILI  --  0.1*       Sedimentation Rate No results for input(s): ESRSEDRATE in the last 72 hours. C-Reactive Protein No results for input(s): CRP in the last 72 hours.  Micro Results: Recent Results (from the past 720 hour(s))  Culture, blood (Routine X 2) w Reflex to ID Panel      Status: Abnormal   Collection Time: 08/08/19  7:27 AM   Specimen: BLOOD RIGHT FOREARM  Result Value Ref Range Status   Specimen Description BLOOD RIGHT FOREARM  Final   Special Requests   Final    BOTTLES DRAWN AEROBIC AND ANAEROBIC Blood Culture results may not be optimal due to an inadequate volume of blood received in culture bottles   Culture  Setup Time   Final    IN BOTH AEROBIC AND ANAEROBIC BOTTLES GRAM POSITIVE COCCI IN CLUSTERS CRITICAL RESULT CALLED TO, READ BACK BY AND VERIFIED WITH: Karsten Ro [facility] 08/09/19 0102 JDW Performed at [facility], [address] [place], [place] [address]    Culture STAPHYLOCOCCUS AUREUS (A)  Final   Report Status 08/10/2019 FINAL  Final   Organism ID, Bacteria STAPHYLOCOCCUS AUREUS  Final      Susceptibility   Staphylococcus aureus - MIC*    CIPROFLOXACIN <=0.5 SENSITIVE Sensitive     ERYTHROMYCIN >=8 RESISTANT Resistant     GENTAMICIN <=0.5 SENSITIVE Sensitive     OXACILLIN 0.5 SENSITIVE Sensitive     TETRACYCLINE <=1 SENSITIVE Sensitive     VANCOMYCIN <=0.5 SENSITIVE Sensitive     TRIMETH/SULFA <=10 SENSITIVE Sensitive     CLINDAMYCIN <=0.25 SENSITIVE Sensitive     RIFAMPIN <=0.5 SENSITIVE Sensitive     Inducible Clindamycin NEGATIVE Sensitive     * STAPHYLOCOCCUS AUREUS  Blood Culture ID Panel (Reflexed)     Status: Abnormal   Collection Time: 08/08/19  7:27 AM  Result Value Ref Range Status   Enterococcus species NOT DETECTED NOT DETECTED Final   Listeria monocytogenes NOT DETECTED NOT DETECTED Final   Staphylococcus species DETECTED (A) NOT DETECTED Final    Comment: CRITICAL RESULT CALLED TO, READ BACK BY AND VERIFIED WITH: J LEDFORD PHARMD 08/09/19 0102 JDW    Staphylococcus aureus (BCID) DETECTED (A) NOT DETECTED Final    Comment: Methicillin (oxacillin) susceptible Staphylococcus aureus (MSSA). Preferred therapy is anti staphylococcal beta lactam antibiotic (Cefazolin or Nafcillin), unless clinically  contraindicated. CRITICAL RESULT CALLED TO, READ BACK BY AND VERIFIED WITH: J LEDFORD [facility] 08/09/19 0102 JDW    Methicillin resistance NOT DETECTED NOT DETECTED Final   Streptococcus species NOT DETECTED NOT DETECTED Final   Streptococcus agalactiae NOT DETECTED NOT DETECTED Final   Streptococcus pneumoniae NOT DETECTED NOT DETECTED Final   Streptococcus pyogenes NOT DETECTED NOT DETECTED Final   Acinetobacter baumannii NOT DETECTED NOT DETECTED Final   Enterobacteriaceae  species NOT DETECTED NOT DETECTED Final   Enterobacter cloacae complex NOT DETECTED NOT DETECTED Final   Escherichia coli NOT DETECTED NOT DETECTED Final   Klebsiella oxytoca NOT DETECTED NOT DETECTED Final   Klebsiella pneumoniae NOT DETECTED NOT DETECTED Final   Proteus species NOT DETECTED NOT DETECTED Final   Serratia marcescens NOT DETECTED NOT DETECTED Final   Haemophilus influenzae NOT DETECTED NOT DETECTED Final   Neisseria meningitidis NOT DETECTED NOT DETECTED Final   Pseudomonas aeruginosa NOT DETECTED NOT DETECTED Final   Candida albicans NOT DETECTED NOT DETECTED Final   Candida glabrata NOT DETECTED NOT DETECTED Final   Candida krusei NOT DETECTED NOT DETECTED Final   Candida parapsilosis NOT DETECTED NOT DETECTED Final   Candida tropicalis NOT DETECTED NOT DETECTED Final    Comment: Performed at [facility], [place] [address], [place], [place] [address]  SARS Coronavirus 2 by RT PCR (hospital order, performed in [facility] hospital lab) Nasopharyngeal Nasopharyngeal Swab     Status: None   Collection Time: 08/08/19  9:02 AM   Specimen: Nasopharyngeal Swab  Result Value Ref Range Status   SARS Coronavirus 2 NEGATIVE NEGATIVE Final    Comment: (NOTE) SARS-CoV-2 target nucleic acids are NOT DETECTED.  The SARS-CoV-2 RNA is generally detectable in upper and lower respiratory specimens during the acute phase of infection. The lowest concentration of SARS-CoV-2 viral copies this assay can detect is  250 copies / mL. A negative result does not preclude SARS-CoV-2 infection and should not be used as the sole basis for treatment or other patient management decisions.  A negative result may occur with improper specimen collection / handling, submission of specimen other than nasopharyngeal swab, presence of viral mutation(s) within the areas targeted by this assay, and inadequate number of viral copies (<250 copies / mL). A negative result must be combined with clinical observations, patient history, and epidemiological information.  Fact Sheet for Patients:   StrictlyIdeas.no  Fact Sheet for Healthcare Providers: BankingDealers.co.za  This test is not yet approved or  cleared by the [place] FDA and has been authorized for detection and/or diagnosis of SARS-CoV-2 by FDA under an Emergency Use Authorization (EUA).  This EUA will remain in effect (meaning this test can be used) for the duration of the COVID-19 declaration under Section 564(b)(1) of the Act, 21 U.S.C. section 360bbb-3(b)(1), unless the authorization is terminated or revoked sooner.  Performed at [facility], [place] [address] [place], [place] [address]   Culture, blood (Routine X 2) w Reflex to ID Panel     Status: Abnormal   Collection Time: 08/08/19  5:11 PM   Specimen: BLOOD RIGHT HAND  Result Value Ref Range Status   Specimen Description BLOOD RIGHT HAND  Final   Special Requests   Final    BOTTLES DRAWN AEROBIC AND ANAEROBIC Blood Culture adequate volume   Culture  Setup Time   Final    GRAM POSITIVE COCCI IN CLUSTERS IN BOTH AEROBIC AND ANAEROBIC BOTTLES CRITICAL RESULT CALLED TO, READ BACK BY AND VERIFIED WITH: J. LEDFORD,PHARMD 0503 08/09/2019 T. TYSOR    Culture (A)  Final    STAPHYLOCOCCUS AUREUS SUSCEPTIBILITIES PERFORMED ON PREVIOUS CULTURE WITHIN THE LAST 5 DAYS. Performed at [facility], [place] [address], [place], [place] [address]     Report Status 08/11/2019 FINAL  Final  Culture, blood (routine x 2)     Status: None   Collection Time: 08/10/19  7:04 AM   Specimen:  BLOOD  Result Value Ref Range Status   Specimen Description BLOOD RIGHT ANTECUBITAL  Final   Special Requests   Final    BOTTLES DRAWN AEROBIC ONLY Blood Culture results may not be optimal due to an inadequate volume of blood received in culture bottles   Culture   Final    NO GROWTH 5 DAYS Performed at [facility] [address], [place], [place] [address]    Report Status 08/15/2019 FINAL  Final  Culture, blood (routine x 2)     Status: None   Collection Time: 08/10/19  7:04 AM   Specimen: BLOOD RIGHT HAND  Result Value Ref Range Status   Specimen Description BLOOD RIGHT HAND  Final   Special Requests   Final    BOTTLES DRAWN AEROBIC ONLY Blood Culture adequate volume   Culture   Final    NO GROWTH 5 DAYS Performed at [facility], [place] [address], [place], [place] [address]    Report Status 08/15/2019 FINAL  Final    Studies/Results: No results found.    Assessment/Plan:  INTERVAL HISTORY:   LFTs not improved 2D echocardiogram was not yet done  Principal Problem:   Discitis of cervical region Active Problems:   Cervical pain (neck)   IVDU (intravenous drug user)   Tobacco abuse   Neck pain   Bandemia   MSSA bacteremia   Epidural abscess   Hardware complicating wound infection ([place])   Malnutrition of moderate degree   LFT elevation   Chronic hepatitis C with hepatic coma (HCC)   Hepatitis    Monica Eaton is a 44 y.o. female with MSSA bacteremia with cervical spine discitis and epidural abscess status post neurosurgery with placement of hardware  #1 MSSA bacteremia with C-spine infection and epidural abscess:   We had planned on 4-5 weeks of IV cefazolin and rifampin in-house, and then if she is continue to improve consider switch to oral therapy   Rifampin was stopped due to concerns that it was the  culprit for hepatotoxicity.  The idea of cefazolin being a hepatotoxin was raised though it is not typically 1.  That being said given her continuing rise in liver function test will make a change to oral doxycycline which we did yesterday.  #2 hepatitis: I wonder if she is having some right-sided heart failure and that she may be potentially could have endocarditis that is now declaring itself.  We will repeat 2D echocardiogram    Perhaps there is still residual ongoing inflammation due to the rifampin.  As mentioned we will move away from cefazolin at present would stop all hepatotoxic drugs including Tylenol  I redid the talk screen in case she was injecting drugs in the hospital.  It is only showing opiates which I hope are only the opiate she is being prescribed.  She does not seem to be acting like someone who is getting additional drugs.  I rechecked hep B surface antigen and hep C RNA hep A IgM.  I think she would benefit from a GI/hepatology consult   #3 IVDU:   She has had treatment at multiple treatment centers I think 5 in total that he told me.  I will talk to IMT re this   Spoke with Amana's father Broadus John.     LOS: 27 days   Alcide Evener 09/04/2019, 1:56 PM

## 2019-09-04 NOTE — Progress Notes (Signed)
MD requested for RN to setup an appoint with the opiate clinic for pt but pt refused. Pt said she didn't need treatment. CSW, Case manager, charge RN and MD notified. Delia Heady RN

## 2019-09-04 NOTE — CV Procedure (Signed)
2D echo attempted, but patient eating and wants to use restroom before echo. Will try later.

## 2019-09-04 NOTE — Progress Notes (Signed)
Physical Therapy Treatment Patient Details Name: Monica Eaton MRN: [id number] DOB: [birth year] Today's Date: 09/04/2019    History of Present Illness Monica Eaton is a 44 y.o. female with medical history significant for tobacco abuse and IV drug abuse who presents to the emergency department due to sudden onset of upper back pain which started about 3 -4 hours PTA.  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 Pt underwent, s/p C3-6 OLaminectomy & fusion. Pt is remaining hospitalized for IV antibiotics.    PT Comments    Pt demonstrating gradual progress. She demonstrates transfers safely and was able to ambulate in hallway with supervision and RW.  She demonstrates good understanding of cervical precautions.   From PT perspective, pt demonstrates balance and safety awareness to transfer and ambulate to bathroom independently.  Does need supervision for longer distance ambulation.   Pt does continue to demonstrate weaknesses in trunk and extremities that will continue to benefit from PT.  She required verbal and tactile cues for correct exercise form.  Continued to perform gentle core exercises with addition of hooklying exercises with pelvic tilts.    Follow Up Recommendations  Home health PT;Supervision/Assistance - 24 hour     Equipment Recommendations  Rolling walker with 5" wheels    Recommendations for Other Services       Precautions / Restrictions Precautions Precautions: Cervical Precaution Comments: reviewed precautions verbally Cervical Brace: Soft collar;For comfort Restrictions Other Position/Activity Restrictions: pt not wearing soft collar today    Mobility  Bed Mobility Overal bed mobility: Modified Independent Bed Mobility: Rolling;Sidelying to Sit;Sit to Sidelying           General bed mobility comments: no physical assist needed, + use of rail and HOB up  Transfers Overall transfer level:  Needs assistance Equipment used: None Transfers: Sit to/from Stand Sit to Stand: Supervision         General transfer comment: S for safety, good knowledge of precautions  Ambulation/Gait Ambulation/Gait assistance: Supervision Gait Distance (Feet): 200 Feet Assistive device: Rolling walker (2 wheeled) Gait Pattern/deviations: Step-through pattern;Decreased stride length Gait velocity: decreased   General Gait Details: Good use of RW for stability due to pain with increased distance, had c/o L ankle pain/stiffness.  In room, pt ambulated without AD with steady gait.   Stairs             Wheelchair Mobility    Modified Rankin (Stroke Patients Only)       Balance Overall balance assessment: Modified Independent Sitting-balance support: No upper extremity supported Sitting balance-Leahy Scale: Good     Standing balance support: During functional activity Standing balance-Leahy Scale: Good Standing balance comment: Pt able to stand at sink and adjust gown and wash hands without LOB                            Cognition Arousal/Alertness: Awake/alert Behavior During Therapy: WFL for tasks assessed/performed Overall Cognitive Status: Within Functional Limits for tasks assessed                                        Exercises Other Exercises Other Exercises: Ankle ROM x 10 Other Exercises: Pelvic tilts x 10 in hook lying Other Exercises: Hooklying 90/90 leg lifts (w contralateral foot planted) with pelvic tilt x 10; cues to focus on keep back  pressed into bed and on keeping legs in 90 position.  Pt initially had difficulty keeping knee in position but able to with cues until she fatigued Other Exercises: Seated rows with yellow T band with cues to focus on pinching shoulder blades    General Comments General comments (skin integrity, edema, etc.):   Pt continues to report decreased sensation and tingling in hands, decreased sensation  in legs and ankles but report feet are improving.    She had c/o L ankle stiffness with walking.  Assessed when return to room .  No edema, no pain with palpation, does have decreased inversion compared to L side.  Educated on performing ankle ROM exercises.      Pertinent Vitals/Pain Pain Assessment: 0-10 Pain Score: 6  Pain Location: neck Pain Descriptors / Indicators: Aching;Guarding Pain Intervention(s): Limited activity within patient's tolerance;Monitored during session    Home Living                      Prior Function            PT Goals (current goals can now be found in the care plan section) Acute Rehab PT Goals Patient Stated Goal: to get out of here PT Goal Formulation: With patient Time For Goal Achievement: 09/11/19 Potential to Achieve Goals: Good Progress towards PT goals: Progressing toward goals    Frequency    Min 3X/week      PT Plan Current plan remains appropriate    Co-evaluation              AM-PAC PT "6 Clicks" Mobility   Outcome Measure  Help needed turning from your back to your side while in a flat bed without using bedrails?: None Help needed moving from lying on your back to sitting on the side of a flat bed without using bedrails?: None Help needed moving to and from a bed to a chair (including a wheelchair)?: None Help needed standing up from a chair using your arms (e.g., wheelchair or bedside chair)?: None Help needed to walk in hospital room?: None Help needed climbing 3-5 steps with a railing? : A Little 6 Click Score: 23    End of Session Equipment Utilized During Treatment: Gait belt Activity Tolerance: Patient tolerated treatment well Patient left: in bed;with call bell/phone within reach Nurse Communication: Mobility status;Other (comment) (pt safe to ambulate in room to bathroom independently) PT Visit Diagnosis: Difficulty in walking, not elsewhere classified (R26.2);Muscle weakness (generalized)  (M62.81)     Time: 1610-1640 PT Time Calculation (min) (ACUTE ONLY): 30 min  Charges:  $Gait Training: 8-22 mins $Therapeutic Exercise: 8-22 mins                     Abran Richard, PT Acute Rehab Services Pager [phone] [facility] [place] 09/04/2019, 5:34 PM

## 2019-09-04 NOTE — Progress Notes (Signed)
Nutrition Follow-up  DOCUMENTATION CODES:   Non-severe (moderate) malnutrition in context of social or environmental circumstances  INTERVENTION:  - Continue Boost Breeze po TID, each supplement provides 250 kcal and 9 grams of protein  - Continue MVI with minerals daily  - Continue drinking protein smoothie provided by family  -Encourage adequate po intake  NUTRITION DIAGNOSIS:   Moderate Malnutrition related to social / environmental circumstances as evidenced by mild fat depletion, mild muscle depletion, moderate muscle depletion, energy intake < 75% for > or equal to 3 months.  Ongoing  GOAL:   Patient will meet greater than or equal to 90% of their needs  Progressing  MONITOR:   PO intake, Supplement acceptance, Labs, I & O's, Weight trends  REASON FOR ASSESSMENT:   LOS    ASSESSMENT:   Pt admitted with severe neck pain 2/2 acute cervical discitis/osteomyelitisMSSA bacteremia, POA, pt now s/p C3-7 lami C3-6 PSIF. PMH significant for IVDU and EtOH abuse.   Pt with progressive worsening of liver function per MD. Antibiotics changed. GI consulted.   Pt not available at time of RD visit.  Pt continues to be hospitalized for IV abx. Pt intermittently refusing Boost Breeze.   PO Intake: 15-100% x last 8 recorded meals (56% average meal intake)  Labs reviewed. Elevated LFTs. Medications: Boost Breeze po TID, Folvite, Mag-Ox, MVI, Senokot-S, Thiamine  Diet Order:   Diet Order            Diet regular Room service appropriate? Yes; Fluid consistency: Thin  Diet effective now                 EDUCATION NEEDS:   Education needs have been addressed  Skin:  Skin Assessment: Skin Integrity Issues: Skin Integrity Issues:: Incisions Incisions: neck  Last BM:  7/28  Height:   Ht Readings from Last 1 Encounters:  08/08/19 5\' 7"  (1.702 m)    Weight:   Wt Readings from Last 1 Encounters:  08/08/19 61.2 kg    BMI:  Body mass index is 21.13  kg/m.  Estimated Nutritional Needs:   Kcal:  1850-2050  Protein:  95-105 grams  Fluid:  >1.8L/d    Larkin Ina, MS, RD, LDN RD pager number and weekend/on-call pager number located in [place].

## 2019-09-05 ENCOUNTER — Encounter (HOSPITAL_COMMUNITY): Payer: Self-pay | Admitting: Internal Medicine

## 2019-09-05 LAB — CBC WITH DIFFERENTIAL/PLATELET
Abs Immature Granulocytes: 0.01 10*3/uL (ref 0.00–0.07)
Basophils Absolute: 0 10*3/uL (ref 0.0–0.1)
Basophils Relative: 1 %
Eosinophils Absolute: 0.3 10*3/uL (ref 0.0–0.5)
Eosinophils Relative: 7 %
HCT: 32.7 % — ABNORMAL LOW (ref 36.0–46.0)
Hemoglobin: 10.6 g/dL — ABNORMAL LOW (ref 12.0–15.0)
Immature Granulocytes: 0 %
Lymphocytes Relative: 41 %
Lymphs Abs: 1.5 10*3/uL (ref 0.7–4.0)
MCH: 29.8 pg (ref 26.0–34.0)
MCHC: 32.4 g/dL (ref 30.0–36.0)
MCV: 91.9 fL (ref 80.0–100.0)
Monocytes Absolute: 0.6 10*3/uL (ref 0.1–1.0)
Monocytes Relative: 16 %
Neutro Abs: 1.2 10*3/uL — ABNORMAL LOW (ref 1.7–7.7)
Neutrophils Relative %: 35 %
Platelets: 296 10*3/uL (ref 150–400)
RBC: 3.56 MIL/uL — ABNORMAL LOW (ref 3.87–5.11)
RDW: 13 % (ref 11.5–15.5)
WBC: 3.6 10*3/uL — ABNORMAL LOW (ref 4.0–10.5)
nRBC: 0 % (ref 0.0–0.2)

## 2019-09-05 LAB — HEPATIC FUNCTION PANEL
ALT: 347 U/L — ABNORMAL HIGH (ref 0–44)
AST: 431 U/L — ABNORMAL HIGH (ref 15–41)
Albumin: 2.6 g/dL — ABNORMAL LOW (ref 3.5–5.0)
Alkaline Phosphatase: 302 U/L — ABNORMAL HIGH (ref 38–126)
Bilirubin, Direct: 0.5 mg/dL — ABNORMAL HIGH (ref 0.0–0.2)
Indirect Bilirubin: 0.5 mg/dL (ref 0.3–0.9)
Total Bilirubin: 1 mg/dL (ref 0.3–1.2)
Total Protein: 6.1 g/dL — ABNORMAL LOW (ref 6.5–8.1)

## 2019-09-05 LAB — MAGNESIUM: Magnesium: 1.4 mg/dL — ABNORMAL LOW (ref 1.7–2.4)

## 2019-09-05 MED ORDER — MAGNESIUM OXIDE 400 (241.3 MG) MG PO TABS
800.0000 mg | ORAL_TABLET | Freq: Every day | ORAL | Status: DC
  Administered 2019-09-06 – 2019-09-08 (×3): 800 mg via ORAL
  Filled 2019-09-05 (×3): qty 2

## 2019-09-05 MED ORDER — MAGNESIUM SULFATE 2 GM/50ML IV SOLN
2.0000 g | Freq: Once | INTRAVENOUS | Status: DC
  Filled 2019-09-05: qty 50

## 2019-09-05 NOTE — Progress Notes (Signed)
PROGRESS NOTE    Monica Eaton  HAL:[id number] DOB: Jun 21, 1975 DOA: 08/07/2019 PCP: Patient, No Pcp Per   Brief Narrative:  Patient is a 44 year old female with history of IV drug abuse, tobacco abuse who presents with severe neck pain and upper back pain.  In the emergency department she was found to have leukocytosis, elevated CRP.  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  Neurosurgery was consulted and she has required surgical intervention on 08/08/2019.  She was found to have MSSA bacteremia and started on IV antibiotics.  Antibiotics changed to doxycycline due   to progressive elevated liver function test.Suspicion for DILI.  Plan for discharge after improvement in the LFTs.   Assessment & Plan:   Principal Problem:   Discitis of cervical region Active Problems:   Cervical pain (neck)   IVDU (intravenous drug user)   Tobacco abuse   Neck pain   Bandemia   MSSA bacteremia   Epidural abscess   Hardware complicating wound infection ([place])   Malnutrition of moderate degree   LFT elevation   Chronic hepatitis C with hepatic coma (HCC)   Hepatitis   MSSA cervical discitis/osteomyelitis: Status post C3-7 laminectomy , C3-6 PSIF.  Neurosurgery was following.  She will follow-up with neurosurgery as an outpatient.  MSSA bacteremia: She was on cefazolin.  Plan was to continue 4 to 5 weeks of IV cefazolin as an inpatient and switch to oral therapy.  Rifampin has been stopped due to elevated liver enzymes.  2D echo did not show any vegetations.  She needs long-term oral antibiotics given placement of hardware.  ID recalled earlier because of progressive worsening of liver function.  Stopped cefazolin and started on doxycycline. Echo repeated without finding of any vegetation.  She will follow-up with ID as an outpatient.  IV drug use: Counseled for cessation.  HIV, hepatitis B and hepatitis C  negative.  UDS was positive for benzodiazepines, opiates, THC.  Patient is also a smoker.  Continue nicotine patch. We offered drug rehabilitation and also follow-up with pain management for methadone but patient declined.  Elevated LFTs: High suspicion for DILI  secondary to cefazolin. liver ultrasound unremarkable.    Continue to monitor LFTs.  Hepatitis panel negative twice.Check daily LFTs.  She does not have any right upper quadrant tenderness or hepatomegaly. GI folllowing  Neck pain/back pain: On MS Contin, Flexeril.  Continue supportive care  Normocytic anemia: Currently hemoglobin stable.  Hypomagnesemia: Supplemented  Nutrition Problem: Moderate Malnutrition Etiology: social / environmental circumstances      DVT prophylaxis: Lovenox Code Status: Full code Family Communication: Discussed with father on phone on 09/03/2019 Status is: Inpatient  Remains inpatient appropriate because:IV treatments appropriate due to intensity of illness or inability to take PO   Dispo: The patient is from: Home              Anticipated d/c is to: Home              Anticipated d/c date is:1-2 days              Patient currently is not medically stable to d/c.  Anticipating improvement in the liver function.    Consultants: Neuro surgery, ID  Procedures: Cervical laminectomy and fusion  Antimicrobials:  Anti-infectives (From admission, onward)   Start     Dose/Rate Route Frequency Ordered Stop   09/03/19 2200  doxycycline (VIBRA-TABS) tablet 100 mg  Discontinue     100 mg Oral Every 12 hours 09/03/19 1445     08/12/19 1700  rifampin (RIFADIN) capsule 300 mg  Status:  Discontinued        300 mg Oral 2 times daily with meals 08/12/19 1353 08/28/19 1415   08/11/19 1400  ceFAZolin (ANCEF) IVPB 2g/100 mL premix  Status:  Discontinued        2 g 200 mL/hr over 30 Minutes Intravenous Every 8 hours 08/11/19 1159 09/03/19 1445   08/10/19 1400  vancomycin (VANCOREADY) IVPB 750 mg/150 mL   Status:  Discontinued        750 mg 150 mL/hr over 60 Minutes Intravenous Every 8 hours 08/10/19 1254 08/11/19 1159   08/08/19 2042  bacitracin 50,000 Units in sodium chloride 0.9 % 500 mL irrigation  Status:  Discontinued          As needed 08/08/19 2043 08/08/19 2350   08/08/19 1800  vancomycin (VANCOCIN) IVPB 1000 mg/200 mL premix  Status:  Discontinued        1,000 mg 200 mL/hr over 60 Minutes Intravenous Every 12 hours 08/08/19 0644 08/10/19 0919   08/08/19 0800  levofloxacin (LEVAQUIN) IVPB 750 mg  Status:  Discontinued        750 mg 100 mL/hr over 90 Minutes Intravenous Every 24 hours 08/08/19 0644 08/09/19 0115   08/08/19 0645  vancomycin (VANCOREADY) IVPB 1250 mg/250 mL        1,250 mg 166.7 mL/hr over 90 Minutes Intravenous  Once 08/08/19 1540 08/08/19 0916      Subjective: Patient seen and examined at the bedside this morning.  Hemodynamically stable.  No complains today  Objective: Vitals:   09/04/19 2015 09/04/19 2359 09/05/19 0344 09/05/19 0810  BP: 111/79 (!) 133/89 128/79 (!) 134/90  Pulse: 97 92 87 82  Resp: 16 17 16 16   Temp: 98.1 F (36.7 C) 98.2 F (36.8 C) 98.5 F (36.9 C) 98.2 F (36.8 C)  TempSrc: Oral Oral Oral Oral  SpO2: 100% 100% 99% 100%  Weight:      Height:        Intake/Output Summary (Last 24 hours) at 09/05/2019 0857 Last data filed at 09/04/2019 1548 Gross per 24 hour  Intake 1620.53 ml  Output --  Net 1620.53 ml   Filed Weights   08/07/19 2205 08/08/19 1253 08/08/19 1823  Weight: 59 kg 59 kg 61.2 kg    Examination:    General exam: Appears calm and comfortable ,Not in distress,average built HEENT:PERRL,Oral mucosa moist, Ear/Nose normal on gross exam,healed surgical wound on the back of the neck Respiratory system: Bilateral equal air entry, normal vesicular breath sounds, no wheezes or crackles  Cardiovascular system: S1 & S2 heard, RRR. No JVD, murmurs, rubs, gallops or clicks. Gastrointestinal system: Abdomen is  nondistended, soft and nontender. No organomegaly or masses felt. Normal bowel sounds heard. Central nervous system: Alert and oriented. No focal neurological deficits. Extremities: No edema, no clubbing ,no cyanosis, distal peripheral pulses palpable. Skin: No rashes, lesions or ulcers,no icterus ,no pallor    data Reviewed: I have personally reviewed following labs and imaging studies  CBC: Recent Labs  Lab 08/29/19 1233 08/30/19 0220 09/01/19 0316 09/05/19 0436  WBC 4.9 4.2 4.5 3.6*  NEUTROABS 2.7 1.9 2.0 PENDING  HGB 11.0* 10.4* 10.6* 10.6*  HCT 34.8* 32.9* 33.0* 32.7*  MCV 92.6 92.4 92.7 91.9  PLT 466* 418* 377 086   Basic Metabolic Panel: Recent Labs  Lab 08/29/19 1233 08/30/19 0220  09/01/19 0316 09/02/19 0617 09/03/19 0313 09/05/19 0436  NA 138 139 137 136 138  --   K 3.9 3.9 4.2 4.4 4.1  --   CL 101 101 99 100 101  --   CO2 28 29 28 29 29   --   GLUCOSE 109* 136* 111* 107* 112*  --   BUN <5* <5* 6 <5* <5*  --   CREATININE 0.45 0.45 0.51 0.52 0.50  --   CALCIUM 9.5 9.2 9.1 8.9 9.0  --   MG  --  1.6* 1.6* 1.7  --  1.4*   GFR: Estimated Creatinine Clearance: 88.5 mL/min (by C-G formula based on SCr of 0.5 mg/dL). Liver Function Tests: Recent Labs  Lab 09/01/19 0316 09/02/19 0617 09/03/19 0313 09/04/19 0924 09/05/19 0436  AST 341* 390* 421* 451* 431*  ALT 236* 245* 271* 305* 347*  ALKPHOS 230* 245* 281* 283* 302*  BILITOT 0.5 0.3 0.5 0.6 1.0  PROT 6.3* 6.0* 6.4* 5.9* 6.1*  ALBUMIN 2.7* 2.6* 2.7* 2.5* 2.6*   No results for input(s): LIPASE, AMYLASE in the last 168 hours. No results for input(s): AMMONIA in the last 168 hours. Coagulation Profile: Recent Labs  Lab 08/29/19 1233  INR 1.0   Cardiac Enzymes: No results for input(s): CKTOTAL, CKMB, CKMBINDEX, TROPONINI in the last 168 hours. BNP (last 3 results) No results for input(s): PROBNP in the last 8760 hours. HbA1C: No results for input(s): HGBA1C in the last 72 hours. CBG: No results for  input(s): GLUCAP in the last 168 hours. Lipid Profile: No results for input(s): CHOL, HDL, LDLCALC, TRIG, CHOLHDL, LDLDIRECT in the last 72 hours. Thyroid Function Tests: No results for input(s): TSH, T4TOTAL, FREET4, T3FREE, THYROIDAB in the last 72 hours. Anemia Panel: No results for input(s): VITAMINB12, FOLATE, FERRITIN, TIBC, IRON, RETICCTPCT in the last 72 hours. Sepsis Labs: No results for input(s): PROCALCITON, LATICACIDVEN in the last 168 hours.  No results found for this or any previous visit (from the past 240 hour(s)).       Radiology Studies: ECHOCARDIOGRAM COMPLETE  Result Date: 09/04/2019    ECHOCARDIOGRAM REPORT   Patient Name:   Monica Eaton Date of Exam: 09/04/2019 Medical Rec #:  [id number]         Height:       67.0 in Accession #:    [id number]        Weight:       134.9 lb Date of Birth:  1975-08-09         BSA:          74.711 m Patient Age:    71 years          BP:           137/90 mmHg Patient Gender: F                 HR:           87 bpm. Exam Location:  Inpatient Procedure: 2D Echo, Cardiac Doppler and Color Doppler Indications:    Endocarditis  History:        Patient has prior history of Echocardiogram examinations, most                 recent 08/09/2019. Signs/Symptoms:Bacteremia; Risk Factors:Current                 Smoker. IVDU.  Sonographer:    Roseanna Rainbow RDCS Referring Phys: [id number] [address]EL[address] N [address]AN DAM  Sonographer Comments: Technically difficult study due to  poor echo windows. IMPRESSIONS  1. Left ventricular ejection fraction, by estimation, is 55 to 60%. The left ventricle has normal function. The left ventricle has no regional wall motion abnormalities. There is mild left ventricular hypertrophy. Left ventricular diastolic parameters were normal.  2. Right ventricular systolic function is normal. The right ventricular size is normal. Tricuspid regurgitation signal is inadequate for assessing PA pressure.  3. The mitral valve is normal in structure. Trivial mitral  valve regurgitation.  4. The aortic valve is tricuspid. Aortic valve regurgitation is not visualized. No aortic stenosis is present.  5. The inferior vena cava is dilated in size with <50% respiratory variability, suggesting right atrial pressure of 15 mmHg. Conclusion(s)/Recommendation(s): No vegetation seen. If high clinical suspicion for endocarditis, consider TEE. FINDINGS  Left Ventricle: Left ventricular ejection fraction, by estimation, is 55 to 60%. The left ventricle has normal function. The left ventricle has no regional wall motion abnormalities. The left ventricular internal cavity size was normal in size. There is  mild left ventricular hypertrophy. Left ventricular diastolic parameters were normal. Right Ventricle: The right ventricular size is normal. Right vetricular wall thickness was not assessed. Right ventricular systolic function is normal. Tricuspid regurgitation signal is inadequate for assessing PA pressure. Left Atrium: Left atrial size was normal in size. Right Atrium: Right atrial size was normal in size. Pericardium: There is no evidence of pericardial effusion. Mitral Valve: The mitral valve is normal in structure. Trivial mitral valve regurgitation. Tricuspid Valve: The tricuspid valve is normal in structure. Tricuspid valve regurgitation is trivial. Aortic Valve: The aortic valve is tricuspid. Aortic valve regurgitation is not visualized. No aortic stenosis is present. Pulmonic Valve: The pulmonic valve was not well visualized. Pulmonic valve regurgitation is not visualized. Aorta: The aortic root and ascending aorta are structurally normal, with no evidence of dilitation. Venous: The inferior vena cava is dilated in size with less than 50% respiratory variability, suggesting right atrial pressure of 15 mmHg. IAS/Shunts: No atrial level shunt detected by color flow Doppler.  LEFT VENTRICLE PLAX 2D LVIDd:         4.50 cm     Diastology LVIDs:         3.20 cm     LV e' lateral:   13.60  cm/s LV PW:         1.60 cm     LV E/e' lateral: 7.2 LV IVS:        1.10 cm     LV e' medial:    9.46 cm/s LVOT diam:     1.90 cm     LV E/e' medial:  10.3 LV SV:         43 LV SV Index:   25 LVOT Area:     2.84 cm  LV Volumes (MOD) LV vol d, MOD A2C: 72.3 ml LV vol d, MOD A4C: 69.4 ml LV vol s, MOD A2C: 30.4 ml LV vol s, MOD A4C: 30.3 ml LV SV MOD A2C:     41.9 ml LV SV MOD A4C:     69.4 ml LV SV MOD BP:      40.4 ml RIGHT VENTRICLE            IVC RV S prime:     9.36 cm/s  IVC diam: 2.10 cm TAPSE (M-mode): 2.2 cm LEFT ATRIUM             Index       RIGHT ATRIUM  Index LA diam:        2.60 cm 1.52 cm/m  RA Area:     11.10 cm LA Vol (A2C):   29.6 ml 17.30 ml/m RA Volume:   24.30 ml  14.20 ml/m LA Vol (A4C):   35.2 ml 20.58 ml/m LA Biplane Vol: 34.6 ml 20.23 ml/m  AORTIC VALVE LVOT Vmax:   80.30 cm/s LVOT Vmean:  55.800 cm/s LVOT VTI:    0.152 m  AORTA Ao Root diam: 3.20 cm Ao Asc diam:  3.20 cm MITRAL VALVE MV Area (PHT): 5.84 cm    SHUNTS MV Decel Time: 130 msec    Systemic VTI:  0.15 m MV E velocity: 97.30 cm/s  Systemic Diam: 1.90 cm MV A velocity: 53.10 cm/s MV E/A ratio:  1.83 Oswaldo Milian MD Electronically signed by Oswaldo Milian MD Signature Date/Time: 09/04/2019/10:37:48 PM    Final         Scheduled Meds: . Chlorhexidine Gluconate Cloth  6 each Topical Daily  . doxycycline  100 mg Oral Q12H  . enoxaparin (LOVENOX) injection  40 mg Subcutaneous Q24H  . feeding supplement  1 Container Oral TID BM  . folic acid  1 mg Oral Daily  . magnesium oxide  400 mg Oral Daily  . morphine  45 mg Oral Q12H  . multivitamin with minerals  1 tablet Oral Daily  . nicotine  14 mg Transdermal Daily  . senna-docusate  1 tablet Oral BID  . sodium chloride flush  10-40 mL Intracatheter Q12H  . thiamine  100 mg Oral Daily   Continuous Infusions: . sodium chloride 10 mL/hr at 08/24/19 2340     LOS: 28 days    Time spent: 25 mins.More than 50% of that time was spent in  counseling and/or coordination of care.      Shelly Coss, MD [facility] P7/30/2021, 8:57 AM

## 2019-09-05 NOTE — Progress Notes (Signed)
PT Cancellation Note  Patient Details Name: Monica Eaton MRN: [id number] DOB: [birth year]   Cancelled Treatment:    Reason Eval/Treat Not Completed: Other (comment) Pt reports they have changed her pain meds and she cannot have anything for 3 hours and she is hurting.  States she just finished shower and does not feel like doing anything else. Will f/u as able. Abran Richard, PT Acute Rehab Services Pager [phone] [facility] [place] 09/05/2019, 3:18 PM

## 2019-09-05 NOTE — Progress Notes (Signed)
[facility]  Date of Admission:  08/07/2019     Total days of antibiotics 28         ASSESSMENT:  Monica Eaton TTE is without evidence of endocarditis or heart failure. Elevated liver enzymes possibly related to Cefazolin. Primary team trending liver function tests. Antibiotic switched to oral doxycyline. Once LFT's improved she will be okay for discharge from ID perspective. Plan is to continue doxycycline indefinitely given scope of infection and hardware placement. [facility] to get supply of doxycyline to room. Will arrange follow up in ID clinic. Spoke with IMTS program about possible Suboxone.  PLAN:  1. Continue doxycycline 2. Trend LFTs 3. Okay for discharge from ID standpoint per primary team recommendation and LFTs 4. [facility] to bring doxycycline to bedside at discharge.  5. Follow up in the ID clinic.,  ID will sign off and be available as needed.   Principal Problem:   Discitis of cervical region Active Problems:   Cervical pain (neck)   IVDU (intravenous drug user)   Tobacco abuse   Neck pain   Bandemia   MSSA bacteremia   Epidural abscess   Hardware complicating wound infection ([place])   Malnutrition of moderate degree   LFT elevation   Chronic hepatitis C with hepatic coma (HCC)   Hepatitis   . doxycycline  100 mg Oral Q12H  . enoxaparin (LOVENOX) injection  40 mg Subcutaneous Q24H  . feeding supplement  1 Container Oral TID BM  . folic acid  1 mg Oral Daily  . magnesium oxide  400 mg Oral Daily  . morphine  45 mg Oral Q12H  . multivitamin with minerals  1 tablet Oral Daily  . nicotine  14 mg Transdermal Daily  . senna-docusate  1 tablet Oral BID  . sodium chloride flush  10-40 mL Intracatheter Q12H  . thiamine  100 mg Oral Daily    SUBJECTIVE:  Afebrile overnight with no acute events. LFTs mixed with some improvements. Anxious over test results and feeling okay.   Allergies    Allergen Reactions  . Gabapentin Other (See Comments)    Causes extreme agitation.   . Cefaclor   . Cephalosporins   . Nitrofurantoin   . Penicillins     Spoke with patient's family. They report that Monica Eaton had a rash/hives when she was 1-17 years old. She did not have any shortness of breath and they do not recall having to take her to the doctor's office or hospital to treat the reaction.      Review of Systems: Review of Systems  Constitutional: Negative for chills, fever and weight loss.  Respiratory: Negative for cough, shortness of breath and wheezing.   Cardiovascular: Negative for chest pain and leg swelling.  Gastrointestinal: Negative for abdominal pain, constipation, diarrhea, nausea and vomiting.  Musculoskeletal: Positive for neck pain.  Skin: Negative for rash.      OBJECTIVE: Vitals:   09/04/19 2015 09/04/19 2359 09/05/19 0344 09/05/19 0810  BP: 111/79 (!) 133/89 128/79 (!) 134/90  Pulse: 97 92 87 82  Resp: 16 17 16 16   Temp: 98.1 F (36.7 C) 98.2 F (36.8 C) 98.5 F (36.9 C) 98.2 F (36.8 C)  TempSrc: Oral Oral Oral Oral  SpO2: 100% 100% 99% 100%  Weight:      Height:       Body mass index is 21.13 kg/m.  Physical Exam Constitutional:      General:  She is not in acute distress.    Appearance: She is well-developed.     Comments: Lying in bed with head of bed elevated; pleasant; anxious  Cardiovascular:     Rate and Rhythm: Normal rate and regular rhythm.     Heart sounds: Normal heart sounds.  Pulmonary:     Effort: Pulmonary effort is normal.     Breath sounds: Normal breath sounds.  Skin:    General: Skin is warm and dry.  Neurological:     Mental Status: She is alert and oriented to person, place, and time.  Psychiatric:        Behavior: Behavior normal.        Thought Content: Thought content normal.        Judgment: Judgment normal.     Lab Results Lab Results  Component Value Date   WBC 3.6 (L) 09/05/2019   HGB 10.6 (L)  09/05/2019   HCT 32.7 (L) 09/05/2019   MCV 91.9 09/05/2019   PLT 296 09/05/2019    Lab Results  Component Value Date   CREATININE 0.50 09/03/2019   BUN <5 (L) 09/03/2019   NA 138 09/03/2019   K 4.1 09/03/2019   CL 101 09/03/2019   CO2 29 09/03/2019    Lab Results  Component Value Date   ALT 347 (H) 09/05/2019   AST 431 (H) 09/05/2019   ALKPHOS 302 (H) 09/05/2019   BILITOT 1.0 09/05/2019     Microbiology: No results found for this or any previous visit (from the past 240 hour(s)).   Terri Piedra, NP [facility]  09/05/2019  10:19 AM

## 2019-09-05 NOTE — Progress Notes (Addendum)
Daily Rounding Note  09/05/2019, 3:37 PM  LOS: 28 days   SUBJECTIVE:   Chief complaint: elevated LFTs, suspect DILI    C/o neck pain No GI complaints.  appetitie "not great"  OBJECTIVE:         Vital signs in last 24 hours:    Temp:  [98.1 F (36.7 C)-98.5 F (36.9 C)] 98.2 F (36.8 C) (07/30 1534) Pulse Rate:  [82-97] 88 (07/30 1534) Resp:  [16-17] 16 (07/30 1534) BP: (111-139)/(79-98) 139/92 (07/30 1534) SpO2:  [99 %-100 %] 99 % (07/30 1534) Last BM Date: 09/03/19 Filed Weights   08/07/19 2205 08/08/19 1253 08/08/19 1823  Weight: 59 kg 59 kg 61.2 kg   General: sallow, not toxic or acutely ill looking   Heart: RRR Chest: clear.  No dyspnea Abdomen: soft, active BS, NT  Extremities: no CCE Neuro/Psych:  Flat affect.  Fully alert and oriented.    Intake/Output from previous day: 07/29 0701 - 07/30 0700 In: 1620.5 [I.V.:1620.5] Out: -   Intake/Output this shift: Total I/O In: 320 [P.O.:320] Out: -   Lab Results: Recent Labs    09/05/19 0436  WBC 3.6*  HGB 10.6*  HCT 32.7*  PLT 296   BMET Recent Labs    09/03/19 0313  NA 138  K 4.1  CL 101  CO2 29  GLUCOSE 112*  BUN <5*  CREATININE 0.50  CALCIUM 9.0   LFT Recent Labs    09/03/19 0313 09/04/19 0924 09/05/19 0436  PROT 6.4* 5.9* 6.1*  ALBUMIN 2.7* 2.5* 2.6*  AST 421* 451* 431*  ALT 271* 305* 347*  ALKPHOS 281* 283* 302*  BILITOT 0.5 0.6 1.0  BILIDIR  --  0.5* 0.5*  IBILI  --  0.1* 0.5   PT/INR No results for input(s): LABPROT, INR in the last 72 hours. Hepatitis Panel Recent Labs    09/03/19 1506  HEPBSAG NON REACTIVE  HEPAIGM NON REACTIVE    Studies/Results: ECHOCARDIOGRAM COMPLETE  Result Date: 09/04/2019    ECHOCARDIOGRAM REPORT   Patient Name:   Monica Eaton Date of Exam: 09/04/2019 Medical Rec #:  [id number]         Height:       67.0 in Accession #:    [id number]        Weight:       134.9 lb Date of Birth:   March 24, 1975         BSA:          25.711 m Patient Age:    44 years          BP:           137/90 mmHg Patient Gender: F                 HR:           87 bpm. Exam Location:  Inpatient Procedure: 2D Echo, Cardiac Doppler and Color Doppler Indications:    Endocarditis  History:        Patient has prior history of Echocardiogram examinations, most                 recent 08/09/2019. Signs/Symptoms:Bacteremia; Risk Factors:Current                 Smoker. IVDU.  Sonographer:    Roseanna Rainbow RDCS Referring Phys: [id number] CORNELIUS N VAN DAM  Sonographer Comments: Technically difficult study due to poor echo windows. IMPRESSIONS  1. Left ventricular  ejection fraction, by estimation, is 55 to 60%. The left ventricle has normal function. The left ventricle has no regional wall motion abnormalities. There is mild left ventricular hypertrophy. Left ventricular diastolic parameters were normal.  2. Right ventricular systolic function is normal. The right ventricular size is normal. Tricuspid regurgitation signal is inadequate for assessing PA pressure.  3. The mitral valve is normal in structure. Trivial mitral valve regurgitation.  4. The aortic valve is tricuspid. Aortic valve regurgitation is not visualized. No aortic stenosis is present.  5. The inferior vena cava is dilated in size with <50% respiratory variability, suggesting right atrial pressure of 15 mmHg. Conclusion(s)/Recommendation(s): No vegetation seen. If high clinical suspicion for endocarditis, consider TEE. FINDINGS  Left Ventricle: Left ventricular ejection fraction, by estimation, is 55 to 60%. The left ventricle has normal function. The left ventricle has no regional wall motion abnormalities. The left ventricular internal cavity size was normal in size. There is  mild left ventricular hypertrophy. Left ventricular diastolic parameters were normal. Right Ventricle: The right ventricular size is normal. Right vetricular wall thickness was not assessed. Right  ventricular systolic function is normal. Tricuspid regurgitation signal is inadequate for assessing PA pressure. Left Atrium: Left atrial size was normal in size. Right Atrium: Right atrial size was normal in size. Pericardium: There is no evidence of pericardial effusion. Mitral Valve: The mitral valve is normal in structure. Trivial mitral valve regurgitation. Tricuspid Valve: The tricuspid valve is normal in structure. Tricuspid valve regurgitation is trivial. Aortic Valve: The aortic valve is tricuspid. Aortic valve regurgitation is not visualized. No aortic stenosis is present. Pulmonic Valve: The pulmonic valve was not well visualized. Pulmonic valve regurgitation is not visualized. Aorta: The aortic root and ascending aorta are structurally normal, with no evidence of dilitation. Venous: The inferior vena cava is dilated in size with less than 50% respiratory variability, suggesting right atrial pressure of 15 mmHg. IAS/Shunts: No atrial level shunt detected by color flow Doppler.  LEFT VENTRICLE PLAX 2D LVIDd:         4.50 cm     Diastology LVIDs:         3.20 cm     LV e' lateral:   13.60 cm/s LV PW:         1.60 cm     LV E/e' lateral: 7.2 LV IVS:        1.10 cm     LV e' medial:    9.46 cm/s LVOT diam:     1.90 cm     LV E/e' medial:  10.3 LV SV:         43 LV SV Index:   25 LVOT Area:     2.84 cm  LV Volumes (MOD) LV vol d, MOD A2C: 72.3 ml LV vol d, MOD A4C: 69.4 ml LV vol s, MOD A2C: 30.4 ml LV vol s, MOD A4C: 30.3 ml LV SV MOD A2C:     41.9 ml LV SV MOD A4C:     69.4 ml LV SV MOD BP:      40.4 ml RIGHT VENTRICLE            IVC RV S prime:     9.36 cm/s  IVC diam: 2.10 cm TAPSE (M-mode): 2.2 cm LEFT ATRIUM             Index       RIGHT ATRIUM           Index LA diam:  2.60 cm 1.52 cm/m  RA Area:     11.10 cm LA Vol (A2C):   29.6 ml 17.30 ml/m RA Volume:   24.30 ml  14.20 ml/m LA Vol (A4C):   35.2 ml 20.58 ml/m LA Biplane Vol: 34.6 ml 20.23 ml/m  AORTIC VALVE LVOT Vmax:   80.30 cm/s LVOT  Vmean:  55.800 cm/s LVOT VTI:    0.152 m  AORTA Ao Root diam: 3.20 cm Ao Asc diam:  3.20 cm MITRAL VALVE MV Area (PHT): 5.84 cm    SHUNTS MV Decel Time: 130 msec    Systemic VTI:  0.15 m MV E velocity: 97.30 cm/s  Systemic Diam: 1.90 cm MV A velocity: 53.10 cm/s MV E/A ratio:  1.83 Oswaldo Milian MD Electronically signed by Oswaldo Milian MD Signature Date/Time: 09/04/2019/10:37:48 PM    Final    Scheduled Meds: . doxycycline  100 mg Oral Q12H  . enoxaparin (LOVENOX) injection  40 mg Subcutaneous Q24H  . feeding supplement  1 Container Oral TID BM  . folic acid  1 mg Oral Daily  . [START ON 09/06/2019] magnesium oxide  800 mg Oral Daily  . multivitamin with minerals  1 tablet Oral Daily  . nicotine  14 mg Transdermal Daily  . senna-docusate  1 tablet Oral BID  . sodium chloride flush  10-40 mL Intracatheter Q12H  . thiamine  100 mg Oral Daily   Continuous Infusions: . sodium chloride Stopped (09/05/19 1010)  . magnesium sulfate bolus IVPB     PRN Meds:.sodium chloride, bisacodyl, cyclobenzaprine, oxyCODONE, polyethylene glycol, sodium chloride flush, traZODone   ASSESMENT:   *   Elevated LFTs.  Suspect DILI due to Cefazolin.    currentl abx is doxycycline for c spine discitis, MSSA bacteremia.  LFTs are rising. INR normal 1 week ago.  Hepatitis serologies non-reactive.   HCV quant in process.   HIV non-reative, HIV RNA unable to calculate/<20.     PLAN   *   Continue daily LFTs.  Check inr in AM May need liver biospy    Azucena Freed  09/05/2019, 3:37 PM Phone [phone]

## 2019-09-05 NOTE — Progress Notes (Signed)
Occupational Therapy Treatment Patient Details Name: Monica Eaton MRN: [id number] DOB: 04-03-1975 Today's Date: 09/05/2019    History of present illness Monica Eaton is a 44 y.o. female with medical history significant for tobacco abuse and IV drug abuse who presents to the emergency department due to sudden onset of upper back pain which started about 3 -4 hours PTA.  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 Pt underwent, s/p C3-6 OLaminectomy & fusion. Pt is remaining hospitalized for IV antibiotics.   OT comments  Upon arrival, pt requesting shower, up in bed and agreeable to skilled-OT session. Pt able to complete mobility with supervision-min guard due to tight space in bathroom with showering. Pt able to bathe in shower sitting on [facility] with supervision and verbal cues for sequencing and cervical precautions with LB bathing. Pt experiencing increased pain and difficulty needing max A donning socks after shower - believe pt would benefit from AE instruction for LB bathing and dressing for safety and independence. Pt educated on energy conservation and sequencing plan for shower to limit pain and exertion upon going home. Pt expressed increased anxiety about going home due to lack of physical assist from parents. Pt reporting prolonged numbness and decreased FM coordination in hands and legs - needing assist with opening soap bottle and soda. Believe dc plans are still appropriate. Will follow acutely.   Follow Up Recommendations  Home health OT    Equipment Recommendations  3 in 1 bedside commode;Other (comment) (hip kit with AE)       Precautions / Restrictions Precautions Precautions: Cervical       Mobility Bed Mobility Overal bed mobility: Modified Independent Bed Mobility: Rolling;Sidelying to Sit;Sit to Sidelying Rolling: Supervision Sidelying to sit: Supervision     Sit to sidelying: Supervision General bed  mobility comments: no physical assist needed, + use of rail and HOB up  Transfers  Overall Transfer Level: Needs Assistance Equipment Used: None  Transfers: Sit to/from Stand; Technical brewer Transfers Sit to Stand: Supervision;Min Guard Stand Pivot Transfers: Supervision;Min Guard General transfer comment: min guard to supervision with transfers in bathroom due to tight space and safety                    Balance Overall balance assessment: Needs assistance Sitting-balance support: No upper extremity supported Sitting balance-Leahy Scale: Good Sitting balance - Comments: able to sit EOB without UE, supervision only   Standing balance support: During functional activity Standing balance-Leahy Scale: Good Standing balance comment: Pt able to stand at sink and apply deodorant without LOB                           ADL either performed or assessed with clinical judgement   ADL Overall ADL's : Needs assistance/impaired     Grooming: Applying deodorant;Supervision/safety;Standing   Upper Body Bathing: Supervision/ safety;Sitting;Cueing for safety;Cueing for sequencing;Cueing for compensatory techniques Upper Body Bathing Details (indicate cue type and reason): washed upper body in shower sitting on BSC. Pt needing verbal cues for problem solving without getting incision wet Lower Body Bathing: Supervison/ safety;Adhering to back precautions;Sitting/lateral leans;Sit to/from stand;Cueing for back precautions;Cueing for sequencing;Cueing for safety Lower Body Bathing Details (indicate cue type and reason): supervision for shower seated on BSC. verbal cues for adhering to back precautions pt reporting increased pain in neck with those movements Upper Body Dressing : Minimal assistance;Standing Upper Body Dressing Details (indicate cue type and  reason): pt donned new gown in standing with min A Lower Body Dressing: Maximal assistance;Cueing for safety;Cueing for  sequencing;Cueing for back precautions;Adhering to back precautions;Sitting/lateral leans;Sit to/from stand Lower Body Dressing Details (indicate cue type and reason): pt with increased difficulty putting on socks after shower due to increased pain. Toilet Transfer: Social worker Details (indicate cue type and reason): supervision for safety Toileting- Clothing Manipulation and Hygiene: Set up;Sitting/lateral lean Toileting - Clothing Manipulation Details (indicate cue type and reason): set up to help with toilet paper Tub/ Shower Transfer: Supervision/safety;3 in 1;Ambulation;Grab bars (hand held shower) Tub/Shower Transfer Details (indicate cue type and reason): pt able to complete shower transfer with supervision Functional mobility during ADLs: Supervision/safety;Min guard;Cueing for safety;Cueing for sequencing General ADL Comments: Pt requesting shower and working on compensatory techniques for LB ADLs. Pt would benefit from education on AE               Cognition Arousal/Alertness: Awake/alert Behavior During Therapy: [facility] for tasks assessed/performed Overall Cognitive Status: Impaired/Different from baseline Area of Impairment: Problem solving;Safety/judgement                         Safety/Judgement: Decreased awareness of safety   Problem Solving: Slow processing;Requires verbal cues;Requires tactile cues General Comments: Pt needing multimodal cues for sequencing and problem solving through shower task and changing clothes              General Comments Pt continues to report decreased sensation in BLEs and BUEs. Pt expressed concerns with safety going home with her parents due to lack of physical assist. Pt educated on compensatory techniques for safety    Pertinent Vitals/ Pain       Pain Assessment: Faces Faces Pain Scale: Hurts whole lot Pain Location: neck Pain Descriptors / Indicators:  Aching;Guarding;Discomfort;Grimacing Pain Intervention(s): Monitored during session;Limited activity within patient's tolerance;Repositioned;Patient requesting pain meds-RN notified         Frequency  Min 2X/week        Progress Toward Goals  OT Goals(current goals can now be found in the care plan section)  Progress towards OT goals: Progressing toward goals     Plan Discharge plan remains appropriate       AM-PAC OT "6 Clicks" Daily Activity     Outcome Measure   Help from another person eating meals?: None Help from another person taking care of personal grooming?: A Little Help from another person toileting, which includes using toliet, bedpan, or urinal?: A Little Help from another person bathing (including washing, rinsing, drying)?: A Little Help from another person to put on and taking off regular upper body clothing?: A Little Help from another person to put on and taking off regular lower body clothing?: A Lot 6 Click Score: 18    End of Session    OT Visit Diagnosis: Unsteadiness on feet (R26.81);Pain;Muscle weakness (generalized) (M62.81);Other abnormalities of gait and mobility (R26.89);Other symptoms and signs involving the nervous system (R29.898) Pain - part of body:  (neck - incisional)   Activity Tolerance Patient tolerated treatment well   Patient Left in bed;with call bell/phone within reach   Nurse Communication Mobility status;Patient requests pain meds        Time: 1403-1430 OT Time Calculation (min): 27 min  Charges: OT General Charges $OT Visit: 1 Visit OT Treatments $Self Care/Home Management : 23-37 mins  Saxton Chain/OTS  Clothilde Tippetts 09/05/2019, 2:54 PM

## 2019-09-06 LAB — HEPATIC FUNCTION PANEL
ALT: 456 U/L — ABNORMAL HIGH (ref 0–44)
AST: 483 U/L — ABNORMAL HIGH (ref 15–41)
Albumin: 3 g/dL — ABNORMAL LOW (ref 3.5–5.0)
Alkaline Phosphatase: 326 U/L — ABNORMAL HIGH (ref 38–126)
Bilirubin, Direct: 0.6 mg/dL — ABNORMAL HIGH (ref 0.0–0.2)
Indirect Bilirubin: 0.6 mg/dL (ref 0.3–0.9)
Total Bilirubin: 1.2 mg/dL (ref 0.3–1.2)
Total Protein: 6.9 g/dL (ref 6.5–8.1)

## 2019-09-06 LAB — PROTIME-INR
INR: 1.1 (ref 0.8–1.2)
Prothrombin Time: 13.8 seconds (ref 11.4–15.2)

## 2019-09-06 LAB — IRON AND TIBC
Iron: 84 ug/dL (ref 28–170)
Saturation Ratios: 20 % (ref 10.4–31.8)
TIBC: 421 ug/dL (ref 250–450)
UIBC: 337 ug/dL

## 2019-09-06 LAB — FERRITIN: Ferritin: 275 ng/mL (ref 11–307)

## 2019-09-06 MED ORDER — KETOROLAC TROMETHAMINE 30 MG/ML IJ SOLN: 30.0000 mg | Freq: Once | INTRAMUSCULAR | Status: DC

## 2019-09-06 MED ORDER — IBUPROFEN 200 MG PO TABS
400.0000 mg | ORAL_TABLET | Freq: Once | ORAL | Status: AC
  Administered 2019-09-06: 400 mg via ORAL
  Filled 2019-09-06: qty 2

## 2019-09-06 MED ORDER — HEPARIN SODIUM (PORCINE) 5000 UNIT/ML IJ SOLN
5000.0000 [IU] | Freq: Three times a day (TID) | INTRAMUSCULAR | Status: DC
  Filled 2019-09-06 (×3): qty 1

## 2019-09-06 NOTE — Progress Notes (Signed)
PROGRESS NOTE    Monica Eaton  SUP:[id number] DOB: [birth year] DOA: 08/07/2019 PCP: Patient, No Pcp Per   Brief Narrative:  Patient is a 44 year old female with history of IV drug abuse, tobacco abuse who presents with severe neck pain and upper back pain.  In the emergency department she was found to have leukocytosis, elevated CRP.  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  Neurosurgery was consulted and she has required surgical intervention on 08/08/2019.  She was found to have MSSA bacteremia and started on IV antibiotics.  Antibiotics changed to doxycycline due   to progressive elevated liver function test.Suspicion for DILI.  Plan for discharge after improvement in the LFTs.   Assessment & Plan:   Principal Problem:   Discitis of cervical region Active Problems:   Cervical pain (neck)   IVDU (intravenous drug user)   Tobacco abuse   Neck pain   Bandemia   MSSA bacteremia   Epidural abscess   Hardware complicating wound infection ([facility])   Malnutrition of moderate degree   LFT elevation   Chronic hepatitis C with hepatic coma (HCC)   Hepatitis   MSSA cervical discitis/osteomyelitis: Status post C3-7 laminectomy , C3-6 PSIF.  Neurosurgery was following.  She will follow-up with neurosurgery as an outpatient.  MSSA bacteremia: She was on cefazolin.  Plan was to continue 4 to 5 weeks of IV cefazolin as an inpatient and switch to oral therapy.  Rifampin has been stopped due to elevated liver enzymes.  2D echo did not show any vegetations.  She needs long-term oral antibiotics given placement of hardware.  ID recalled earlier because of progressive worsening of liver function.  Stopped cefazolin and started on doxycycline. Echo repeated without finding of any vegetation.  She will follow-up with ID as an outpatient.  IV drug use: Counseled for cessation.  HIV, hepatitis B and hepatitis C  negative.  UDS was positive for benzodiazepines, opiates, THC.  Patient is also a smoker.  Continue nicotine patch. We offered drug rehabilitation and also follow-up with pain management for suboxone  but patient declined.  Elevated LFTs: High suspicion for DILI  secondary to cefazolin. liver ultrasound unremarkable.    Continue to monitor LFTs.  Hepatitis panel negative twice.Check daily LFTs.  She does not have any right upper quadrant tenderness or hepatomegaly. GI folllowing and ordering serological panel to rule out chronic liver disease.Lovenox D/Ced  Neck pain/back pain: On oxycodone, Flexeril.  Continue supportive care  Normocytic anemia: Currently hemoglobin stable.  Hypomagnesemia: Supplemented  Nutrition Problem: Moderate Malnutrition Etiology: social / environmental circumstances      DVT prophylaxis: Lovenox Code Status: Full code Family Communication: Discussed with father on phone on 09/03/2019 Status is: Inpatient  Remains inpatient appropriate because:IV treatments appropriate due to intensity of illness or inability to take PO   Dispo: The patient is from: Home              Anticipated d/c is to: Home              Anticipated d/c date is:1-2 days              Patient currently is not medically stable to d/c.  Needs  improvement in the liver function before DC    Consultants: Neuro surgery, ID  Procedures: Cervical laminectomy and fusion  Antimicrobials:  Anti-infectives (From admission, onward)   Start     Dose/Rate Route Frequency Ordered  Stop   09/03/19 2200  doxycycline (VIBRA-TABS) tablet 100 mg     Discontinue     100 mg Oral Every 12 hours 09/03/19 1445     08/12/19 1700  rifampin (RIFADIN) capsule 300 mg  Status:  Discontinued        300 mg Oral 2 times daily with meals 08/12/19 1353 08/28/19 1415   08/11/19 1400  ceFAZolin (ANCEF) IVPB 2g/100 mL premix  Status:  Discontinued        2 g 200 mL/hr over 30 Minutes Intravenous Every 8 hours 08/11/19  1159 09/03/19 1445   08/10/19 1400  vancomycin (VANCOREADY) IVPB 750 mg/150 mL  Status:  Discontinued        750 mg 150 mL/hr over 60 Minutes Intravenous Every 8 hours 08/10/19 1254 08/11/19 1159   08/08/19 2042  bacitracin 50,000 Units in sodium chloride 0.9 % 500 mL irrigation  Status:  Discontinued          As needed 08/08/19 2043 08/08/19 2350   08/08/19 1800  vancomycin (VANCOCIN) IVPB 1000 mg/200 mL premix  Status:  Discontinued        1,000 mg 200 mL/hr over 60 Minutes Intravenous Every 12 hours 08/08/19 0644 08/10/19 0919   08/08/19 0800  levofloxacin (LEVAQUIN) IVPB 750 mg  Status:  Discontinued        750 mg 100 mL/hr over 90 Minutes Intravenous Every 24 hours 08/08/19 0644 08/09/19 0115   08/08/19 0645  vancomycin (VANCOREADY) IVPB 1250 mg/250 mL        1,250 mg 166.7 mL/hr over 90 Minutes Intravenous  Once 08/08/19 3664 08/08/19 0916      Subjective: Patient seen and examined at the bedside today.  Hemodynamically stable.  Comfortable.  As always, complains of pain   objective: Vitals:   09/05/19 1942 09/05/19 2319 09/06/19 0334 09/06/19 0743  BP: (!) 137/94 (!) 138/90 123/81 (!) 141/91  Pulse: 85 82 88 80  Resp: 16 17 17 19   Temp: 98.3 F (36.8 C) 98.5 F (36.9 C) 98 F (36.7 C) 97.8 F (36.6 C)  TempSrc: Oral Oral Oral Oral  SpO2: 98% 100% 96% 98%  Weight:      Height:        Intake/Output Summary (Last 24 hours) at 09/06/2019 0831 Last data filed at 09/06/2019 4034 Gross per 24 hour  Intake 594 ml  Output --  Net 594 ml   Filed Weights   08/07/19 2205 08/08/19 1253 08/08/19 1823  Weight: 59 kg 59 kg 61.2 kg    Examination:  General exam: Appears calm and comfortable ,Not in distress,average built HEENT:PERRL,Oral mucosa moist, Ear/Nose normal on gross exam, clean surgical wound on the back of the neck Respiratory system: Bilateral equal air entry, normal vesicular breath sounds, no wheezes or crackles  Cardiovascular system: S1 & S2 heard, RRR. No  JVD, murmurs, rubs, gallops or clicks. Gastrointestinal system: Abdomen is nondistended, soft and nontender. No organomegaly or masses felt. Normal bowel sounds heard. Central nervous system: Alert and oriented. No focal neurological deficits. Extremities: No edema, no clubbing ,no cyanosis Skin: No rashes, lesions or ulcers,no icterus ,no pallor   data Reviewed: I have personally reviewed following labs and imaging studies  CBC: Recent Labs  Lab 09/01/19 0316 09/05/19 0436  WBC 4.5 3.6*  NEUTROABS 2.0 1.2*  HGB 10.6* 10.6*  HCT 33.0* 32.7*  MCV 92.7 91.9  PLT 377 742   Basic Metabolic Panel: Recent Labs  Lab 09/01/19 0316 09/02/19 0617 09/03/19 0313  09/05/19 0436  NA 137 136 138  --   K 4.2 4.4 4.1  --   CL 99 100 101  --   CO2 28 29 29   --   GLUCOSE 111* 107* 112*  --   BUN 6 <5* <5*  --   CREATININE 0.51 0.52 0.50  --   CALCIUM 9.1 8.9 9.0  --   MG 1.6* 1.7  --  1.4*   GFR: Estimated Creatinine Clearance: 88.5 mL/min (by C-G formula based on SCr of 0.5 mg/dL). Liver Function Tests: Recent Labs  Lab 09/02/19 0617 09/03/19 0313 09/04/19 0924 09/05/19 0436 09/06/19 0636  AST 390* 421* 451* 431* 483*  ALT 245* 271* 305* 347* 456*  ALKPHOS 245* 281* 283* 302* 326*  BILITOT 0.3 0.5 0.6 1.0 1.2  PROT 6.0* 6.4* 5.9* 6.1* 6.9  ALBUMIN 2.6* 2.7* 2.5* 2.6* 3.0*   No results for input(s): LIPASE, AMYLASE in the last 168 hours. No results for input(s): AMMONIA in the last 168 hours. Coagulation Profile: Recent Labs  Lab 09/06/19 0636  INR 1.1   Cardiac Enzymes: No results for input(s): CKTOTAL, CKMB, CKMBINDEX, TROPONINI in the last 168 hours. BNP (last 3 results) No results for input(s): PROBNP in the last 8760 hours. HbA1C: No results for input(s): HGBA1C in the last 72 hours. CBG: No results for input(s): GLUCAP in the last 168 hours. Lipid Profile: No results for input(s): CHOL, HDL, LDLCALC, TRIG, CHOLHDL, LDLDIRECT in the last 72 hours. Thyroid  Function Tests: No results for input(s): TSH, T4TOTAL, FREET4, T3FREE, THYROIDAB in the last 72 hours. Anemia Panel: No results for input(s): VITAMINB12, FOLATE, FERRITIN, TIBC, IRON, RETICCTPCT in the last 72 hours. Sepsis Labs: No results for input(s): PROCALCITON, LATICACIDVEN in the last 168 hours.  No results found for this or any previous visit (from the past 240 hour(s)).       Radiology Studies: ECHOCARDIOGRAM COMPLETE  Result Date: 09/04/2019    ECHOCARDIOGRAM REPORT   Patient Name:   Monica Eaton Date of Exam: 09/04/2019 Medical Rec #:  [id number]         Height:       67.0 in Accession #:    [id number]        Weight:       134.9 lb Date of Birth:  [birth year]         BSA:          20.711 m Patient Age:    44 years          BP:           137/90 mmHg Patient Gender: F                 HR:           87 bpm. Exam Location:  Inpatient Procedure: 2D Echo, Cardiac Doppler and Color Doppler Indications:    Endocarditis  History:        Patient has prior history of Echocardiogram examinations, most                 recent 08/09/2019. Signs/Symptoms:Bacteremia; Risk Factors:Current                 Smoker. IVDU.  Sonographer:    Roseanna Rainbow RDCS Referring Phys: [id number] CORNELIUS N VAN DAM  Sonographer Comments: Technically difficult study due to poor echo windows. IMPRESSIONS  1. Left ventricular ejection fraction, by estimation, is 55 to 60%. The left ventricle has normal function. The left ventricle has  no regional wall motion abnormalities. There is mild left ventricular hypertrophy. Left ventricular diastolic parameters were normal.  2. Right ventricular systolic function is normal. The right ventricular size is normal. Tricuspid regurgitation signal is inadequate for assessing PA pressure.  3. The mitral valve is normal in structure. Trivial mitral valve regurgitation.  4. The aortic valve is tricuspid. Aortic valve regurgitation is not visualized. No aortic stenosis is present.  5. The inferior vena  cava is dilated in size with <50% respiratory variability, suggesting right atrial pressure of 15 mmHg. Conclusion(s)/Recommendation(s): No vegetation seen. If high clinical suspicion for endocarditis, consider TEE. FINDINGS  Left Ventricle: Left ventricular ejection fraction, by estimation, is 55 to 60%. The left ventricle has normal function. The left ventricle has no regional wall motion abnormalities. The left ventricular internal cavity size was normal in size. There is  mild left ventricular hypertrophy. Left ventricular diastolic parameters were normal. Right Ventricle: The right ventricular size is normal. Right vetricular wall thickness was not assessed. Right ventricular systolic function is normal. Tricuspid regurgitation signal is inadequate for assessing PA pressure. Left Atrium: Left atrial size was normal in size. Right Atrium: Right atrial size was normal in size. Pericardium: There is no evidence of pericardial effusion. Mitral Valve: The mitral valve is normal in structure. Trivial mitral valve regurgitation. Tricuspid Valve: The tricuspid valve is normal in structure. Tricuspid valve regurgitation is trivial. Aortic Valve: The aortic valve is tricuspid. Aortic valve regurgitation is not visualized. No aortic stenosis is present. Pulmonic Valve: The pulmonic valve was not well visualized. Pulmonic valve regurgitation is not visualized. Aorta: The aortic root and ascending aorta are structurally normal, with no evidence of dilitation. Venous: The inferior vena cava is dilated in size with less than 50% respiratory variability, suggesting right atrial pressure of 15 mmHg. IAS/Shunts: No atrial level shunt detected by color flow Doppler.  LEFT VENTRICLE PLAX 2D LVIDd:         4.50 cm     Diastology LVIDs:         3.20 cm     LV e' lateral:   13.60 cm/s LV PW:         1.60 cm     LV E/e' lateral: 7.2 LV IVS:        1.10 cm     LV e' medial:    9.46 cm/s LVOT diam:     1.90 cm     LV E/e' medial:  10.3  LV SV:         43 LV SV Index:   25 LVOT Area:     2.84 cm  LV Volumes (MOD) LV vol d, MOD A2C: 72.3 ml LV vol d, MOD A4C: 69.4 ml LV vol s, MOD A2C: 30.4 ml LV vol s, MOD A4C: 30.3 ml LV SV MOD A2C:     41.9 ml LV SV MOD A4C:     69.4 ml LV SV MOD BP:      40.4 ml RIGHT VENTRICLE            IVC RV S prime:     9.36 cm/s  IVC diam: 2.10 cm TAPSE (M-mode): 2.2 cm LEFT ATRIUM             Index       RIGHT ATRIUM           Index LA diam:        2.60 cm 1.52 cm/m  RA Area:     11.10 cm  LA Vol (A2C):   29.6 ml 17.30 ml/m RA Volume:   24.30 ml  14.20 ml/m LA Vol (A4C):   35.2 ml 20.58 ml/m LA Biplane Vol: 34.6 ml 20.23 ml/m  AORTIC VALVE LVOT Vmax:   80.30 cm/s LVOT Vmean:  55.800 cm/s LVOT VTI:    0.152 m  AORTA Ao Root diam: 3.20 cm Ao Asc diam:  3.20 cm MITRAL VALVE MV Area (PHT): 5.84 cm    SHUNTS MV Decel Time: 130 msec    Systemic VTI:  0.15 m MV E velocity: 97.30 cm/s  Systemic Diam: 1.90 cm MV A velocity: 53.10 cm/s MV E/A ratio:  1.83 Oswaldo Milian MD Electronically signed by Oswaldo Milian MD Signature Date/Time: 09/04/2019/10:37:48 PM    Final         Scheduled Meds: . doxycycline  100 mg Oral Q12H  . enoxaparin (LOVENOX) injection  40 mg Subcutaneous Q24H  . feeding supplement  1 Container Oral TID BM  . folic acid  1 mg Oral Daily  . magnesium oxide  800 mg Oral Daily  . multivitamin with minerals  1 tablet Oral Daily  . nicotine  14 mg Transdermal Daily  . senna-docusate  1 tablet Oral BID  . sodium chloride flush  10-40 mL Intracatheter Q12H  . thiamine  100 mg Oral Daily   Continuous Infusions: . sodium chloride Stopped (09/05/19 1010)  . magnesium sulfate bolus IVPB       LOS: 29 days    Time spent: 25 mins.More than 50% of that time was spent in counseling and/or coordination of care.      Shelly Coss, MD [facility] P7/31/2021, 8:31 AM

## 2019-09-06 NOTE — Progress Notes (Signed)
[facility] Progress Note  CC:  elevated LFTs, suspect DILI     Subjective:  Talkative today.  Tearful about her drug use and concern that she would try to use again to relieve her pain, etc.  Says that she discussed with hospitalist about going back on suboxone.  Says that she actually refused the lovenox last night because her abdomen is hurting from all of the bruising.  I helped her put her SCDs on while in her room.  Objective:  Vital signs in last 24 hours: Temp:  [97.8 F (36.6 C)-98.5 F (36.9 C)] 97.8 F (36.6 C) (07/31 0743) Pulse Rate:  [80-88] 80 (07/31 0743) Resp:  [16-19] 19 (07/31 0743) BP: (123-141)/(81-98) 141/91 (07/31 0743) SpO2:  [96 %-100 %] 98 % (07/31 0743) Last BM Date: 09/05/19 General:  Alert, Well-developed, in NAD Heart:  Regular rate and rhythm; no murmurs Pulm:  CTAB.  No increased WOB. Abdomen:  Soft, non-distended.  BS present.  Bruising noted on abdomen from lovenox injections but otherwise non-tender. Extremities:  Without edema. Neurologic:  Alert and oriented x 4;  grossly normal neurologically. Psych:  Alert and cooperative. Normal mood and affect.  Intake/Output from previous day: 07/30 0701 - 07/31 0700 In: 320 [P.O.:320] Out: -  Intake/Output this shift: Total I/O In: 274 [P.O.:274] Out: -   Lab Results: Recent Labs    09/05/19 0436  WBC 3.6*  HGB 10.6*  HCT 32.7*  PLT 296   LFT Recent Labs    09/06/19 0636  PROT 6.9  ALBUMIN 3.0*  AST 483*  ALT 456*  ALKPHOS 326*  BILITOT 1.2  BILIDIR 0.6*  IBILI 0.6   PT/INR Recent Labs    09/06/19 0636  LABPROT 13.8  INR 1.1   Hepatitis Panel Recent Labs    09/03/19 1506  HEPBSAG NON REACTIVE  HEPAIGM NON REACTIVE    ECHOCARDIOGRAM COMPLETE  Result Date: 09/04/2019    ECHOCARDIOGRAM REPORT   Patient Name:   Monica Eaton Date of Exam: 09/04/2019 Medical Rec #:  [id number]         Height:       67.0 in Accession #:    [id number]        Weight:        134.9 lb Date of Birth:  1975/07/11         BSA:          22.711 m Patient Age:    44 years          BP:           137/90 mmHg Patient Gender: F                 HR:           87 bpm. Exam Location:  Inpatient Procedure: 2D Echo, Cardiac Doppler and Color Doppler Indications:    Endocarditis  History:        Patient has prior history of Echocardiogram examinations, most                 recent 08/09/2019. Signs/Symptoms:Bacteremia; Risk Factors:Current                 Smoker. IVDU.  Sonographer:    Roseanna Rainbow RDCS Referring Phys: [id number] CORNELIUS N VAN DAM  Sonographer Comments: Technically difficult study due to poor echo windows. IMPRESSIONS  1. Left ventricular ejection fraction, by estimation, is 55 to 60%. The left ventricle has normal function. The left ventricle  has no regional wall motion abnormalities. There is mild left ventricular hypertrophy. Left ventricular diastolic parameters were normal.  2. Right ventricular systolic function is normal. The right ventricular size is normal. Tricuspid regurgitation signal is inadequate for assessing PA pressure.  3. The mitral valve is normal in structure. Trivial mitral valve regurgitation.  4. The aortic valve is tricuspid. Aortic valve regurgitation is not visualized. No aortic stenosis is present.  5. The inferior vena cava is dilated in size with <50% respiratory variability, suggesting right atrial pressure of 15 mmHg. Conclusion(s)/Recommendation(s): No vegetation seen. If high clinical suspicion for endocarditis, consider TEE. FINDINGS  Left Ventricle: Left ventricular ejection fraction, by estimation, is 55 to 60%. The left ventricle has normal function. The left ventricle has no regional wall motion abnormalities. The left ventricular internal cavity size was normal in size. There is  mild left ventricular hypertrophy. Left ventricular diastolic parameters were normal. Right Ventricle: The right ventricular size is normal. Right vetricular wall thickness was not  assessed. Right ventricular systolic function is normal. Tricuspid regurgitation signal is inadequate for assessing PA pressure. Left Atrium: Left atrial size was normal in size. Right Atrium: Right atrial size was normal in size. Pericardium: There is no evidence of pericardial effusion. Mitral Valve: The mitral valve is normal in structure. Trivial mitral valve regurgitation. Tricuspid Valve: The tricuspid valve is normal in structure. Tricuspid valve regurgitation is trivial. Aortic Valve: The aortic valve is tricuspid. Aortic valve regurgitation is not visualized. No aortic stenosis is present. Pulmonic Valve: The pulmonic valve was not well visualized. Pulmonic valve regurgitation is not visualized. Aorta: The aortic root and ascending aorta are structurally normal, with no evidence of dilitation. Venous: The inferior vena cava is dilated in size with less than 50% respiratory variability, suggesting right atrial pressure of 15 mmHg. IAS/Shunts: No atrial level shunt detected by color flow Doppler.  LEFT VENTRICLE PLAX 2D LVIDd:         4.50 cm     Diastology LVIDs:         3.20 cm     LV e' lateral:   13.60 cm/s LV PW:         1.60 cm     LV E/e' lateral: 7.2 LV IVS:        1.10 cm     LV e' medial:    9.46 cm/s LVOT diam:     1.90 cm     LV E/e' medial:  10.3 LV SV:         43 LV SV Index:   25 LVOT Area:     2.84 cm  LV Volumes (MOD) LV vol d, MOD A2C: 72.3 ml LV vol d, MOD A4C: 69.4 ml LV vol s, MOD A2C: 30.4 ml LV vol s, MOD A4C: 30.3 ml LV SV MOD A2C:     41.9 ml LV SV MOD A4C:     69.4 ml LV SV MOD BP:      40.4 ml RIGHT VENTRICLE            IVC RV S prime:     9.36 cm/s  IVC diam: 2.10 cm TAPSE (M-mode): 2.2 cm LEFT ATRIUM             Index       RIGHT ATRIUM           Index LA diam:        2.60 cm 1.52 cm/m  RA Area:     11.10 cm  LA Vol (A2C):   29.6 ml 17.30 ml/m RA Volume:   24.30 ml  14.20 ml/m LA Vol (A4C):   35.2 ml 20.58 ml/m LA Biplane Vol: 34.6 ml 20.23 ml/m  AORTIC VALVE LVOT Vmax:    80.30 cm/s LVOT Vmean:  55.800 cm/s LVOT VTI:    0.152 m  AORTA Ao Root diam: 3.20 cm Ao Asc diam:  3.20 cm MITRAL VALVE MV Area (PHT): 5.84 cm    SHUNTS MV Decel Time: 130 msec    Systemic VTI:  0.15 m MV E velocity: 97.30 cm/s  Systemic Diam: 1.90 cm MV A velocity: 53.10 cm/s MV E/A ratio:  1.83 Oswaldo Milian MD Electronically signed by Oswaldo Milian MD Signature Date/Time: 09/04/2019/10:37:48 PM    Final    Assessment / Plan: *Elevated LFTs.  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    current abx is doxycycline for c spine discitis, MSSA bacteremia.  LFTs are continuing to rise. INR 1.1 today.  Hepatitis serologies non-reactive.   HCV quant in process.   HIV non-reative, HIV RNA unable to calculate/<20.  Once again, lovenox has been documented to cause hepatotoxicity.  The hospitalist is agreeable to switch to SQ heparin instead.  Will see how she does with that change (if she will take it TID).   -Continue to monitor LFTs. -I am going to run the full serologic panel to rule out other causes of chronic liver disease that could have become unveiled.     LOS: 29 days   Laban Emperor. Shamiah Kahler  09/06/2019, 10:57 AM

## 2019-09-06 NOTE — Plan of Care (Signed)

## 2019-09-07 LAB — HEPATIC FUNCTION PANEL
ALT: 378 U/L — ABNORMAL HIGH (ref 0–44)
AST: 316 U/L — ABNORMAL HIGH (ref 15–41)
Albumin: 2.8 g/dL — ABNORMAL LOW (ref 3.5–5.0)
Alkaline Phosphatase: 337 U/L — ABNORMAL HIGH (ref 38–126)
Bilirubin, Direct: 0.3 mg/dL — ABNORMAL HIGH (ref 0.0–0.2)
Indirect Bilirubin: 0.7 mg/dL (ref 0.3–0.9)
Total Bilirubin: 1 mg/dL (ref 0.3–1.2)
Total Protein: 6.5 g/dL (ref 6.5–8.1)

## 2019-09-07 LAB — CERULOPLASMIN: Ceruloplasmin: 36.7 mg/dL (ref 19.0–39.0)

## 2019-09-07 LAB — IGG: IgG (Immunoglobin G), Serum: 1346 mg/dL (ref 586–1602)

## 2019-09-07 LAB — ALPHA-1-ANTITRYPSIN: A-1 Antitrypsin, Ser: 145 mg/dL (ref 101–187)

## 2019-09-07 LAB — TISSUE TRANSGLUTAMINASE, IGA: Tissue Transglutaminase Ab, IgA: <2 U/mL (ref 0–3)

## 2019-09-07 LAB — IGA: IgA: 472 mg/dL — ABNORMAL HIGH (ref 87–352)

## 2019-09-07 MED ORDER — IBUPROFEN 200 MG PO TABS
400.0000 mg | ORAL_TABLET | Freq: Four times a day (QID) | ORAL | Status: DC | PRN
  Administered 2019-09-07 – 2019-09-08 (×2): 400 mg via ORAL
  Filled 2019-09-07 (×2): qty 2

## 2019-09-07 NOTE — Plan of Care (Signed)
  Problem: Education: Goal: Knowledge of General Education information will improve Description Including pain rating scale, medication(s)/side effects and non-pharmacologic comfort measures Outcome: Progressing   

## 2019-09-07 NOTE — Plan of Care (Signed)
  Problem: Education: Goal: Knowledge of General Education information will improve Description: Including pain rating scale, medication(s)/side effects and non-pharmacologic comfort measures 09/07/2019 1456 by Caroll Rancher, RN Outcome: Progressing 09/07/2019 1450 by Caroll Rancher, RN Outcome: Progressing

## 2019-09-07 NOTE — Progress Notes (Signed)
PROGRESS NOTE    Monica Eaton  XTG:[id number] DOB: 11-Apr-1975 DOA: 08/07/2019 PCP: Patient, No Pcp Per   Brief Narrative:  Patient is a 44 year old female with history of IV drug abuse, tobacco abuse who presents with severe neck pain and upper back pain.  In the emergency department she was found to have leukocytosis, elevated CRP.  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  Neurosurgery was consulted and she has required surgical intervention on 08/08/2019.  She was found to have MSSA bacteremia and started on IV antibiotics.  Antibiotics changed to doxycycline due   to progressive elevated liver function test.Suspicion for DILI.  Plan for discharge after further improvement in the LFTs.   Assessment & Plan:   Principal Problem:   Discitis of cervical region Active Problems:   Cervical pain (neck)   IVDU (intravenous drug user)   Tobacco abuse   Neck pain   Bandemia   MSSA bacteremia   Epidural abscess   Hardware complicating wound infection ([place])   Malnutrition of moderate degree   LFT elevation   Chronic hepatitis C with hepatic coma (HCC)   Hepatitis   MSSA cervical discitis/osteomyelitis: Status post C3-7 laminectomy , C3-6 PSIF.  Neurosurgery was following.  She will follow-up with neurosurgery as an outpatient.  MSSA bacteremia: She was on cefazolin.  Plan was to continue 4 to 5 weeks of IV cefazolin as an inpatient and switch to oral therapy.  Rifampin has been stopped due to elevated liver enzymes.  2D echo did not show any vegetations.  She needs long-term oral antibiotics given placement of hardware.  ID recalled earlier because of progressive worsening of liver function.  Stopped cefazolin and started on doxycycline. Echo repeated without finding of any vegetation.  She will follow-up with ID as an outpatient.  IV drug use: Counseled for cessation.  HIV, hepatitis B and  hepatitis C negative.  UDS was positive for benzodiazepines, opiates, THC.  Patient is also a smoker.  Continue nicotine patch. We offered drug rehabilitation and also follow-up with pain management for suboxone  but patient declined.  Elevated LFTs: High suspicion for DILI  secondary to cefazolin/lovenox. liver ultrasound unremarkable.    LFTs finally trending down.Continue to monitor LFTs.  Hepatitis panel negative twice.  She does not have any right upper quadrant tenderness or hepatomegaly. GI folllowing and ordered serological panel to rule out chronic liver disease.Lovenox D/Ced  Neck pain/back pain: On oxycodone, Flexeril.  Continue supportive care  Normocytic anemia: Currently hemoglobin stable.  Hypomagnesemia: Supplemented  Nutrition Problem: Moderate Malnutrition Etiology: social / environmental circumstances      DVT prophylaxis: Lovenox Code Status: Full code Family Communication: Discussed with father on phone on 09/03/2019 Status is: Inpatient  Remains inpatient appropriate because:IV treatments appropriate due to intensity of illness or inability to take PO   Dispo: The patient is from: Home              Anticipated d/c is to: Home              Anticipated d/c date itomorrow              Patient currently is not medically stable to d/c.  Needs further  improvement in the liver function before DC    Consultants: Neuro surgery, ID  Procedures: Cervical laminectomy and fusion  Antimicrobials:  Anti-infectives (From admission, onward)   Start     Dose/Rate Route  Frequency Ordered Stop   09/03/19 2200  doxycycline (VIBRA-TABS) tablet 100 mg     Discontinue     100 mg Oral Every 12 hours 09/03/19 1445     08/12/19 1700  rifampin (RIFADIN) capsule 300 mg  Status:  Discontinued        300 mg Oral 2 times daily with meals 08/12/19 1353 08/28/19 1415   08/11/19 1400  ceFAZolin (ANCEF) IVPB 2g/100 mL premix  Status:  Discontinued        2 g 200 mL/hr over 30 Minutes  Intravenous Every 8 hours 08/11/19 1159 09/03/19 1445   08/10/19 1400  vancomycin (VANCOREADY) IVPB 750 mg/150 mL  Status:  Discontinued        750 mg 150 mL/hr over 60 Minutes Intravenous Every 8 hours 08/10/19 1254 08/11/19 1159   08/08/19 2042  bacitracin 50,000 Units in sodium chloride 0.9 % 500 mL irrigation  Status:  Discontinued          As needed 08/08/19 2043 08/08/19 2350   08/08/19 1800  vancomycin (VANCOCIN) IVPB 1000 mg/200 mL premix  Status:  Discontinued        1,000 mg 200 mL/hr over 60 Minutes Intravenous Every 12 hours 08/08/19 0644 08/10/19 0919   08/08/19 0800  levofloxacin (LEVAQUIN) IVPB 750 mg  Status:  Discontinued        750 mg 100 mL/hr over 90 Minutes Intravenous Every 24 hours 08/08/19 0644 08/09/19 0115   08/08/19 0645  vancomycin (VANCOREADY) IVPB 1250 mg/250 mL        1,250 mg 166.7 mL/hr over 90 Minutes Intravenous  Once 08/08/19 3825 08/08/19 0916      Subjective: Patient seen and examined the bedside today.  Hemodynamically stable.  Comfortable.  Pain well controlled today.  Denies any complaints  objective: Vitals:   09/06/19 1943 09/06/19 2317 09/07/19 0410 09/07/19 0823  BP: 108/79 107/75 109/67 117/66  Pulse: 96 84 82 89  Resp: 17 17 18 18   Temp: 98.3 F (36.8 C) 98.5 F (36.9 C) 97.8 F (36.6 C) 97.8 F (36.6 C)  TempSrc: Oral Oral Oral Oral  SpO2: 100% 99% 99% 99%  Weight:      Height:        Intake/Output Summary (Last 24 hours) at 09/07/2019 0839 Last data filed at 09/06/2019 1701 Gross per 24 hour  Intake 696 ml  Output --  Net 696 ml   Filed Weights   08/07/19 2205 08/08/19 1253 08/08/19 1823  Weight: 59 kg 59 kg 61.2 kg    Examination:  General exam: Appears calm and comfortable ,Not in distress,average built HEENT:PERRL,Oral mucosa moist, Ear/Nose normal on gross exam, clean surgical scar on the back of the neck Respiratory system: Bilateral equal air entry, normal vesicular breath sounds, no wheezes or crackles   Cardiovascular system: S1 & S2 heard, RRR. No JVD, murmurs, rubs, gallops or clicks. Gastrointestinal system: Abdomen is nondistended, soft and nontender. No organomegaly or masses felt. Normal bowel sounds heard. Central nervous system: Alert and oriented. No focal neurological deficits. Extremities: No edema, no clubbing ,no cyanosis     data Reviewed: I have personally reviewed following labs and imaging studies  CBC: Recent Labs  Lab 09/01/19 0316 09/05/19 0436  WBC 4.5 3.6*  NEUTROABS 2.0 1.2*  HGB 10.6* 10.6*  HCT 33.0* 32.7*  MCV 92.7 91.9  PLT 377 053   Basic Metabolic Panel: Recent Labs  Lab 09/01/19 0316 09/02/19 0617 09/03/19 0313 09/05/19 0436  NA 137 136 138  --  K 4.2 4.4 4.1  --   CL 99 100 101  --   CO2 28 29 29   --   GLUCOSE 111* 107* 112*  --   BUN 6 <5* <5*  --   CREATININE 0.51 0.52 0.50  --   CALCIUM 9.1 8.9 9.0  --   MG 1.6* 1.7  --  1.4*   GFR: Estimated Creatinine Clearance: 88.5 mL/min (by C-G formula based on SCr of 0.5 mg/dL). Liver Function Tests: Recent Labs  Lab 09/03/19 0313 09/04/19 0924 09/05/19 0436 09/06/19 0636 09/07/19 0222  AST 421* 451* 431* 483* 316*  ALT 271* 305* 347* 456* 378*  ALKPHOS 281* 283* 302* 326* 337*  BILITOT 0.5 0.6 1.0 1.2 1.0  PROT 6.4* 5.9* 6.1* 6.9 6.5  ALBUMIN 2.7* 2.5* 2.6* 3.0* 2.8*   No results for input(s): LIPASE, AMYLASE in the last 168 hours. No results for input(s): AMMONIA in the last 168 hours. Coagulation Profile: Recent Labs  Lab 09/06/19 0636  INR 1.1   Cardiac Enzymes: No results for input(s): CKTOTAL, CKMB, CKMBINDEX, TROPONINI in the last 168 hours. BNP (last 3 results) No results for input(s): PROBNP in the last 8760 hours. HbA1C: No results for input(s): HGBA1C in the last 72 hours. CBG: No results for input(s): GLUCAP in the last 168 hours. Lipid Profile: No results for input(s): CHOL, HDL, LDLCALC, TRIG, CHOLHDL, LDLDIRECT in the last 72 hours. Thyroid Function  Tests: No results for input(s): TSH, T4TOTAL, FREET4, T3FREE, THYROIDAB in the last 72 hours. Anemia Panel: Recent Labs    09/06/19 1228  FERRITIN 275  TIBC 421  IRON 84   Sepsis Labs: No results for input(s): PROCALCITON, LATICACIDVEN in the last 168 hours.  No results found for this or any previous visit (from the past 240 hour(s)).       Radiology Studies: No results found.      Scheduled Meds: . doxycycline  100 mg Oral Q12H  . feeding supplement  1 Container Oral TID BM  . folic acid  1 mg Oral Daily  . heparin injection (subcutaneous)  5,000 Units Subcutaneous Q8H  . magnesium oxide  800 mg Oral Daily  . multivitamin with minerals  1 tablet Oral Daily  . nicotine  14 mg Transdermal Daily  . senna-docusate  1 tablet Oral BID  . sodium chloride flush  10-40 mL Intracatheter Q12H  . thiamine  100 mg Oral Daily   Continuous Infusions: . sodium chloride Stopped (09/05/19 1010)  . magnesium sulfate bolus IVPB       LOS: 30 days    Time spent: 25 mins.More than 50% of that time was spent in counseling and/or coordination of care.      Shelly Coss, MD [facility] P8/02/2019, 8:39 AM

## 2019-09-07 NOTE — Progress Notes (Signed)
     [facility] Progress Note  CC:  Elevated LFTs, suspected DILI  Subjective:  Feels ok.  Says that the hospitalist was discussing sending her home today.  Objective:  Vital signs in last 24 hours: Temp:  [97.8 F (36.6 C)-99.1 F (37.3 C)] 97.8 F (36.6 C) (08/01 0823) Pulse Rate:  [82-103] 89 (08/01 0823) Resp:  [17-19] 18 (08/01 0823) BP: (107-124)/(66-87) 117/66 (08/01 0823) SpO2:  [98 %-100 %] 99 % (08/01 0823) Last BM Date: 09/06/19 General:  Alert, Well-developed, in NAD Heart:  Regular rate and rhythm; no murmurs Pulm:  CTAB.  No W/R/R. Abdomen:  Soft, non-distended.  BS present.  Non-tender. Extremities:  Without edema. Neurologic:  Alert and oriented x 4;  grossly normal neurologically. Psych:  Alert and cooperative. Normal mood and affect.  Intake/Output from previous day: 07/31 0701 - 08/01 0700 In: 26 [P.O.:970] Out: -   Lab Results: Recent Labs    09/05/19 0436  WBC 3.6*  HGB 10.6*  HCT 32.7*  PLT 296   LFT Recent Labs    09/07/19 0222  PROT 6.5  ALBUMIN 2.8*  AST 316*  ALT 378*  ALKPHOS 337*  BILITOT 1.0  BILIDIR 0.3*  IBILI 0.7   PT/INR Recent Labs    09/06/19 0636  LABPROT 13.8  INR 1.1   Assessment / Plan: *Elevated LFTs. Initially suspected DILI due to Cefazolin.  current abx is doxycycline for c spine discitis, MSSA bacteremia.  LFTs continued to rise so then lovenox was discontinued on 7/31.  LFTs are down some today.  INR 1.1 7/31.  Hepatitis serologies non-reactive. HCV quant in process. HIV non-reative, HIV RNA unable to calculate/<20.  Full serologic panel to rule out other causes of chronic liver disease that could have become unveiled were ordered, most are still pending but what is back is negative/normal. -They were apparently discussing discharge today.  I think it might be a good idea to keep her one more day and make sure LFTs are continuing to trend down in the morning.  If down further then ok  for discharge in AM.    LOS: 30 days   Laban Emperor. Waris Rodger  09/07/2019, 9:00 AM

## 2019-09-08 LAB — HEPATIC FUNCTION PANEL
ALT: 355 U/L — ABNORMAL HIGH (ref 0–44)
AST: 279 U/L — ABNORMAL HIGH (ref 15–41)
Albumin: 2.9 g/dL — ABNORMAL LOW (ref 3.5–5.0)
Alkaline Phosphatase: 306 U/L — ABNORMAL HIGH (ref 38–126)
Bilirubin, Direct: 0.2 mg/dL (ref 0.0–0.2)
Indirect Bilirubin: 0.5 mg/dL (ref 0.3–0.9)
Total Bilirubin: 0.7 mg/dL (ref 0.3–1.2)
Total Protein: 6.3 g/dL — ABNORMAL LOW (ref 6.5–8.1)

## 2019-09-08 LAB — HCV RNA (INTERNATIONAL UNITS)
HCV log10: 7.199 log10 IU/mL
Hcv Rna (International Units): 15800000 IU/mL

## 2019-09-08 LAB — ANTI-SMOOTH MUSCLE ANTIBODY, IGG: F-Actin IgG: 8 Units (ref 0–19)

## 2019-09-08 LAB — ANTINUCLEAR ANTIBODIES, IFA: ANA Ab, IFA: NEGATIVE

## 2019-09-08 LAB — MAGNESIUM: Magnesium: 1.8 mg/dL (ref 1.7–2.4)

## 2019-09-08 LAB — HCV RNA QUANT RFLX ULTRA OR GENOTYP

## 2019-09-08 LAB — HEPATITIS C GENOTYPE

## 2019-09-08 LAB — MITOCHONDRIAL ANTIBODIES: Mitochondrial M2 Ab, IgG: <20 Units (ref 0.0–20.0)

## 2019-09-08 MED ORDER — THIAMINE HCL 100 MG PO TABS: 100.0000 mg | ORAL_TABLET | Freq: Every day | ORAL | 1 refills | Status: DC

## 2019-09-08 MED ORDER — BUPRENORPHINE HCL-NALOXONE HCL 8-2 MG SL SUBL: 1.0000 | SUBLINGUAL_TABLET | Freq: Every day | SUBLINGUAL | 0 refills | Status: DC

## 2019-09-08 MED ORDER — IBUPROFEN 400 MG PO TABS: 400.0000 mg | ORAL_TABLET | Freq: Four times a day (QID) | ORAL | 1 refills | Status: DC | PRN

## 2019-09-08 MED ORDER — NICOTINE 14 MG/24HR TD PT24: 14.0000 mg | MEDICATED_PATCH | Freq: Every day | TRANSDERMAL | 0 refills | Status: DC

## 2019-09-08 MED ORDER — MAGNESIUM OXIDE 400 (241.3 MG) MG PO TABS: 800.0000 mg | ORAL_TABLET | Freq: Every day | ORAL | 1 refills | Status: DC

## 2019-09-08 MED ORDER — BUPRENORPHINE HCL-NALOXONE HCL 8-2 MG SL SUBL
1.0000 | SUBLINGUAL_TABLET | Freq: Every day | SUBLINGUAL | Status: AC
  Administered 2019-09-08: 1 via SUBLINGUAL
  Filled 2019-09-08: qty 1

## 2019-09-08 MED ORDER — BUPRENORPHINE HCL-NALOXONE HCL 8-2 MG SL SUBL: 1.0000 | SUBLINGUAL_TABLET | Freq: Every day | SUBLINGUAL | 0 refills | Status: AC

## 2019-09-08 MED ORDER — FOLIC ACID 1 MG PO TABS: 1.0000 mg | ORAL_TABLET | Freq: Every day | ORAL | 1 refills | Status: DC

## 2019-09-08 MED ORDER — DOXYCYCLINE MONOHYDRATE 100 MG PO TABS: 100.0000 mg | ORAL_TABLET | Freq: Two times a day (BID) | ORAL | 11 refills | Status: DC

## 2019-09-08 MED FILL — DOXYCYCLINE HYCLATE 100 MG: 100 | 15 days supply | Qty: 30 | Fill #0

## 2019-09-08 MED FILL — FOLIC ACID 1 MG TABS: 1 | 30 days supply | Qty: 30 | Fill #0

## 2019-09-08 MED FILL — MAGNESIUM OXIDE 400 MG TABS: 400 | 15 days supply | Qty: 30 | Fill #0

## 2019-09-08 MED FILL — BUPREN-NALOX SL TAB 8-2MG: 8-2 | 2 days supply | Qty: 2 | Fill #0

## 2019-09-08 MED FILL — IBUPROFEN 400 MG TABS: 400 | 8 days supply | Qty: 30 | Fill #0

## 2019-09-08 MED FILL — NICOTINE 14 MG/24HR PATCH: 14 | 28 days supply | Qty: 28 | Fill #0

## 2019-09-08 MED FILL — VITAMIN B-1 100 MG TABS: 100 | 30 days supply | Qty: 30 | Fill #0

## 2019-09-08 NOTE — Progress Notes (Signed)
[name]  Date of Admission:  08/07/2019             ASSESSMENT:  Monica Eaton liver function tests are improving slowly. Tolerating doxycycline with no significant adverse side effects. Reviewed side effects of doxycycline and increase in sun sensitivity. Has appointment for Suboxone clinic upcoming on Wednesday. Discussed continuing need for doxycycline for extended duration given infection and presence of hardware. [facility] medications to the bedside prior to discharge. Follow up arranged in ID office.   PLAN:  1. Continue current dose of doxycyline 2. [facility] to deliver medications to bedside.  3. Continue to follow liver function outpatient. 4. Plan for follow up with ID clinic in September.   Principal Problem:   Discitis of cervical region Active Problems:   Cervical pain (neck)   IVDU (intravenous drug user)   Tobacco abuse   Neck pain   Bandemia   MSSA bacteremia   Epidural abscess   Hardware complicating wound infection ([place])   Malnutrition of moderate degree   LFT elevation   Chronic hepatitis C with hepatic coma (HCC)   Hepatitis   . doxycycline  100 mg Oral Q12H  . feeding supplement  1 Container Oral TID BM  . folic acid  1 mg Oral Daily  . heparin injection (subcutaneous)  5,000 Units Subcutaneous Q8H  . magnesium oxide  800 mg Oral Daily  . multivitamin with minerals  1 tablet Oral Daily  . nicotine  14 mg Transdermal Daily  . senna-docusate  1 tablet Oral BID  . sodium chloride flush  10-40 mL Intracatheter Q12H  . thiamine  100 mg Oral Daily    SUBJECTIVE:  Afebrile overnight with no acute events. Getting ready for discharge today.   Allergies  Allergen Reactions  . Gabapentin Other (See Comments)    Causes extreme agitation.   . Cefaclor   . Cephalosporins   . Nitrofurantoin   . Penicillins     Spoke with patient's family. They report that [name] had  a rash/hives when she was 42-42 years old. She did not have any shortness of breath and they do not recall having to take her to the doctor's office or hospital to treat the reaction.      Review of Systems: Review of Systems  Constitutional: Negative for chills, fever and weight loss.  Respiratory: Negative for cough, shortness of breath and wheezing.   Cardiovascular: Negative for chest pain and leg swelling.  Gastrointestinal: Negative for abdominal pain, constipation, diarrhea, nausea and vomiting.  Skin: Negative for rash.      OBJECTIVE: Vitals:   09/07/19 2346 09/08/19 0339 09/08/19 0844 09/08/19 1210  BP: 102/69 101/68 (!) 109/90 114/81  Pulse: 86 75 100 (!) 110  Resp: 17 16 16 16   Temp: 98.1 F (36.7 C) 97.6 F (36.4 C) 98.4 F (36.9 C) 98.4 F (36.9 C)  TempSrc: Oral Oral Oral Oral  SpO2: 99% 99% 100% 100%  Weight:      Height:       Body mass index is 21.13 kg/m.  Physical Exam Constitutional:      General: She is not in acute distress.    Appearance: She is well-developed.  Cardiovascular:     Rate and Rhythm: Normal rate and regular rhythm.     Heart sounds: Normal heart sounds.  Pulmonary:     Effort: Pulmonary effort is normal.     Breath sounds:  Normal breath sounds.  Skin:    General: Skin is warm and dry.  Neurological:     Mental Status: She is alert and oriented to person, place, and time.  Psychiatric:        Behavior: Behavior normal.        Thought Content: Thought content normal.        Judgment: Judgment normal.     Lab Results Lab Results  Component Value Date   WBC 3.6 (L) 09/05/2019   HGB 10.6 (L) 09/05/2019   HCT 32.7 (L) 09/05/2019   MCV 91.9 09/05/2019   PLT 296 09/05/2019    Lab Results  Component Value Date   CREATININE 0.50 09/03/2019   BUN <5 (L) 09/03/2019   NA 138 09/03/2019   K 4.1 09/03/2019   CL 101 09/03/2019   CO2 29 09/03/2019    Lab Results  Component Value Date   ALT 355 (H) 09/08/2019   AST 279 (H)  09/08/2019   ALKPHOS 306 (H) 09/08/2019   BILITOT 0.7 09/08/2019     Microbiology: No results found for this or any previous visit (from the past 240 hour(s)).   Terri Piedra, NP [facility]  09/08/2019  12:38 PM

## 2019-09-08 NOTE — Progress Notes (Signed)
Occupational Therapy Treatment Patient Details Name: Monica Eaton MRN: [id number] DOB: May 09, 1975 Today's Date: 09/08/2019    History of present illness Ayanah Snader is a 44 y.o. female with medical history significant for tobacco abuse and IV drug abuse who presents to the emergency department due to sudden onset of upper back pain which started about 3 -4 hours PTA.  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 Pt underwent, s/p C3-6 OLaminectomy & fusion. Pt is remaining hospitalized for IV antibiotics.   OT comments  Upon arrival, pt sitting up at [facility] eager and agreeable to skilled-OT session. Education was given on using adaptive equipment (long handled sponge, reacher, and sock aid) for LB ADLs. Pt completed donning shorts and socks with supervision in sitting demonstrating good recall of use. Pt asking questions about driving and bathing at home. Therapist ensured pt to listen to her body but remain active - trying to get up and move every hour to not get stiff. Pt became emotionally liable during session and expressed her anxieties about going home and potentially relapsing due to pain. Pt then discussed potentially getting more rehab outside of the hospital due to sensation abnormalities in hands making her unable to fasten fasteners on clothing and open containers. Therapist educated pt on compensatory techniques and suggested outpatient OT if covered by insurance. Believe pt would benefit from skilled OT services acutely and at the outpatient level to increase functional use in hands and provide a supportive social support to be successful in ADLs and IADLs.    Follow Up Recommendations  Outpatient OT    Equipment Recommendations  None recommended by OT       Precautions / Restrictions Precautions Precautions: Cervical;Fall Restrictions Weight Bearing Restrictions: No       Mobility Bed Mobility              Sit to sidelying: Supervision General bed mobility comments: pt sitting up EOB upon arrival and needing supervision for sit to supine  Transfers       Sit to Stand: Supervision         General transfer comment: supervision for sit<>stand with LB dressing    Balance Overall balance assessment: Needs assistance Sitting-balance support: No upper extremity supported Sitting balance-Leahy Scale: Good Sitting balance - Comments: able to sit and complete LB dressing with AE   Standing balance support: During functional activity Standing balance-Leahy Scale: Good Standing balance comment: pt able to obtain shorts from dresser without LOB                           ADL either performed or assessed with clinical judgement   ADL               Lower Body Bathing: Supervison/ safety;With adaptive equipment;Cueing for safety;Cueing for sequencing;Sitting/lateral leans Lower Body Bathing Details (indicate cue type and reason): educated on using AE for LB dressing     Lower Body Dressing: Supervision/safety;Sitting/lateral leans;Sit to/from stand;Cueing for sequencing;With adaptive equipment Lower Body Dressing Details (indicate cue type and reason): educated on AE for LB dressing               General ADL Comments: Pt feels comfortable and safer using AE for LB ADLs               Cognition Arousal/Alertness: Awake/alert Behavior During Therapy: Anxious Overall Cognitive Status: Impaired/Different from baseline Area of Impairment: Problem solving;Safety/judgement  Safety/Judgement: Decreased awareness of safety   Problem Solving: Difficulty sequencing;Requires verbal cues;Requires tactile cues General Comments: Pt with increased anxiety about going home. Pt able to follow commands with using AE for LB ADLs.               General Comments Pt expressed her worries about going home. Therapist encouraged pt to stay active  while listening to her body as well. Pt asked about outpatient therapy and discussed options with OT. Pt expresses she is afraid of relapsing and does not want to let everyone and herself down.    Pertinent Vitals/ Pain       Pain Assessment: Faces Faces Pain Scale: Hurts a little bit Pain Location: neck Pain Descriptors / Indicators: Aching;Guarding;Discomfort;Grimacing Pain Intervention(s): Monitored during session;Limited activity within patient's tolerance         Frequency  Min 2X/week        Progress Toward Goals  OT Goals(current goals can now be found in the care plan section)  Progress towards OT goals: Progressing toward goals  Acute Rehab OT Goals Patient Stated Goal: feel better OT Goal Formulation: With patient Time For Goal Achievement: 09/11/19 Potential to Achieve Goals: Good  Plan Discharge plan needs to be updated          End of Session Equipment Utilized During Treatment: Other (comment) (Hip kit)  OT Visit Diagnosis: Unsteadiness on feet (R26.81);Pain;Muscle weakness (generalized) (M62.81);Other abnormalities of gait and mobility (R26.89);Other symptoms and signs involving the nervous system (R29.898) Pain - part of body:  (neck)   Activity Tolerance Patient tolerated treatment well   Patient Left in bed;with call bell/phone within reach   Nurse Communication Mobility status        Time: 7017-7939 OT Time Calculation (min): 20 min  Charges: OT General Charges $OT Visit: 1 Visit OT Treatments $Self Care/Home Management : 8-22 mins  Danice Dippolito/OTS   Clova Morlock 09/08/2019, 10:38 AM

## 2019-09-08 NOTE — TOC Transition Note (Signed)
Transition of Care [facility]) - CM/SW Discharge Note   Patient Details  Name: Monica Eaton MRN: [id number] Date of Birth: 09-23-1975  [facility] CM/SW Contact:  Pollie Friar, RN Phone Number: 09/08/2019, 12:14 PM   Clinical Narrative:    Pt is discharging home with outpatient therapy. Pt agreeable to [employer]. Orders in Epic and information on the AVS. 3 in 1 to be delivered to the room per [employer].  Appt obtained for [facility] for Suboxone treatment. Information on the AVS.  Pt has needed transportation.    Final next level of care: OP Rehab Barriers to Discharge: No Barriers Identified   Patient Goals and CMS Choice     Choice offered to / list presented to : Patient  Discharge Placement                       Discharge Plan and Services                DME Arranged: 3-N-1 DME Agency: [employer] Date DME Agency Contacted: 09/08/19   Representative spoke with at DME Agency: Zack            Social Determinants of Health ([place]) Interventions     Readmission Risk Interventions No flowsheet data found.

## 2019-09-08 NOTE — Discharge Summary (Signed)
Physician Discharge Summary  Cambria Osten ZHY:[id number] DOB: 1975-07-28 DOA: 08/07/2019  PCP: Patient, No Pcp Per  Admit date: 08/07/2019 Discharge date: 09/08/2019  Admitted From: Home Disposition:  Home  Discharge Condition:Stable CODE STATUS:FULL Diet recommendation: Regular   Brief/Interim Summary: Patient is a 44 year old female with history of IV drug abuse, tobacco abuse who presents with severe neck pain and upper back pain.  In the emergency department she was found to have leukocytosis, elevated CRP.  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  Neurosurgery was consulted and she has required surgical intervention on 08/08/2019.  She was found to have MSSA bacteremia and started on IV antibiotics.  Antibiotics changed to doxycycline due   to progressively elevated liver function test.Suspicion for DILI.    Liver enzymes are finally trending down.  She is stable for discharge with doxycycline.  Following problems were addressed during her hospitalization:   MSSA cervical discitis/osteomyelitis: Status post C3-7 laminectomy , C3-6 PSIF.  She will follow-up with neurosurgery as an outpatient.  MSSA bacteremia: She was on cefazolin.  Plan was to continue 4 to 5 weeks of IV cefazolin as an inpatient and switch to oral therapy.  Rifampin has been stopped due to elevated liver enzymes.  2D echo did not show any vegetations.  She needs long-term oral antibiotics given placement of hardware.  ID recalled  because of progressive worsening of liver function.  Stopped cefazolin and started on doxycycline. Echo repeated without finding of any vegetation.  She will follow-up with ID as an outpatient.  IV drug use: Counseled for cessation.  HIV, hepatitis B and hepatitis C negative.  UDS was positive for benzodiazepines, opiates, THC.  Patient is also a smoker.  Continue nicotine patch. We offered drug  rehabilitation and also follow-up with pain management for suboxone.  Elevated LFTs: High suspicion for DILI  secondary to cefazolin/lovenox. liver ultrasound unremarkable.    LFTs finally trending down.Continue to monitor LFTs.  Hepatitis panel negative twice.  She does not have any right upper quadrant tenderness or hepatomegaly. GI folllowing and ordered serological panel to rule out chronic liver disease.Repeat LFT in 10 days  Neck pain/back pain: On ibuprofen.  Continue supportive care  Normocytic anemia: Currently hemoglobin stable.  Hypomagnesemia: Supplemented   Discharge Diagnoses:  Principal Problem:   Discitis of cervical region Active Problems:   Cervical pain (neck)   IVDU (intravenous drug user)   Tobacco abuse   Neck pain   Bandemia   MSSA bacteremia   Epidural abscess   Hardware complicating wound infection ([place])   Malnutrition of moderate degree   LFT elevation   Chronic hepatitis C with hepatic coma ([place])   Hepatitis    Discharge Instructions  Discharge Instructions    Ambulatory referral to Occupational Therapy   Complete by: As directed    Ambulatory referral to Physical Therapy   Complete by: As directed    Diet general   Complete by: As directed    Discharge instructions   Complete by: As directed    1)Please quit IV drug use 2)You have an appointment at [facility] treatment center on 09/10/2019 for suboxone programme 3)Take prescribed medications as instructed. 4)Follow up with neurosurgery in 2 weeks.  Name and number the provider has been adttached.  Call for appointment. 5)Follow up with infectious disease on the given appointment date.  Name and number the provider has been attached. 6)Follow up at [facility]  family [facility] on 09/17/19.Check your liver function test during the follow-up.   No wound care   Complete by: As directed      Allergies as of 09/08/2019      Reactions   Gabapentin Other (See Comments)   Causes extreme  agitation.    Cefaclor    Cephalosporins    Nitrofurantoin    Penicillins    Spoke with patient's family. They report that Ms. Farnworth had a rash/hives when she was 18-89 years old. She did not have any shortness of breath and they do not recall having to take her to the doctor's office or hospital to treat the reaction.       Medication List    TAKE these medications   acetaminophen 500 MG tablet Commonly known as: TYLENOL Take 2,000 mg by mouth every 6 (six) hours as needed for mild pain.   buprenorphine-naloxone 8-2 mg Subl SL tablet Commonly known as: SUBOXONE Place 1 tablet under the tongue daily for 2 days. Start taking on: September 09, 2019   doxycycline 100 MG tablet Commonly known as: ADOXA Take 1 tablet (100 mg total) by mouth 2 (two) times daily.   folic acid 1 MG tablet Commonly known as: FOLVITE Take 1 tablet (1 mg total) by mouth daily. Start taking on: September 09, 2019   ibuprofen 400 MG tablet Commonly known as: ADVIL Take 1 tablet (400 mg total) by mouth every 6 (six) hours as needed for moderate pain.   magnesium oxide 400 (241.3 Mg) MG tablet Commonly known as: MAG-OX Take 2 tablets (800 mg total) by mouth daily. Start taking on: September 09, 2019   nicotine 14 mg/24hr patch Commonly known as: NICODERM CQ - dosed in mg/24 hours Place 1 patch (14 mg total) onto the skin daily. Start taking on: September 09, 2019   thiamine 100 MG tablet Take 1 tablet (100 mg total) by mouth daily. Start taking on: September 09, 2019            Durable Medical Equipment  (From admission, onward)         Start     Ordered   09/08/19 1009  For home use only DME 3 n 1  Once        09/08/19 1008          Follow-up Information    [facility] [facility], FNP Follow up.   Specialties: Family Medicine, Infectious Diseases Why: 9/7 at 10am. Please call to reschedule if unable to make this appointment.  Contact information: [address] [place]  [address] [phone]        [facility] Follow up on 09/10/2019.   Why: Your appointment is at 6 am. Please bring your insurance card and form of ID. Please also bring a co pay of $25 just in case your insurance wont cover it.  Contact information: [address] [place], [place] [address]  [facility] Follow up on 09/17/2019.   Why: Your appt is at 10:50 am. Please arrive early and bring a picture ID, insurance card and current medications. Contact information: [place] [id number] [place] Follow up.   Specialty: Rehabilitation Why: The outpatient therapy will contact you for the first appointment  Contact information: [address] [place]  [name], MD. Schedule an appointment as soon as possible for a visit in 2 week(s).   Specialty: Neurosurgery Contact information: [address] [place] [address] [phone]              Allergies  Allergen Reactions  . Gabapentin Other (See Comments)    Causes extreme agitation.   . Cefaclor   . Cephalosporins   . Nitrofurantoin   . Penicillins     Spoke with patient's family. They report that Ms. Razzano had a rash/hives when she was 77-71 years old. She did not have any shortness of breath and they do not recall having to take her to the doctor's office or hospital to treat the reaction.     Consultations:  GI, neurosurgery, ID   Procedures/Studies: ECHOCARDIOGRAM COMPLETE  Result Date: 09/04/2019    ECHOCARDIOGRAM REPORT   Patient Name:   ELLINGTON CORNIA Date of Exam: 09/04/2019 Medical Rec #:  [id number]         Height:       67.0 in Accession #:    [id number]        Weight:       134.9 lb Date of Birth:  05/27/1975         BSA:          30.711 m  Patient Age:    82 years          BP:           137/90 mmHg Patient Gender: F                 HR:           87 bpm. Exam Location:  Inpatient Procedure: 2D Echo, Cardiac Doppler and Color Doppler Indications:    Endocarditis  History:        Patient has prior history of Echocardiogram examinations, most                 recent 08/09/2019. Signs/Symptoms:Bacteremia; Risk Factors:Current                 Smoker. IVDU.  Sonographer:    Roseanna Rainbow RDCS Referring Phys: [id number] [address]  Sonographer Comments: Technically difficult study due to poor echo windows. IMPRESSIONS  1. Left ventricular ejection fraction, by estimation, is 55 to 60%. The left ventricle has normal function. The left ventricle has no regional wall motion abnormalities. There is mild left ventricular hypertrophy. Left ventricular diastolic parameters were normal.  2. Right ventricular systolic function is normal. The right ventricular size is normal. Tricuspid regurgitation signal is inadequate for assessing PA pressure.  3. The mitral valve is normal in structure. Trivial mitral valve regurgitation.  4. The aortic valve is tricuspid. Aortic valve regurgitation is not visualized. No aortic stenosis is present.  5. The inferior vena cava is dilated in size with <50% respiratory variability, suggesting right atrial pressure of 15 mmHg. Conclusion(s)/Recommendation(s): No vegetation seen. If high clinical suspicion for endocarditis, consider TEE. FINDINGS  Left Ventricle: Left ventricular ejection fraction, by estimation, is 55 to 60%. The left ventricle has normal function. The left ventricle has no regional wall motion abnormalities. The left ventricular internal cavity size was normal in size. There is  mild left ventricular hypertrophy. Left ventricular diastolic parameters were normal. Right Ventricle: The right ventricular size is normal. Right vetricular wall thickness was not assessed. Right ventricular systolic function is normal. Tricuspid  regurgitation signal is inadequate for assessing PA pressure.  Left Atrium: Left atrial size was normal in size. Right Atrium: Right atrial size was normal in size. Pericardium: There is no evidence of pericardial effusion. Mitral Valve: The mitral valve is normal in structure. Trivial mitral valve regurgitation. Tricuspid Valve: The tricuspid valve is normal in structure. Tricuspid valve regurgitation is trivial. Aortic Valve: The aortic valve is tricuspid. Aortic valve regurgitation is not visualized. No aortic stenosis is present. Pulmonic Valve: The pulmonic valve was not well visualized. Pulmonic valve regurgitation is not visualized. Aorta: The aortic root and ascending aorta are structurally normal, with no evidence of dilitation. Venous: The inferior vena cava is dilated in size with less than 50% respiratory variability, suggesting right atrial pressure of 15 mmHg. IAS/Shunts: No atrial level shunt detected by color flow Doppler.  LEFT VENTRICLE PLAX 2D LVIDd:         4.50 cm     Diastology LVIDs:         3.20 cm     LV e' lateral:   13.60 cm/s LV PW:         1.60 cm     LV E/e' lateral: 7.2 LV IVS:        1.10 cm     LV e' medial:    9.46 cm/s LVOT diam:     1.90 cm     LV E/e' medial:  10.3 LV SV:         43 LV SV Index:   25 LVOT Area:     2.84 cm  LV Volumes (MOD) LV vol d, MOD A2C: 72.3 ml LV vol d, MOD A4C: 69.4 ml LV vol s, MOD A2C: 30.4 ml LV vol s, MOD A4C: 30.3 ml LV SV MOD A2C:     41.9 ml LV SV MOD A4C:     69.4 ml LV SV MOD BP:      40.4 ml RIGHT VENTRICLE            IVC RV S prime:     9.36 cm/s  IVC diam: 2.10 cm TAPSE (M-mode): 2.2 cm LEFT ATRIUM             Index       RIGHT ATRIUM           Index LA diam:        2.60 cm 1.52 cm/m  RA Area:     11.10 cm LA Vol (A2C):   29.6 ml 17.30 ml/m RA Volume:   24.30 ml  14.20 ml/m LA Vol (A4C):   35.2 ml 20.58 ml/m LA Biplane Vol: 34.6 ml 20.23 ml/m  AORTIC VALVE LVOT Vmax:   80.30 cm/s LVOT Vmean:  55.800 cm/s LVOT VTI:    0.152 m  AORTA Ao  Root diam: 3.20 cm Ao Asc diam:  3.20 cm MITRAL VALVE MV Area (PHT): 5.84 cm    SHUNTS MV Decel Time: 130 msec    Systemic VTI:  0.15 m MV E velocity: 97.30 cm/s  Systemic Diam: 1.90 cm MV A velocity: 53.10 cm/s MV E/A ratio:  1.83 Oswaldo Milian MD Electronically signed by Oswaldo Milian MD Signature Date/Time: 09/04/2019/10:37:48 PM    Final    ECHOCARDIOGRAM COMPLETE  Result Date: 08/09/2019    ECHOCARDIOGRAM REPORT   Patient Name:   TEKA CHANDA Date of Exam: 08/09/2019 Medical Rec #:  [id number]         Height:       67.0 in Accession #:    [id number]  Weight:       134.9 lb Date of Birth:  1975/08/18         BSA:          45.711 m Patient Age:    44 years          BP:           122/74 mmHg Patient Gender: F                 HR:           91 bpm. Exam Location:  Inpatient Procedure: 2D Echo, Cardiac Doppler, Color Doppler and 3D Echo Indications:    Bacteremia  History:        Patient has no prior history of Echocardiogram examinations.                 Signs/Symptoms:Bacteremia; Risk Factors:Current Smoker. IVDU.  Sonographer:    Roseanna Rainbow RDCS Referring Phys: [id number] [facility]  Sonographer Comments: Technically difficult study due to poor echo windows. Patient in fetal position and could not move due to extreme neck pain from neck surgery on previuos day. IMPRESSIONS  1. Left ventricular ejection fraction, by estimation, is 60 to 65%. The left ventricle has normal function. The left ventricle has no regional wall motion abnormalities. Left ventricular diastolic parameters were normal.  2. Right ventricular systolic function is normal. The right ventricular size is normal.  3. The mitral valve is normal in structure. Trivial mitral valve regurgitation. No evidence of mitral stenosis.  4. The aortic valve is tricuspid. Aortic valve regurgitation is not visualized. No aortic stenosis is present.  5. The inferior vena cava is normal in size with greater than 50% respiratory variability,  suggesting right atrial pressure of 3 mmHg. FINDINGS  Left Ventricle: Left ventricular ejection fraction, by estimation, is 60 to 65%. The left ventricle has normal function. The left ventricle has no regional wall motion abnormalities. The left ventricular internal cavity size was normal in size. There is  no left ventricular hypertrophy. Left ventricular diastolic parameters were normal. Right Ventricle: The right ventricular size is normal.Right ventricular systolic function is normal. Left Atrium: Left atrial size was normal in size. Right Atrium: Right atrial size was normal in size. Pericardium: There is no evidence of pericardial effusion. Mitral Valve: The mitral valve is normal in structure. Normal mobility of the mitral valve leaflets. Trivial mitral valve regurgitation. No evidence of mitral valve stenosis. Tricuspid Valve: The tricuspid valve is normal in structure. Tricuspid valve regurgitation is trivial. No evidence of tricuspid stenosis. Aortic Valve: The aortic valve is tricuspid. Aortic valve regurgitation is not visualized. No aortic stenosis is present. Pulmonic Valve: The pulmonic valve was normal in structure. Pulmonic valve regurgitation is not visualized. No evidence of pulmonic stenosis. Aorta: The aortic root is normal in size and structure. Venous: The inferior vena cava is normal in size with greater than 50% respiratory variability, suggesting right atrial pressure of 3 mmHg. IAS/Shunts: No atrial level shunt detected by color flow Doppler.  LEFT VENTRICLE PLAX 2D LVIDd:         4.60 cm     Diastology LVIDs:         3.10 cm     LV e' lateral:   13.30 cm/s LV PW:         1.30 cm     LV E/e' lateral: 7.2 LV IVS:        1.10 cm     LV e' medial:  9.90 cm/s LVOT diam:     1.80 cm     LV E/e' medial:  9.6 LV SV:         55 LV SV Index:   32 LVOT Area:     2.54 cm  LV Volumes (MOD) LV vol d, MOD A2C: 40.5 ml LV vol d, MOD A4C: 85.3 ml LV vol s, MOD A2C: 16.1 ml LV vol s, MOD A4C: 26.4 ml LV  SV MOD A2C:     24.4 ml LV SV MOD A4C:     85.3 ml LV SV MOD BP:      43.5 ml RIGHT VENTRICLE             IVC RV S prime:     11.10 cm/s  IVC diam: 2.20 cm TAPSE (M-mode): 2.1 cm LEFT ATRIUM             Index       RIGHT ATRIUM           Index LA diam:        3.40 cm 1.99 cm/m  RA Area:     11.40 cm LA Vol (A2C):   37.7 ml 22.04 ml/m RA Volume:   25.20 ml  14.73 ml/m LA Vol (A4C):   29.8 ml 17.42 ml/m LA Biplane Vol: 33.3 ml 19.47 ml/m  AORTIC VALVE LVOT Vmax:   119.00 cm/s LVOT Vmean:  90.200 cm/s LVOT VTI:    0.215 m  AORTA Ao Root diam: 2.90 cm Ao Asc diam:  3.00 cm MITRAL VALVE MV Area (PHT): 3.36 cm    SHUNTS MV Decel Time: 226 msec    Systemic VTI:  0.22 m MV E velocity: 95.50 cm/s  Systemic Diam: 1.80 cm MV A velocity: 61.30 cm/s MV E/A ratio:  1.56 Kirk Ruths MD Electronically signed by Kirk Ruths MD Signature Date/Time: 08/09/2019/3:50:14 PM    Final    US Abdomen Limited RUQ  Result Date: 08/28/2019 CLINICAL DATA:  Elevated liver function tests EXAM: ULTRASOUND ABDOMEN LIMITED RIGHT UPPER QUADRANT COMPARISON:  None. FINDINGS: Gallbladder: No gallstones or wall thickening visualized. No sonographic Murphy sign noted by sonographer. Common bile duct: Diameter: 6 mm Liver: No focal lesion identified. Within normal limits in parenchymal echogenicity. Portal vein is patent on color Doppler imaging with normal direction of blood flow towards the liver. Other: None. IMPRESSION: 1. Unremarkable right upper quadrant ultrasound. Electronically Signed   By: Randa Ngo M.D.   On: 08/28/2019 19:54      Subjective: Patient seen and examined the bedside today.  Medically stable for discharge.  Discharge Exam: Vitals:   09/08/19 0339 09/08/19 0844  BP: 101/68 (!) 109/90  Pulse: 75 100  Resp: 16 16  Temp: 97.6 F (36.4 C) 98.4 F (36.9 C)  SpO2: 99% 100%   Vitals:   09/07/19 1946 09/07/19 2346 09/08/19 0339 09/08/19 0844  BP: 112/82 102/69 101/68 (!) 109/90  Pulse: 96 86 75 100   Resp: 17 17 16 16   Temp: 98.6 F (37 C) 98.1 F (36.7 C) 97.6 F (36.4 C) 98.4 F (36.9 C)  TempSrc: Oral Oral Oral Oral  SpO2: 97% 99% 99% 100%  Weight:      Height:        General: Pt is alert, awake, not in acute distress Cardiovascular: RRR, S1/S2 +, no rubs, no gallops Respiratory: CTA bilaterally, no wheezing, no rhonchi Abdominal: Soft, NT, ND, bowel sounds + Extremities: no edema, no cyanosis    The results  of significant diagnostics from this hospitalization (including imaging, microbiology, ancillary and laboratory) are listed below for reference.     Microbiology: No results found for this or any previous visit (from the past 240 hour(s)).   Labs: BNP (last 3 results) No results for input(s): BNP in the last 8760 hours. Basic Metabolic Panel: Recent Labs  Lab 09/02/19 0617 09/03/19 0313 09/05/19 0436 09/08/19 0128  NA 136 138  --   --   K 4.4 4.1  --   --   CL 100 101  --   --   CO2 29 29  --   --   GLUCOSE 107* 112*  --   --   BUN <5* <5*  --   --   CREATININE 0.52 0.50  --   --   CALCIUM 8.9 9.0  --   --   MG 1.7  --  1.4* 1.8   Liver Function Tests: Recent Labs  Lab 09/04/19 0924 09/05/19 0436 09/06/19 0636 09/07/19 0222 09/08/19 0128  AST 451* 431* 483* 316* 279*  ALT 305* 347* 456* 378* 355*  ALKPHOS 283* 302* 326* 337* 306*  BILITOT 0.6 1.0 1.2 1.0 0.7  PROT 5.9* 6.1* 6.9 6.5 6.3*  ALBUMIN 2.5* 2.6* 3.0* 2.8* 2.9*   No results for input(s): LIPASE, AMYLASE in the last 168 hours. No results for input(s): AMMONIA in the last 168 hours. CBC: Recent Labs  Lab 09/05/19 0436  WBC 3.6*  NEUTROABS 1.2*  HGB 10.6*  HCT 32.7*  MCV 91.9  PLT 296   Cardiac Enzymes: No results for input(s): CKTOTAL, CKMB, CKMBINDEX, TROPONINI in the last 168 hours. BNP: Invalid input(s): POCBNP CBG: No results for input(s): GLUCAP in the last 168 hours. D-Dimer No results for input(s): DDIMER in the last 72 hours. Hgb A1c No results for  input(s): HGBA1C in the last 72 hours. Lipid Profile No results for input(s): CHOL, HDL, LDLCALC, TRIG, CHOLHDL, LDLDIRECT in the last 72 hours. Thyroid function studies No results for input(s): TSH, T4TOTAL, T3FREE, THYROIDAB in the last 72 hours.  Invalid input(s): FREET3 Anemia work up Recent Labs    09/06/19 1228  FERRITIN 275  TIBC 421  IRON 84   Urinalysis No results found for: COLORURINE, APPEARANCEUR, LABSPEC, [place], GLUCOSEU, HGBUR, BILIRUBINUR, KETONESUR, PROTEINUR, UROBILINOGEN, NITRITE, LEUKOCYTESUR Sepsis Labs Invalid input(s): PROCALCITONIN,  WBC,  LACTICIDVEN Microbiology No results found for this or any previous visit (from the past 240 hour(s)).  Please note: You were cared for by a hospitalist during your hospital stay. Once you are discharged, your primary care physician will handle any further medical issues. Please note that NO REFILLS for any discharge medications will be authorized once you are discharged, as it is imperative that you return to your primary care physician (or establish a relationship with a primary care physician if you do not have one) for your post hospital discharge needs so that they can reassess your need for medications and monitor your lab values.    Time coordinating discharge: 40 minutes  SIGNED:   Shelly Coss, MD  [facility] 09/08/2019, 11:57 AM Pager [phone]  If 7PM-7AM, please contact night-coverage [email or web address] Password [email or web address]

## 2019-09-08 NOTE — Evaluation (Signed)
Physical Therapy Evaluation Patient Details Name: Monica Eaton MRN: [id number] DOB: 1975/06/16 Today's Date: 09/08/2019   History of Present Illness  Monica Eaton is a 44 y.o. female with medical history significant for tobacco abuse and IV drug abuse who presents to the emergency department due to sudden onset of upper back pain which started about 3 -4 hours PTA.  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 Pt underwent, s/p C3-6 OLaminectomy & fusion. Pt is remaining hospitalized for IV antibiotics.  Clinical Impression  Pt progressing towards physical therapy goals. Was able to perform transfers and ambulation with gross supervision for safety. Pt mildly unsteady but without overt LOB, and able to recover without assistance. Pt reports main concern with returning home is negotiating stairs at her parents' house, and we made this the focus of the session. Pt was able to complete 2 flights with slow pace and railing use for support. Recommend supervision of father initially due to mild unsteadiness. Will continue to follow and progress as able per POC.     Follow Up Recommendations Outpatient PT;Supervision - Intermittent    Equipment Recommendations  Rolling walker with 5" wheels    Recommendations for Other Services       Precautions / Restrictions Precautions Precautions: Cervical;Fall Precaution Booklet Issued: Yes (comment) Precaution Comments: reviewed precautions verbally during functional mobility Required Braces or Orthoses: Cervical Brace Cervical Brace: Soft collar;For comfort Restrictions Other Position/Activity Restrictions: pt not wearing soft collar today      Mobility  Bed Mobility Overal bed mobility: Modified Independent Bed Mobility: Supine to Sit;Sit to Supine     Supine to sit: Supervision Sit to supine: Supervision Sit to sidelying: Supervision General bed mobility comments: Not utilizing  log roll technique. Reviewed log roll after pt had come to sitting and upon return to supine, pt did not perform log roll and required additional review.   Transfers Overall transfer level: Needs assistance Equipment used: None Transfers: Sit to/from Stand Sit to Stand: Supervision         General transfer comment: Light supervision for safety with transfers. Min use of UE's for balance.   Ambulation/Gait Ambulation/Gait assistance: Supervision Gait Distance (Feet): 250 Feet Assistive device: None Gait Pattern/deviations: Step-through pattern;Decreased stride length;Drifts right/left Gait velocity: decreased Gait velocity interpretation: <1.8 ft/sec, indicate of risk for recurrent falls General Gait Details: Occasional drifting in hallway and appears mildly unstable at times. Pt able to recover without assistance, however required close supervision throughout.   Stairs Stairs: Yes Stairs assistance: Min guard;Supervision Stair Management: Step to pattern;Two rails;Forwards Number of Stairs: 20 General stair comments: Close guard for safety as pt negotiated 2 flights of stairs to simulate home environment. No overt LOB noted. VC's for step-to pattern due to fatigue required.   Wheelchair Mobility    Modified Rankin (Stroke Patients Only)       Balance Overall balance assessment: Needs assistance Sitting-balance support: No upper extremity supported Sitting balance-Leahy Scale: Good Sitting balance - Comments: able to sit and complete LB dressing with AE   Standing balance support: During functional activity Standing balance-Leahy Scale: Fair Standing balance comment: pt able to obtain shorts from dresser without LOB                             Pertinent Vitals/Pain Pain Assessment: 0-10 Pain Score: 7  Faces Pain Scale: Hurts a little bit Pain Location: Pt states "shoulder" but indicates  L upper trap area Pain Descriptors / Indicators:  Aching;Guarding;Discomfort;Grimacing Pain Intervention(s): Limited activity within patient's tolerance;Monitored during session;Repositioned;Patient requesting pain meds-RN notified    Home Living                        Prior Function                 Hand Dominance        Extremity/Trunk Assessment   Upper Extremity Assessment Upper Extremity Assessment: Generalized weakness RUE Deficits / Details: pt reports altered sensation making fine motor tasks difficult LUE Deficits / Details: pt reports altered sensation through arms which makes fine motor tasks difficult    Lower Extremity Assessment Lower Extremity Assessment: Defer to PT evaluation       Communication      Cognition Arousal/Alertness: Awake/alert Behavior During Therapy: Anxious Overall Cognitive Status: Impaired/Different from baseline Area of Impairment: Safety/judgement;Memory                     Memory: Decreased recall of precautions Following Commands: Follows one step commands inconsistently;Follows one step commands with increased time Safety/Judgement: Decreased awareness of safety Awareness: Emergent Problem Solving: Difficulty sequencing;Requires verbal cues;Requires tactile cues General Comments: Pt with increased anxiety about going home. Pt able to follow commands with using AE for LB ADLs.       General Comments General comments (skin integrity, edema, etc.): Pt expressed her worries about going home. Therapist encouraged pt to stay active while listening to her body as well. Pt asked about outpatient therapy and discussed options with OT. Pt expresses she is afraid of relapsing and does not want to let everyone and herself down.    Exercises     Assessment/Plan    PT Assessment    PT Problem List         PT Treatment Interventions      PT Goals (Current goals can be found in the Care Plan section)  Acute Rehab PT Goals Patient Stated Goal: Home today to  parents' house PT Goal Formulation: With patient Time For Goal Achievement: 09/11/19 Potential to Achieve Goals: Good    Frequency Min 3X/week   Barriers to discharge        Co-evaluation               AM-PAC PT "6 Clicks" Mobility  Outcome Measure Help needed turning from your back to your side while in a flat bed without using bedrails?: None Help needed moving from lying on your back to sitting on the side of a flat bed without using bedrails?: None Help needed moving to and from a bed to a chair (including a wheelchair)?: None Help needed standing up from a chair using your arms (e.g., wheelchair or bedside chair)?: None Help needed to walk in hospital room?: None Help needed climbing 3-5 steps with a railing? : A Little 6 Click Score: 23    End of Session Equipment Utilized During Treatment: Gait belt Activity Tolerance: Patient tolerated treatment well Patient left: in bed;with call bell/phone within reach Nurse Communication: Mobility status PT Visit Diagnosis: Difficulty in walking, not elsewhere classified (R26.2);Muscle weakness (generalized) (M62.81)    Time: 3532-9924 PT Time Calculation (min) (ACUTE ONLY): 24 min   Charges:     PT Treatments $Gait Training: 23-37 mins        Rolinda Roan, PT, DPT Acute Rehabilitation Services Pager: [phone] Office: [phone]   Thelma Comp 09/08/2019, 11:55  AM

## 2019-09-17 ENCOUNTER — Inpatient Hospital Stay (INDEPENDENT_AMBULATORY_CARE_PROVIDER_SITE_OTHER): Payer: Medicaid Other | Admitting: Primary Care

## 2019-09-29 ENCOUNTER — Telehealth: Payer: Self-pay

## 2019-09-29 MED ORDER — SULFAMETHOXAZOLE-TRIMETHOPRIM 800-160 MG PO TABS: 1.0000 | ORAL_TABLET | Freq: Two times a day (BID) | ORAL | 1 refills | Status: DC

## 2019-09-29 NOTE — Telephone Encounter (Signed)
Patient states she is not allergic to sulfa. Will call office if she is not able to tolerate bactrim Aundria Rud, [place]

## 2019-09-29 NOTE — Addendum Note (Signed)
Addended by: Mauricio Po D on: 09/29/2019 04:25 PM   Modules accepted: Orders

## 2019-09-29 NOTE — Telephone Encounter (Signed)
We can change her to bactrim 800/160 1 tablet by mouth twice daily. #60. Please confirm no allergies to Sulfa.  Thanks!

## 2019-09-29 NOTE — Telephone Encounter (Signed)
Patient called office stating she has been off doxy for several days. Would like alternative medication sent to [facility] on [address]. [place]

## 2019-09-29 NOTE — Telephone Encounter (Signed)
Bactrim has been sent to the pharmacy.

## 2019-09-29 NOTE — Telephone Encounter (Signed)
Patient called to report that since taking doxycyline that she has developed a severe sunburn on her arm. 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 Reports that she developed a rash and would like to consider alternative treatment. Forwarding to provider to review.   Makana Feigel Lorita Officer, RN

## 2019-09-30 ENCOUNTER — Ambulatory Visit (INDEPENDENT_AMBULATORY_CARE_PROVIDER_SITE_OTHER): Payer: Medicaid Other | Admitting: Family Medicine

## 2019-09-30 ENCOUNTER — Other Ambulatory Visit: Payer: Self-pay

## 2019-09-30 ENCOUNTER — Encounter (INDEPENDENT_AMBULATORY_CARE_PROVIDER_SITE_OTHER): Payer: Self-pay | Admitting: Family Medicine

## 2019-09-30 VITALS — BP 140/83 | HR 88 | Ht 67.0 in | Wt 127.0 lb

## 2019-09-30 DIAGNOSIS — R7989 Other specified abnormal findings of blood chemistry: Secondary | ICD-10-CM

## 2019-09-30 DIAGNOSIS — M5010 Cervical disc disorder with radiculopathy, unspecified cervical region: Secondary | ICD-10-CM

## 2019-09-30 DIAGNOSIS — M4642 Discitis, unspecified, cervical region: Secondary | ICD-10-CM

## 2019-09-30 MED ORDER — DULOXETINE HCL 60 MG PO CPEP: 60.0000 mg | ORAL_CAPSULE | Freq: Every day | ORAL | 3 refills | Status: DC

## 2019-09-30 NOTE — Progress Notes (Signed)
HFU for 08/07/19.  States that she has no feeling in her hands.  States that she had a bad reaction to doxycyline.

## 2019-09-30 NOTE — Progress Notes (Signed)
Subjective:  Patient ID: Monica Eaton, female    DOB: 1975-09-22  Age: 44 y.o. MRN: [id number]  CC: Hospitalization Follow-up   HPI Nicholle Falzon is a 44 year old female with a h/o IVDU s/p post C3-6 laminectomy and fusion secondary to cervical discitis. Hospital course was complicated by elevated LFTs. She is on Suboxone therapy.  She had a reaction to Doxycycline which she was discharged on with sunburn and blisters which have improved. 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 Infectious disease changed her antibiotic to Bactrim and she is yet to pick up script; her appointment with them comes up on 10/14/19 and she needs to call Neurosurgery for an appointment. She has numbness in her hands, pins and needles since surgery and this extends from her posterior neck to both hands; she has also been dropping things. Tried Gabapentin in the past and it made her crazy.  She has labs yesterday which she states is being faxed over to infectious disease.  Past Medical History:  Diagnosis Date  . Alcohol abuse   . IV drug user    heroin  . Neck pain, chronic   . Neuropathy     Past Surgical History:  Procedure Laterality Date  . BREAST ENHANCEMENT SURGERY    . CESAREAN SECTION    . FINGER FRACTURE SURGERY Left    left ring finger fracture.  Marland Kitchen RADIOLOGY WITH ANESTHESIA N/A 08/08/2019   Procedure: MRI WITH ANESTHESIA;  Surgeon: Radiologist, Medication, MD;  Location: [facility];  Service: Radiology;  Laterality: N/A;  . WISDOM TOOTH EXTRACTION      Family History  Problem Relation Age of Onset  . Parkinson's disease Mother   . Arthritis Father     Allergies  Allergen Reactions  . Gabapentin Other (See Comments)    Causes extreme agitation.   . Cefaclor   . Cephalosporins   . Nitrofurantoin   . Penicillins     Spoke with patient's family. They report that Ms. Occhipinti had a rash/hives when she was 42-93 years old. She did not have any  shortness of breath and they do not recall having to take her to the doctor's office or hospital to treat the reaction.     Outpatient Medications Prior to Visit  Medication Sig Dispense Refill  . sulfamethoxazole-trimethoprim (BACTRIM DS) 800-160 MG tablet Take 1 tablet by mouth 2 (two) times daily. 60 tablet 1  . acetaminophen (TYLENOL) 500 MG tablet Take 2,000 mg by mouth every 6 (six) hours as needed for mild pain. (Patient not taking: Reported on 09/30/2019)    . folic acid (FOLVITE) 1 MG tablet Take 1 tablet (1 mg total) by mouth daily. (Patient not taking: Reported on 09/30/2019) 30 tablet 1  . ibuprofen (ADVIL) 400 MG tablet Take 1 tablet (400 mg total) by mouth every 6 (six) hours as needed for moderate pain. (Patient not taking: Reported on 09/30/2019) 30 tablet 1  . magnesium oxide (MAG-OX) 400 (241.3 Mg) MG tablet Take 2 tablets (800 mg total) by mouth daily. (Patient not taking: Reported on 09/30/2019) 30 tablet 1  . nicotine (NICODERM CQ - DOSED IN MG/24 HOURS) 14 mg/24hr patch Place 1 patch (14 mg total) onto the skin daily. (Patient not taking: Reported on 09/30/2019) 28 patch 0  . thiamine 100 MG tablet Take 1 tablet (100 mg total) by mouth daily. (Patient not taking: Reported on 09/30/2019) 30 tablet 1   No facility-administered medications prior  to visit.     ROS Review of Systems  Constitutional: Negative for activity change, appetite change and fatigue.  HENT: Negative for congestion, sinus pressure and sore throat.   Eyes: Negative for visual disturbance.  Respiratory: Negative for cough, chest tightness, shortness of breath and wheezing.   Cardiovascular: Negative for chest pain and palpitations.  Gastrointestinal: Negative for abdominal distention, abdominal pain and constipation.  Endocrine: Negative for polydipsia.  Genitourinary: Negative for dysuria and frequency.  Musculoskeletal: Negative for arthralgias and back pain.  Skin: Negative for rash.  Neurological:  Negative for tremors, light-headedness and numbness.  Hematological: Does not bruise/bleed easily.  Psychiatric/Behavioral: Negative for agitation and behavioral problems.    Objective:  BP 140/83   Pulse 88   Ht 5\' 7"  (1.702 m)   Wt 127 lb (57.6 kg)   SpO2 95%   BMI 19.89 kg/m   BP/Weight 09/30/2019 4/0/3474 03/13/9561  Systolic BP 875 643 -  Diastolic BP 83 81 -  Wt. (Lbs) 127 - 134.92  BMI 19.89 - 21.13      Physical Exam Constitutional:      Appearance: She is well-developed.  Neck:     Vascular: No JVD.  Cardiovascular:     Rate and Rhythm: Normal rate.     Heart sounds: Normal heart sounds. No murmur heard.   Pulmonary:     Effort: Pulmonary effort is normal.     Breath sounds: Normal breath sounds. No wheezing or rales.  Chest:     Chest wall: No tenderness.  Abdominal:     General: Bowel sounds are normal. There is no distension.     Palpations: Abdomen is soft. There is no mass.     Tenderness: There is no abdominal tenderness.  Musculoskeletal:        General: Normal range of motion.     Right lower leg: No edema.     Left lower leg: No edema.  Neurological:     Mental Status: She is alert and oriented to person, place, and time.  Psychiatric:        Mood and Affect: Mood normal.     CMP Latest Ref Rng & Units 09/08/2019 09/07/2019 09/06/2019  Glucose 70 - 99 mg/dL - - -  BUN 6 - 20 mg/dL - - -  Creatinine 0.44 - 1.00 mg/dL - - -  Sodium 135 - 145 mmol/L - - -  Potassium 3.5 - 5.1 mmol/L - - -  Chloride 98 - 111 mmol/L - - -  CO2 22 - 32 mmol/L - - -  Calcium 8.9 - 10.3 mg/dL - - -  Total Protein 6.5 - 8.1 g/dL 6.3(L) 6.5 6.9  Total Bilirubin 0.3 - 1.2 mg/dL 0.7 1.0 1.2  Alkaline Phos 38 - 126 U/L 306(H) 337(H) 326(H)  AST 15 - 41 U/L 279(H) 316(H) 483(H)  ALT 0 - 44 U/L 355(H) 378(H) 456(H)    Lipid Panel  No results found for: CHOL, TRIG, HDL, CHOLHDL, VLDL, LDLCALC, LDLDIRECT  CBC    Component Value Date/Time   WBC 3.6 (L) 09/05/2019 0436    RBC 3.56 (L) 09/05/2019 0436   HGB 10.6 (L) 09/05/2019 0436   HCT 32.7 (L) 09/05/2019 0436   PLT 296 09/05/2019 0436   MCV 91.9 09/05/2019 0436   MCH 29.8 09/05/2019 0436   MCHC 32.4 09/05/2019 0436   RDW 13.0 09/05/2019 0436   LYMPHSABS 1.5 09/05/2019 0436   MONOABS 0.6 09/05/2019 0436   EOSABS 0.3 09/05/2019 0436  BASOSABS 0.0 09/05/2019 0436    No results found for: HGBA1C  Assessment & Plan:  1. Discitis of cervical region S/p C3-6 laminectomy and fusion Needs to pick up Bactrim from Pharmacy F/u with ID and Neurosurgery  2. Cervical disc disorder with radiculopathy of cervical region Unable to tolerate Gabapentin in the past Placed on Cymbalta - DULoxetine (CYMBALTA) 60 MG capsule; Take 1 capsule (60 mg total) by mouth daily. For neuropathy  Dispense: 30 capsule; Refill: 3  3. Elevated LFTs Had labs yesterday which will be reviewed by ID I will hold off on ordering additional labs today. .  Meds ordered this encounter  Medications  . DULoxetine (CYMBALTA) 60 MG capsule    Sig: Take 1 capsule (60 mg total) by mouth daily. For neuropathy    Dispense:  30 capsule    Refill:  3    Follow-up: Return in about 3 months (around 12/31/2019) for medical conditions with PCP.       Charlott Rakes, MD, FAAFP. [facility], [place]   09/30/2019, 10:19 AM

## 2019-09-30 NOTE — Patient Instructions (Signed)

## 2019-10-03 ENCOUNTER — Other Ambulatory Visit: Payer: Self-pay

## 2019-10-03 ENCOUNTER — Other Ambulatory Visit: Payer: Medicaid Other

## 2019-10-03 DIAGNOSIS — Z20822 Contact with and (suspected) exposure to covid-19: Secondary | ICD-10-CM

## 2019-10-04 LAB — NOVEL CORONAVIRUS, NAA: SARS-CoV-2, NAA: NOT DETECTED

## 2019-10-04 LAB — SARS-COV-2, NAA 2 DAY TAT

## 2019-10-14 ENCOUNTER — Encounter: Payer: Self-pay | Admitting: Family

## 2019-10-14 ENCOUNTER — Ambulatory Visit (INDEPENDENT_AMBULATORY_CARE_PROVIDER_SITE_OTHER): Payer: Medicaid Other | Admitting: Family

## 2019-10-14 ENCOUNTER — Other Ambulatory Visit: Payer: Self-pay

## 2019-10-14 VITALS — BP 133/89 | HR 91 | Temp 98.0°F | Wt 131.0 lb

## 2019-10-14 DIAGNOSIS — B182 Chronic viral hepatitis C: Secondary | ICD-10-CM

## 2019-10-14 DIAGNOSIS — F1990 Other psychoactive substance use, unspecified, uncomplicated: Secondary | ICD-10-CM

## 2019-10-14 DIAGNOSIS — M5010 Cervical disc disorder with radiculopathy, unspecified cervical region: Secondary | ICD-10-CM | POA: Diagnosis not present

## 2019-10-14 DIAGNOSIS — B9561 Methicillin susceptible Staphylococcus aureus infection as the cause of diseases classified elsewhere: Secondary | ICD-10-CM | POA: Diagnosis not present

## 2019-10-14 DIAGNOSIS — M4642 Discitis, unspecified, cervical region: Secondary | ICD-10-CM

## 2019-10-14 DIAGNOSIS — R7881 Bacteremia: Secondary | ICD-10-CM | POA: Diagnosis not present

## 2019-10-14 MED ORDER — SULFAMETHOXAZOLE-TRIMETHOPRIM 800-160 MG PO TABS: 1.0000 | ORAL_TABLET | Freq: Two times a day (BID) | ORAL | 3 refills | Status: DC

## 2019-10-14 NOTE — Progress Notes (Signed)
Subjective:    Patient ID: Monica Eaton, female    DOB: Mar 23, 1975, 44 y.o.   MRN: [id number]  Chief Complaint  Patient presents with  . Hospitalization Follow-up    No questions or concerns     HPI:  Monica Eaton is a 44 y.o. female with previous medical history of IV drug use who was admitted to the hospital with severe neck and back pain with MRI of the cervical spine showing discitis/osteomyelitis with epidural abscess.  Neurosurgery performed posterior C3-C7 laminectomies and C3-C6 instrumented fusions.  Frank purulence was not encountered in the OR.  Blood cultures positive for MSSA.  Echocardiogram was without evidence of endocarditis.  Hospitalization was complicated by transaminitis towards the end of her stay.  Rifampin was stopped secondary to transaminitis.  She was discharged on doxycycline.  Notified office on 09/29/2019 with rash related to doxycycline and change to sulfamethoxazole trimethoprim.  Here today for hospital follow-up.  Monica Eaton has been taking her Bactrim twice daily as prescribed with no adverse side effects or missed doses.  Overall feeling well today with improvements.  And numbness/tingling since leaving the hospital.  She is able to work currently and is being seen at a Suboxone clinic for opioid use disorder.  She has remained clean since leaving the hospital.  Continues to have neck pain as well as residual numbness/tingling in her fingers.  Denies fevers, chills, or sweats.  She is interested in the Covid vaccine.   Allergies  Allergen Reactions  . Gabapentin Other (See Comments)    Causes extreme agitation.   . Cefaclor   . Cephalosporins   . Nitrofurantoin   . Penicillins     Spoke with patient's family. They report that Monica Eaton had a rash/hives when she was 66-91 years old. She did not have any shortness of breath and they do not recall having to take her to the doctor's office or hospital to treat the reaction.       Outpatient  Medications Prior to Visit  Medication Sig Dispense Refill  . sulfamethoxazole-trimethoprim (BACTRIM DS) 800-160 MG tablet Take 1 tablet by mouth 2 (two) times daily. 60 tablet 1  . acetaminophen (TYLENOL) 500 MG tablet Take 2,000 mg by mouth every 6 (six) hours as needed for mild pain. (Patient not taking: Reported on 09/30/2019)    . DULoxetine (CYMBALTA) 60 MG capsule Take 1 capsule (60 mg total) by mouth daily. For neuropathy (Patient not taking: Reported on 10/14/2019) 30 capsule 3  . folic acid (FOLVITE) 1 MG tablet Take 1 tablet (1 mg total) by mouth daily. (Patient not taking: Reported on 09/30/2019) 30 tablet 1  . ibuprofen (ADVIL) 400 MG tablet Take 1 tablet (400 mg total) by mouth every 6 (six) hours as needed for moderate pain. (Patient not taking: Reported on 09/30/2019) 30 tablet 1  . magnesium oxide (MAG-OX) 400 (241.3 Mg) MG tablet Take 2 tablets (800 mg total) by mouth daily. (Patient not taking: Reported on 09/30/2019) 30 tablet 1  . nicotine (NICODERM CQ - DOSED IN MG/24 HOURS) 14 mg/24hr patch Place 1 patch (14 mg total) onto the skin daily. (Patient not taking: Reported on 09/30/2019) 28 patch 0  . thiamine 100 MG tablet Take 1 tablet (100 mg total) by mouth daily. (Patient not taking: Reported on 09/30/2019) 30 tablet 1   No facility-administered medications prior to visit.     Past Medical History:  Diagnosis Date  . Alcohol abuse   . IV drug user  heroin  . Neck pain, chronic   . Neuropathy      Past Surgical History:  Procedure Laterality Date  . BREAST ENHANCEMENT SURGERY    . CESAREAN SECTION    . FINGER FRACTURE SURGERY Left    left ring finger fracture.  Marland Kitchen RADIOLOGY WITH ANESTHESIA N/A 08/08/2019   Procedure: MRI WITH ANESTHESIA;  Surgeon: Radiologist, Medication, MD;  Location: [facility];  Service: Radiology;  Laterality: N/A;  . WISDOM TOOTH EXTRACTION         Review of Systems  Constitutional: Negative for chills, fatigue, fever and unexpected weight  change.  Respiratory: Negative for cough, chest tightness, shortness of breath and wheezing.   Cardiovascular: Negative for chest pain and leg swelling.  Gastrointestinal: Negative for abdominal distention, constipation, diarrhea, nausea and vomiting.  Musculoskeletal: Positive for neck pain.  Neurological: Positive for numbness. Negative for dizziness, weakness, light-headedness and headaches.  Hematological: Does not bruise/bleed easily.      Objective:    BP 133/89   Pulse 91   Temp 98 F (36.7 C)   Wt 131 lb (59.4 kg)   SpO2 99%   BMI 20.52 kg/m  Nursing note and vital signs reviewed.  Physical Exam Constitutional:      General: She is not in acute distress.    Appearance: She is well-developed.  Cardiovascular:     Rate and Rhythm: Normal rate and regular rhythm.     Heart sounds: Normal heart sounds. No murmur heard.  No friction rub. No gallop.   Pulmonary:     Effort: Pulmonary effort is normal. No respiratory distress.     Breath sounds: Normal breath sounds. No wheezing or rales.  Chest:     Chest wall: No tenderness.  Abdominal:     General: Bowel sounds are normal. There is no distension.     Palpations: Abdomen is soft. There is no mass.     Tenderness: There is no abdominal tenderness. There is no guarding or rebound.  Skin:    General: Skin is warm and dry.  Neurological:     Mental Status: She is alert and oriented to person, place, and time.  Psychiatric:        Behavior: Behavior normal.        Thought Content: Thought content normal.        Judgment: Judgment normal.      Depression screen PHQ 2/9 09/30/2019  Decreased Interest 1  Down, Depressed, Hopeless 1  PHQ - 2 Score 2  Altered sleeping 2  Tired, decreased energy 2  Change in appetite 1  Feeling bad or failure about yourself  3  Trouble concentrating 1  Moving slowly or fidgety/restless 0  Suicidal thoughts 0  PHQ-9 Score 11       Assessment & Plan:    Patient Active Problem  List   Diagnosis Date Noted  . LFT elevation   . Chronic hepatitis C with hepatic coma ([place])   . Hepatitis   . Malnutrition of moderate degree 08/19/2019  . Epidural abscess   . Discitis of cervical region   . Hardware complicating wound infection ([place])   . Bandemia   . MSSA bacteremia   . Cervical pain (neck) 08/08/2019  . IVDU (intravenous drug user) 08/08/2019  . Tobacco abuse 08/08/2019  . Neck pain 08/08/2019     Problem List Items Addressed This Visit      Digestive   Chronic hepatitis C with hepatic coma ([place])    Previous  lab work in the hospital with a viral load of 15,800,000 IU/mL.  Recheck lab work today for hepatitis C to confirm decrease in viral load and possible resolution independently.  Acute hepatitis C infection may be the cause of her previous transaminitis although unclear as to how she obtained it during her hospitalization.  Discussed the pathogenesis, risks if left untreated, and treatment options for hepatitis C.  Additional follow-up pending blood work results if needed.      Relevant Medications   sulfamethoxazole-trimethoprim (BACTRIM DS) 800-160 MG tablet   Other Relevant Orders   Hepatitis C RNA quantitative   Hepatitis C antibody   Hepatitis C genotype   Hepatic function panel   Liver Fibrosis, FibroTest-ActiTest   Basic metabolic panel     Musculoskeletal and Integument   Discitis of cervical region - Primary    Status post laminectomy and instrumented fusion with course complicated by transaminitis likely related to rifampin.  Currently maintained on sulfamethoxazole trimethoprim with no adverse side effects.  Will continue to need prolonged course of antibiotic therapy orally for suppression.  Check lab work today to assess for renal function for therapeutic drug monitoring.  Plan for follow-up in 3 months or sooner if needed.      Relevant Medications   sulfamethoxazole-trimethoprim (BACTRIM DS) 800-160 MG tablet   Other Relevant Orders    C-reactive protein   Sedimentation rate   Basic metabolic panel     Other   IVDU (intravenous drug user)    Currently maintained on Suboxone through outpatient clinic.  Appears to be doing well with no cravings or relapses.  Continue current dose of Suboxone for opioid use disorder.      MSSA bacteremia    Previous blood cultures clear with no further systemic symptoms of fever or chills.  Continues to be maintained on sulfamethoxazole-trimethoprim.      Relevant Medications   sulfamethoxazole-trimethoprim (BACTRIM DS) 800-160 MG tablet   Other Relevant Orders   C-reactive protein   Sedimentation rate   Basic metabolic panel    Other Visit Diagnoses    Cervical disc disorder with radiculopathy of cervical region       Relevant Medications   sulfamethoxazole-trimethoprim (BACTRIM DS) 800-160 MG tablet   Other Relevant Orders   C-reactive protein   Sedimentation rate   Basic metabolic panel       I have discontinued Tereasa Coop "Chrissie"'s acetaminophen, ibuprofen, magnesium oxide, folic acid, thiamine, nicotine, and DULoxetine. I am also having her maintain her sulfamethoxazole-trimethoprim.   Meds ordered this encounter  Medications  . sulfamethoxazole-trimethoprim (BACTRIM DS) 800-160 MG tablet    Sig: Take 1 tablet by mouth 2 (two) times daily.    Dispense:  60 tablet    Refill:  3    Order Specific Question:   Supervising Provider    Answer:   Carlyle Basques [[id number]]     Follow-up: Return in about 3 months (around 01/13/2020), or if symptoms worsen or fail to improve.   Terri Piedra, MSN, FNP-C Nurse Practitioner [facility] for Infectious Disease [place] number: [phone]

## 2019-10-14 NOTE — Patient Instructions (Signed)
Nice to see you.  Continue to take your Bactrim twice daily as prescribed.  We will check your lab work today including for Hepatitis C  COVID vaccine at your convenience  Plan for follow up in 3 months or sooner if needed.  Keep up the great work!

## 2019-10-14 NOTE — Assessment & Plan Note (Signed)
Status post laminectomy and instrumented fusion with course complicated by transaminitis likely related to rifampin.  Currently maintained on sulfamethoxazole trimethoprim with no adverse side effects.  Will continue to need prolonged course of antibiotic therapy orally for suppression.  Check lab work today to assess for renal function for therapeutic drug monitoring.  Plan for follow-up in 3 months or sooner if needed.

## 2019-10-14 NOTE — Assessment & Plan Note (Signed)
Currently maintained on Suboxone through outpatient clinic.  Appears to be doing well with no cravings or relapses.  Continue current dose of Suboxone for opioid use disorder.

## 2019-10-14 NOTE — Assessment & Plan Note (Signed)
Previous lab work in the hospital with a viral load of 15,800,000 IU/mL.  Recheck lab work today for hepatitis C to confirm decrease in viral load and possible resolution independently.  Acute hepatitis C infection may be the cause of her previous transaminitis although unclear as to how she obtained it during her hospitalization.  Discussed the pathogenesis, risks if left untreated, and treatment options for hepatitis C.  Additional follow-up pending blood work results if needed.

## 2019-10-14 NOTE — Assessment & Plan Note (Signed)
Previous blood cultures clear with no further systemic symptoms of fever or chills.  Continues to be maintained on sulfamethoxazole-trimethoprim.

## 2019-10-18 LAB — LIVER FIBROSIS, FIBROTEST-ACTITEST
ALT: 49 U/L — ABNORMAL HIGH (ref 6–29)
Alpha-2-Macroglobulin: 195 mg/dL (ref 106–279)
Apolipoprotein A1: 140 mg/dL (ref 101–198)
Bilirubin: 0.4 mg/dL (ref 0.2–1.2)
Fibrosis Score: 0.29
GGT: 114 U/L — ABNORMAL HIGH (ref 3–55)
Haptoglobin: 66 mg/dL (ref 43–212)
Necroinflammat ACT Score: 0.28
Reference ID: 3544027

## 2019-10-18 LAB — BASIC METABOLIC PANEL
BUN: 10 mg/dL (ref 7–25)
CO2: 27 mmol/L (ref 20–32)
Calcium: 9.9 mg/dL (ref 8.6–10.2)
Chloride: 100 mmol/L (ref 98–110)
Creat: 0.71 mg/dL (ref 0.50–1.10)
Glucose, Bld: 77 mg/dL (ref 65–99)
Potassium: 4.9 mmol/L (ref 3.5–5.3)
Sodium: 134 mmol/L — ABNORMAL LOW (ref 135–146)

## 2019-10-18 LAB — HEPATITIS C GENOTYPE

## 2019-10-18 LAB — HEPATIC FUNCTION PANEL
AG Ratio: 1.5 (calc) (ref 1.0–2.5)
ALT: 48 U/L — ABNORMAL HIGH (ref 6–29)
AST: 32 U/L — ABNORMAL HIGH (ref 10–30)
Albumin: 4.3 g/dL (ref 3.6–5.1)
Alkaline phosphatase (APISO): 93 U/L (ref 31–125)
Bilirubin, Direct: 0.1 mg/dL (ref 0.0–0.2)
Globulin: 2.8 g/dL (calc) (ref 1.9–3.7)
Indirect Bilirubin: 0.4 mg/dL (calc) (ref 0.2–1.2)
Total Bilirubin: 0.5 mg/dL (ref 0.2–1.2)
Total Protein: 7.1 g/dL (ref 6.1–8.1)

## 2019-10-18 LAB — C-REACTIVE PROTEIN: CRP: 0.6 mg/L (ref <8.0)

## 2019-10-18 LAB — HEPATITIS C ANTIBODY
Hepatitis C Ab: REACTIVE — AB
SIGNAL TO CUT-OFF: 19.7 — ABNORMAL HIGH (ref <1.00)

## 2019-10-18 LAB — HEPATITIS C RNA QUANTITATIVE
HCV Quantitative Log: 3.76 Log IU/mL — ABNORMAL HIGH
HCV RNA, PCR, QN (Log): 3.93 log IU/mL — ABNORMAL HIGH
HCV RNA, PCR, QN: 5690 IU/mL — ABNORMAL HIGH
HCV RNA, PCR, QN: 8520 IU/mL — ABNORMAL HIGH

## 2019-10-18 LAB — SEDIMENTATION RATE: Sed Rate: 2 mm/h (ref 0–20)

## 2019-11-07 ENCOUNTER — Telehealth: Payer: Self-pay

## 2019-11-07 NOTE — Telephone Encounter (Signed)
Received call in triage. Patients she is experiencing some discomfort, itching, and thick white-grayish discharge from vaginal area. Patient requesting medication for vaginal yeast infection. Routing message to Terri Piedra, NP to make aware for advise. Eugenia Mcalpine

## 2019-11-10 MED ORDER — FLUCONAZOLE 150 MG PO TABS: 150.0000 mg | ORAL_TABLET | Freq: Once | ORAL | 1 refills | Status: AC

## 2019-11-10 NOTE — Addendum Note (Signed)
Addended by: Mauricio Po D on: 11/10/2019 09:37 AM   Modules accepted: Orders

## 2019-11-10 NOTE — Telephone Encounter (Signed)
Medication sent.

## 2019-12-31 ENCOUNTER — Ambulatory Visit (INDEPENDENT_AMBULATORY_CARE_PROVIDER_SITE_OTHER): Payer: Medicaid Other | Admitting: Primary Care

## 2020-02-02 ENCOUNTER — Telehealth: Payer: Self-pay

## 2020-02-02 NOTE — Telephone Encounter (Signed)
Left patient a voice mail to reschedule a hospital follow up with Monica Eaton

## 2020-04-05 ENCOUNTER — Ambulatory Visit: Payer: Medicaid Other | Admitting: Family

## 2020-04-16 ENCOUNTER — Telehealth: Payer: Self-pay

## 2020-04-16 DIAGNOSIS — B9561 Methicillin susceptible Staphylococcus aureus infection as the cause of diseases classified elsewhere: Secondary | ICD-10-CM

## 2020-04-16 DIAGNOSIS — R7881 Bacteremia: Secondary | ICD-10-CM

## 2020-04-16 DIAGNOSIS — M5010 Cervical disc disorder with radiculopathy, unspecified cervical region: Secondary | ICD-10-CM

## 2020-04-16 DIAGNOSIS — M4642 Discitis, unspecified, cervical region: Secondary | ICD-10-CM

## 2020-04-16 NOTE — Telephone Encounter (Signed)
Patient called to reschedule missed follow up appointment with Terri Piedra, NP. Patient also requesting refill for Bactrim as she has enough to get her through the weekend. Routing to provider for approval. Patient accepts appointment for 3/31 [facility]

## 2020-04-19 MED ORDER — SULFAMETHOXAZOLE-TRIMETHOPRIM 800-160 MG PO TABS: 1.0000 | ORAL_TABLET | Freq: Two times a day (BID) | ORAL | 3 refills | Status: DC

## 2020-04-19 NOTE — Addendum Note (Signed)
Addended by: Eugenia Mcalpine on: 04/19/2020 02:57 PM   Modules accepted: Orders

## 2020-04-19 NOTE — Telephone Encounter (Signed)
Ok to refill 

## 2020-05-06 ENCOUNTER — Ambulatory Visit: Payer: Medicaid Other | Admitting: Family

## 2020-05-24 ENCOUNTER — Ambulatory Visit (INDEPENDENT_AMBULATORY_CARE_PROVIDER_SITE_OTHER): Payer: Medicaid Other | Admitting: Family

## 2020-05-24 ENCOUNTER — Encounter: Payer: Self-pay | Admitting: Family

## 2020-05-24 ENCOUNTER — Other Ambulatory Visit: Payer: Self-pay

## 2020-05-24 VITALS — BP 115/75 | HR 96 | Wt 116.0 lb

## 2020-05-24 DIAGNOSIS — R7881 Bacteremia: Secondary | ICD-10-CM | POA: Diagnosis not present

## 2020-05-24 DIAGNOSIS — M4642 Discitis, unspecified, cervical region: Secondary | ICD-10-CM

## 2020-05-24 DIAGNOSIS — M5010 Cervical disc disorder with radiculopathy, unspecified cervical region: Secondary | ICD-10-CM | POA: Diagnosis present

## 2020-05-24 DIAGNOSIS — B9561 Methicillin susceptible Staphylococcus aureus infection as the cause of diseases classified elsewhere: Secondary | ICD-10-CM | POA: Diagnosis not present

## 2020-05-24 DIAGNOSIS — F1990 Other psychoactive substance use, unspecified, uncomplicated: Secondary | ICD-10-CM

## 2020-05-24 DIAGNOSIS — B182 Chronic viral hepatitis C: Secondary | ICD-10-CM

## 2020-05-24 DIAGNOSIS — T847XXD Infection and inflammatory reaction due to other internal orthopedic prosthetic devices, implants and grafts, subsequent encounter: Secondary | ICD-10-CM | POA: Diagnosis not present

## 2020-05-24 MED ORDER — SULFAMETHOXAZOLE-TRIMETHOPRIM 800-160 MG PO TABS: 1.0000 | ORAL_TABLET | Freq: Two times a day (BID) | ORAL | 3 refills | Status: DC

## 2020-05-24 NOTE — Progress Notes (Signed)
Subjective:    Patient ID: Monica Eaton, female    DOB: 05-06-1975, 45 y.o.   MRN: [id number]  Chief Complaint  Patient presents with  . MSSA Bacteremia and Hardware Infection     HPI:  Monica Eaton is a 45 y.o. female status post posterior C3-C7 laminectomies and C3-C6 instrumented fusions with cultures positive for MSSA and completion of 6 weeks of cefazolin initially started on doxycycline with rash developing and changed to Bactrim presenting today for follow-up.  Monica Eaton has been taking her Bactrim twice daily as prescribed with no adverse side effects or missed doses since her last office visit.  Continues to have neck pain and was noted to have additional disc pathology with right sided radiculopathy status post cortisone injection. Completed course of rehabilitation at [facility] and was maintained on Suboxne. 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 Has lost 15 pounds recently and friends have expressed concern for her overall well being. Denies any current fevers/chills or increased neck pain. Currently staying with a friend. Has concerns about wounds on her hands and scratches on her body healing.    Allergies  Allergen Reactions  . Gabapentin Other (See Comments)    Causes extreme agitation.   . Cefaclor   . Cephalosporins   . Nitrofurantoin   . Penicillins     Spoke with patient's family. They report that Monica Eaton had a rash/hives when she was 22-34 years old. She did not have any shortness of breath and they do not recall having to take her to the doctor's office or hospital to treat the reaction.       Outpatient Medications Prior to Visit  Medication Sig Dispense Refill  . sulfamethoxazole-trimethoprim (BACTRIM DS) 800-160 MG tablet Take 1 tablet by mouth 2 (two) times daily. 60 tablet 3   No facility-administered medications prior to visit.     Past  Medical History:  Diagnosis Date  . Alcohol abuse   . IV drug user    heroin  . Neck pain, chronic   . Neuropathy      Past Surgical History:  Procedure Laterality Date  . BREAST ENHANCEMENT SURGERY    . CESAREAN SECTION    . FINGER FRACTURE SURGERY Left    left ring finger fracture.  Marland Kitchen RADIOLOGY WITH ANESTHESIA N/A 08/08/2019   Procedure: MRI WITH ANESTHESIA;  Surgeon: Radiologist, Medication, MD;  Location: [facility];  Service: Radiology;  Laterality: N/A;  . WISDOM TOOTH EXTRACTION      Review of Systems  Constitutional: Positive for appetite change and unexpected weight change. Negative for chills, diaphoresis, fatigue and fever.  Respiratory: Negative for cough, chest tightness, shortness of breath and wheezing.   Cardiovascular: Negative for chest pain.  Gastrointestinal: Negative for abdominal pain, diarrhea, nausea and vomiting.  Musculoskeletal: Positive for neck pain.      Objective:    BP 115/75   Pulse 96   Wt 116 lb (52.6 kg)   BMI 18.17 kg/m  Nursing note and vital signs reviewed.  Physical Exam Constitutional:      General: She is not in acute distress.    Appearance: She is well-developed. She is ill-appearing.  Cardiovascular:     Rate and Rhythm: Regular rhythm. Tachycardia present.     Heart sounds: Normal heart sounds.  Pulmonary:     Effort: Pulmonary effort is normal.     Breath sounds: Normal breath sounds.  Skin:    General: Skin is warm and dry.  Neurological:     Mental Status: She is alert and oriented to person, place, and time.  Psychiatric:        Mood and Affect: Mood normal.      Depression screen [facility] 2/9 09/30/2019  Decreased Interest 1  Down, Depressed, Hopeless 1  PHQ - 2 Score 2  Altered sleeping 2  Tired, decreased energy 2  Change in appetite 1  Feeling bad or failure about yourself  3  Trouble concentrating 1  Moving slowly or fidgety/restless 0  Suicidal thoughts 0  PHQ-9 Score 11       Assessment & Plan:     Patient Active Problem List   Diagnosis Date Noted  . LFT elevation   . Chronic hepatitis C with hepatic coma ([place])   . Hepatitis   . Malnutrition of moderate degree 08/19/2019  . Epidural abscess   . Discitis of cervical region   . Hardware complicating wound infection ([place])   . Bandemia   . MSSA bacteremia   . Cervical pain (neck) 08/08/2019  . IVDU (intravenous drug user) 08/08/2019  . Tobacco abuse 08/08/2019  . Neck pain 08/08/2019     Problem List Items Addressed This Visit      Digestive   Chronic hepatitis C with hepatic coma ([place])    Monica Eaton's previous blood work is consistent with chronic Hepatitis C infection and remains at high risk with continued drug use. Will work to get her stabilized and then pursue treatment for Hepatitis C.       Relevant Medications   sulfamethoxazole-trimethoprim (BACTRIM DS) 800-160 MG tablet     Musculoskeletal and Integument   Discitis of cervical region   Relevant Medications   sulfamethoxazole-trimethoprim (BACTRIM DS) 800-160 MG tablet     Other   IVDU (intravenous drug user)    Monica Eaton has relapsed with heroin and methamphetamine usage following treatment [facility]. Opioid use disorder treated with Suboxone however she is no longer on medication secondary to what she describes is mistreatment by the clinic. She needs support and will attempt to get her in contact with the Internal Medicine Teaching Services Program at [facility].  Encouraged to continue attending Narcotics Anonymous. I am concerned that drug use is also interfering with her ability to eat. Food provided. Will have her follow up in 1 week.       MSSA bacteremia   Relevant Medications   sulfamethoxazole-trimethoprim (BACTRIM DS) 800-160 MG tablet   Hardware complicating wound infection ([place])    Monica Eaton is has good adherence and tolerance of her Bactrim. No increased neck pain, fevers or chills. Has had additional disc pathology findings and  is s/p cortisone injection. No clear source of infection although remains at significantly high risk for future infection given relapse of drug use. Check inflammatory markers. Continue current dose of Bactrim.       Relevant Medications   sulfamethoxazole-trimethoprim (BACTRIM DS) 800-160 MG tablet    Other Visit Diagnoses    Cervical disc disorder with radiculopathy of cervical region    -  Primary   Relevant Medications   sulfamethoxazole-trimethoprim (BACTRIM DS) 800-160 MG tablet   Other Relevant Orders   CBC w/Diff   Comp Met (CMET)   Sedimentation rate   C-reactive protein       I am having Tereasa Coop "Chrissie" maintain her sulfamethoxazole-trimethoprim.   Meds ordered this encounter  Medications  . sulfamethoxazole-trimethoprim (BACTRIM  DS) 800-160 MG tablet    Sig: Take 1 tablet by mouth 2 (two) times daily.    Dispense:  60 tablet    Refill:  3    Order Specific Question:   Supervising Provider    Answer:   Carlyle Basques [[id number]]    A total of 35 minutes was spent on this visit with face to face time reviewing previous lab work, counseling about need for cessation of IV drug use and developing a plan of care.    Follow-up: Return in about 1 week (around 05/31/2020), or if symptoms worsen or fail to improve.   Terri Piedra, MSN, FNP-C Nurse Practitioner [facility] for Infectious Disease [place] number: [phone]

## 2020-05-24 NOTE — Assessment & Plan Note (Signed)
Monica Eaton is has good adherence and tolerance of her Bactrim. No increased neck pain, fevers or chills. Has had additional disc pathology findings and is s/p cortisone injection. No clear source of infection although remains at significantly high risk for future infection given relapse of drug use. Check inflammatory markers. Continue current dose of Bactrim.

## 2020-05-24 NOTE — Patient Instructions (Addendum)
Nice to see you.  We will check your lab work today.  Continue to take your Bactrim daily.  Refills are at the pharmacy.  Goal for 4 NA meetings over the next 2 weeks.   Plan for follow up in 1 week or sooner if needed.   Have a great day and stay safe!

## 2020-05-24 NOTE — Assessment & Plan Note (Signed)
Monica Eaton has relapsed with heroin and methamphetamine usage following treatment [facility]. Opioid use disorder treated with Suboxone however she is no longer on medication secondary to what she describes is mistreatment by the clinic. She needs support and will attempt to get her in contact with the Internal Medicine Teaching Services Program at [facility].  Encouraged to continue attending Narcotics Anonymous. I am concerned that drug use is also interfering with her ability to eat. Food provided. Will have her follow up in 1 week.

## 2020-05-24 NOTE — Assessment & Plan Note (Signed)
Monica Eaton previous blood work is consistent with chronic Hepatitis C infection and remains at high risk with continued drug use. Will work to get her stabilized and then pursue treatment for Hepatitis C.

## 2020-05-25 LAB — CBC WITH DIFFERENTIAL/PLATELET
Absolute Monocytes: 368 cells/uL (ref 200–950)
Basophils Absolute: 32 cells/uL (ref 0–200)
Basophils Relative: 0.7 %
Eosinophils Absolute: 18 cells/uL (ref 15–500)
Eosinophils Relative: 0.4 %
HCT: 40.2 % (ref 35.0–45.0)
Hemoglobin: 13.4 g/dL (ref 11.7–15.5)
Lymphs Abs: 1329 cells/uL (ref 850–3900)
MCH: 30.2 pg (ref 27.0–33.0)
MCHC: 33.3 g/dL (ref 32.0–36.0)
MCV: 90.5 fL (ref 80.0–100.0)
MPV: 9.7 fL (ref 7.5–12.5)
Monocytes Relative: 8 %
Neutro Abs: 2852 cells/uL (ref 1500–7800)
Neutrophils Relative %: 62 %
Platelets: 351 10*3/uL (ref 140–400)
RBC: 4.44 10*6/uL (ref 3.80–5.10)
RDW: 11.4 % (ref 11.0–15.0)
Total Lymphocyte: 28.9 %
WBC: 4.6 10*3/uL (ref 3.8–10.8)

## 2020-05-25 LAB — COMPREHENSIVE METABOLIC PANEL
AG Ratio: 1.5 (calc) (ref 1.0–2.5)
ALT: 100 U/L — ABNORMAL HIGH (ref 6–29)
AST: 69 U/L — ABNORMAL HIGH (ref 10–30)
Albumin: 4.3 g/dL (ref 3.6–5.1)
Alkaline phosphatase (APISO): 81 U/L (ref 31–125)
BUN: 17 mg/dL (ref 7–25)
CO2: 26 mmol/L (ref 20–32)
Calcium: 9.1 mg/dL (ref 8.6–10.2)
Chloride: 105 mmol/L (ref 98–110)
Creat: 0.54 mg/dL (ref 0.50–1.10)
Globulin: 2.8 g/dL (calc) (ref 1.9–3.7)
Glucose, Bld: 104 mg/dL — ABNORMAL HIGH (ref 65–99)
Potassium: 4.1 mmol/L (ref 3.5–5.3)
Sodium: 141 mmol/L (ref 135–146)
Total Bilirubin: 0.3 mg/dL (ref 0.2–1.2)
Total Protein: 7.1 g/dL (ref 6.1–8.1)

## 2020-05-25 LAB — SEDIMENTATION RATE: Sed Rate: 2 mm/h (ref 0–20)

## 2020-05-25 LAB — C-REACTIVE PROTEIN: CRP: 0.7 mg/L (ref <8.0)

## 2020-05-26 ENCOUNTER — Telehealth: Payer: Self-pay | Admitting: Family

## 2020-05-26 NOTE — Telephone Encounter (Signed)
Spoke with Ms. Sharps regarding her lab work that does not show any evidence of infection at this point. She will continue to follow up next week as planned and will also check her Hepatitis C RNA level to start Hepatitis treatment.   Terri Piedra, NP 05/26/2020 2:39 PM

## 2020-06-02 ENCOUNTER — Ambulatory Visit: Payer: Medicaid Other | Admitting: Family

## 2020-06-21 ENCOUNTER — Ambulatory Visit: Payer: Medicaid Other | Admitting: Family

## 2020-06-23 ENCOUNTER — Telehealth: Payer: Self-pay

## 2020-06-23 NOTE — Telephone Encounter (Signed)
-----   Message from Beryle Flock, RN sent at 06/23/2020  9:10 AM EDT ----- Regarding: reschedule Could we see if this patient can reschedule with Marya Amsler, she missed her appt this week and she needs a 30 min slot. Thank you!

## 2020-06-23 NOTE — Telephone Encounter (Signed)
Left patient a voice mail to call back to reschedule office visit missed with [facility], needs 30 min appt

## 2020-06-30 ENCOUNTER — Other Ambulatory Visit (HOSPITAL_COMMUNITY): Payer: Self-pay

## 2020-06-30 ENCOUNTER — Other Ambulatory Visit: Payer: Self-pay

## 2020-06-30 ENCOUNTER — Encounter: Payer: Self-pay | Admitting: Family

## 2020-06-30 ENCOUNTER — Ambulatory Visit (INDEPENDENT_AMBULATORY_CARE_PROVIDER_SITE_OTHER): Payer: Medicaid Other | Admitting: Family

## 2020-06-30 VITALS — BP 149/81 | HR 78 | Temp 98.3°F | Wt 131.0 lb

## 2020-06-30 DIAGNOSIS — F1990 Other psychoactive substance use, unspecified, uncomplicated: Secondary | ICD-10-CM

## 2020-06-30 DIAGNOSIS — B182 Chronic viral hepatitis C: Secondary | ICD-10-CM | POA: Diagnosis present

## 2020-06-30 DIAGNOSIS — T847XXD Infection and inflammatory reaction due to other internal orthopedic prosthetic devices, implants and grafts, subsequent encounter: Secondary | ICD-10-CM | POA: Diagnosis not present

## 2020-06-30 MED ORDER — SULFAMETHOXAZOLE-TRIMETHOPRIM 800-160 MG PO TABS: 1.0000 | ORAL_TABLET | Freq: Two times a day (BID) | ORAL | 3 refills | Status: DC

## 2020-06-30 NOTE — Assessment & Plan Note (Signed)
Monica Eaton has Genotype 1a Chronic Hepatitis C with with fibrosis score of F1. Will need updated viral load. Discussed the risks, benefits and treatment options for Hepatitis C. Risk factor is IV drug use. Asymptomatic and treatment naive. Check Hepatitis C RNA level. Discussed ideally will be off IV drugs. Plan for follow up in 1 month after starting medication. Will likely plan for treatment with [facility].

## 2020-06-30 NOTE — Progress Notes (Signed)
Subjective:    Patient ID: Monica Eaton, female    DOB: 1975/03/01, 45 y.o.   MRN: [id number]  Chief Complaint  Patient presents with  . Follow-up    Cervical disc disorder with radiculopathy of cervical region     HPI:  Monica Eaton is a 45 y.o. female s/p C3-C7 laminectomies and C3-C6 instrumented fusions with cultures positive for MSSA currently on Bactrim and positive for Hepatitis C presents today for follow up and to discuss Hepatitis C treatment.  Monica Eaton has been doing better since her last office visit. 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 Taking Bactrim twice daily as prescribed. Participating in clean needle exchange program. Neck is doing well with no changes in pain and denies radiculopathy. Denies abdominal pain, nausea, vomiting, slceral icterus or jaundice.  Allergies  Allergen Reactions  . Gabapentin Other (See Comments)    Causes extreme agitation.   . Cefaclor   . Cephalosporins   . Nitrofurantoin   . Penicillins     Spoke with patient's family. They report that Monica Eaton had a rash/hives when she was 6-26 years old. She did not have any shortness of breath and they do not recall having to take her to the doctor's office or hospital to treat the reaction.       Outpatient Medications Prior to Visit  Medication Sig Dispense Refill  . sulfamethoxazole-trimethoprim (BACTRIM DS) 800-160 MG tablet Take 1 tablet by mouth 2 (two) times daily. 60 tablet 3   No facility-administered medications prior to visit.     Past Medical History:  Diagnosis Date  . Alcohol abuse   . IV drug user    heroin  . Neck pain, chronic   . Neuropathy      Past Surgical History:  Procedure Laterality Date  . BREAST ENHANCEMENT SURGERY    . CESAREAN SECTION    . FINGER FRACTURE SURGERY Left    left ring finger fracture.  [facility] RADIOLOGY WITH ANESTHESIA N/A 08/08/2019   Procedure: MRI WITH ANESTHESIA;  Surgeon: Radiologist, Medication, MD;  Location: [facility];   Service: Radiology;  Laterality: N/A;  . WISDOM TOOTH EXTRACTION         Review of Systems  Constitutional: Negative for chills, fatigue, fever and unexpected weight change.  Respiratory: Negative for cough, chest tightness, shortness of breath and wheezing.   Cardiovascular: Negative for chest pain and leg swelling.  Gastrointestinal: Negative for abdominal distention, constipation, diarrhea, nausea and vomiting.  Neurological: Negative for dizziness, weakness, light-headedness and headaches.  Hematological: Does not bruise/bleed easily.      Objective:    BP (!) 149/81   Pulse 78   Temp 98.3 F (36.8 C) (Oral)   Wt 131 lb (59.4 kg)   BMI 20.52 kg/m  Nursing note and vital signs reviewed.  Physical Exam Constitutional:      General: She is not in acute distress.    Appearance: She is well-developed.  Cardiovascular:     Rate and Rhythm: Normal rate and regular rhythm.     Heart sounds: Normal heart sounds. No murmur heard. No friction rub. No gallop.   Pulmonary:     Effort: Pulmonary effort is normal. No respiratory distress.     Breath sounds: Normal breath sounds. No wheezing or rales.  Chest:     Chest wall: No tenderness.  Abdominal:     General: Bowel sounds are normal. There is no distension.     Palpations: Abdomen is soft. There is no  mass.     Tenderness: There is no abdominal tenderness. There is no guarding or rebound.  Skin:    General: Skin is warm and dry.  Neurological:     Mental Status: She is alert and oriented to person, place, and time.  Psychiatric:        Behavior: Behavior normal.        Thought Content: Thought content normal.        Judgment: Judgment normal.      Depression screen PHQ 2/9 09/30/2019  Decreased Interest 1  Down, Depressed, Hopeless 1  PHQ - 2 Score 2  Altered sleeping 2  Tired, decreased energy 2  Change in appetite 1  Feeling bad or failure about yourself  3  Trouble concentrating 1  Moving slowly or  fidgety/restless 0  Suicidal thoughts 0  PHQ-9 Score 11       Assessment & Plan:    Patient Active Problem List   Diagnosis Date Noted  . LFT elevation   . Chronic hepatitis C with hepatic coma ([place])   . Hepatitis   . Malnutrition of moderate degree 08/19/2019  . Epidural abscess   . Discitis of cervical region   . Hardware complicating wound infection ([place])   . Bandemia   . MSSA bacteremia   . Cervical pain (neck) 08/08/2019  . IVDU (intravenous drug user) 08/08/2019  . Tobacco abuse 08/08/2019  . Neck pain 08/08/2019     Problem List Items Addressed This Visit   None      I am having Monica Coop "Chrissie" maintain her sulfamethoxazole-trimethoprim.   No orders of the defined types were placed in this encounter.    Follow-up: No follow-ups on file.   Terri Piedra, MSN, FNP-C Nurse Practitioner [facility] for Infectious Disease [place] number: [phone]

## 2020-06-30 NOTE — Patient Instructions (Signed)
Nice to see you.  We will check your lab work today.  Once we get the results will get you started on medication.  Plan for follow up 1 month after starting medication.   Have a great day and stay safe!

## 2020-06-30 NOTE — Assessment & Plan Note (Signed)
Continues to use fentanyl and methamphetamine. Participating in clean needle exchange. Discussed importance of seeking counseling. She is currently in the pre-contemplation stage and not likely to quit at the present time. Continue to monitor.

## 2020-06-30 NOTE — Assessment & Plan Note (Signed)
Stable with current dose of Bactrim. Remains at high risk for re-infection given continued IV drug use. No clear signs of infection at present. Continue current dose of Bactrim.

## 2020-07-03 LAB — HEPATITIS C RNA QUANTITATIVE
HCV Quantitative Log: 6.52 log IU/mL — ABNORMAL HIGH
HCV RNA, PCR, QN: 3290000 IU/mL — ABNORMAL HIGH

## 2020-07-06 ENCOUNTER — Other Ambulatory Visit (HOSPITAL_COMMUNITY): Payer: Self-pay

## 2020-07-06 ENCOUNTER — Other Ambulatory Visit: Payer: Self-pay | Admitting: Family

## 2020-07-06 ENCOUNTER — Telehealth: Payer: Self-pay

## 2020-07-06 MED ORDER — MAVYRET 100-40 MG PO TABS
3.0000 | ORAL_TABLET | Freq: Every day | ORAL | 1 refills | Status: DC
  Filled 2020-07-06: qty 84, fill #0
  Filled 2020-07-21: qty 84, 28d supply, fill #0
  Filled 2020-08-19: qty 84, 28d supply, fill #1

## 2020-07-06 NOTE — Telephone Encounter (Signed)
[facility] Patient Advocate Encounter   Received notification from [place] Medicaid that prior authorization for Mavyret is required.   PA submitted on 07/06/20 [place] [id number] Status is pending    [facility] will continue to follow.   Ileene Patrick, [facility] Patient [facility] for Infectious Disease Phone: [phone] Fax:  [phone]

## 2020-07-09 ENCOUNTER — Other Ambulatory Visit (HOSPITAL_COMMUNITY): Payer: Self-pay

## 2020-07-12 ENCOUNTER — Telehealth: Payer: Self-pay

## 2020-07-12 NOTE — Telephone Encounter (Signed)
[facility] Patient Advocate Encounter  Prior Authorization for [place] has been approved.    PA# [id number] Effective dates: 07/06/20 through 08/31/20  Patients co-pay is $3.00.   [facility] will continue to follow.  Ileene Patrick, [facility] Patient [facility] for Infectious Disease Phone: [phone] Fax:  [phone]

## 2020-07-15 ENCOUNTER — Other Ambulatory Visit (HOSPITAL_COMMUNITY): Payer: Self-pay

## 2020-07-19 ENCOUNTER — Other Ambulatory Visit (HOSPITAL_COMMUNITY): Payer: Self-pay

## 2020-07-19 ENCOUNTER — Telehealth: Payer: Self-pay

## 2020-07-19 NOTE — Telephone Encounter (Signed)
[facility] Patient Advocate Encounter   I have been unsuccsessful in reaching patient to be able to refill (Mavyret) medication.    We have tried multiple times without a response.  Ileene Patrick, [facility] Patient [facility] for Infectious Disease Phone: [phone] Fax:  [phone]

## 2020-07-21 ENCOUNTER — Other Ambulatory Visit (HOSPITAL_COMMUNITY): Payer: Self-pay

## 2020-07-29 ENCOUNTER — Ambulatory Visit: Payer: Medicaid Other | Admitting: Family

## 2020-08-18 ENCOUNTER — Encounter: Payer: Self-pay | Admitting: Family

## 2020-08-18 ENCOUNTER — Ambulatory Visit (INDEPENDENT_AMBULATORY_CARE_PROVIDER_SITE_OTHER): Payer: Medicaid Other | Admitting: Family

## 2020-08-18 ENCOUNTER — Other Ambulatory Visit: Payer: Self-pay

## 2020-08-18 VITALS — BP 134/78 | HR 99 | Temp 98.6°F | Wt 139.8 lb

## 2020-08-18 DIAGNOSIS — B182 Chronic viral hepatitis C: Secondary | ICD-10-CM

## 2020-08-18 NOTE — Progress Notes (Signed)
Subjective:    Patient ID: Carah Barrientes, female    DOB: 1975/10/03, 45 y.o.   MRN: [id number]  Chief Complaint  Patient presents with   Hepatitis C     HPI:  Kashawna Manzer is a 45 y.o. female with Chronic Hepatitis C last seen on 5/25 and started on Mavyret presents today for 1 month follow up after starting medication.  Ms. Berberich has been taking her Mavyret as prescribed daily. Has noted increased amounts of gassiness since starting the medication which has improved as she continues to take it. No abdominal pain, nausea, vomiting, diarrhea, scleral icterus or jaundice. Does continue to use heroin.   Allergies  Allergen Reactions   Gabapentin Other (See Comments)    Causes extreme agitation.    Cefaclor    Cephalosporins    Nitrofurantoin    Penicillins     Spoke with patient's family. They report that Ms. Waguespack had a rash/hives when she was 42-46 years old. She did not have any shortness of breath and they do not recall having to take her to the doctor's office or hospital to treat the reaction.       Outpatient Medications Prior to Visit  Medication Sig Dispense Refill   Glecaprevir-Pibrentasvir (MAVYRET) 100-40 MG TABS Take 3 tablets by mouth daily. Take 3 tablets by mouth daily with a meal. 84 tablet 1   sulfamethoxazole-trimethoprim (BACTRIM DS) 800-160 MG tablet Take 1 tablet by mouth 2 (two) times daily. 60 tablet 3   No facility-administered medications prior to visit.     Past Medical History:  Diagnosis Date   Alcohol abuse    IV drug user    heroin   Neck pain, chronic    Neuropathy      Past Surgical History:  Procedure Laterality Date   BREAST ENHANCEMENT SURGERY     CESAREAN SECTION     FINGER FRACTURE SURGERY Left    left ring finger fracture.   RADIOLOGY WITH ANESTHESIA N/A 08/08/2019   Procedure: MRI WITH ANESTHESIA;  Surgeon: Radiologist, Medication, MD;  Location: [facility];  Service: Radiology;  Laterality: N/A;   WISDOM TOOTH  EXTRACTION         Review of Systems  Constitutional:  Negative for chills, diaphoresis, fatigue, fever and unexpected weight change.  Respiratory:  Negative for cough, chest tightness, shortness of breath and wheezing.   Cardiovascular:  Negative for chest pain and leg swelling.  Gastrointestinal:  Negative for abdominal distention, abdominal pain, constipation, diarrhea, nausea and vomiting.  Neurological:  Negative for dizziness, weakness, light-headedness and headaches.  Hematological:  Does not bruise/bleed easily.     Objective:    BP 134/78   Pulse 99   Temp 98.6 F (37 C) (Oral)   Wt 139 lb 12.8 oz (63.4 kg)   SpO2 99%   BMI 21.90 kg/m  Nursing note and vital signs reviewed.  Physical Exam Constitutional:      General: She is not in acute distress.    Appearance: She is well-developed.  Cardiovascular:     Rate and Rhythm: Normal rate and regular rhythm.     Heart sounds: Normal heart sounds. No murmur heard.   No friction rub. No gallop.  Pulmonary:     Effort: Pulmonary effort is normal. No respiratory distress.     Breath sounds: Normal breath sounds. No wheezing or rales.  Chest:     Chest wall: No tenderness.  Abdominal:     General: Bowel sounds are normal. There  is no distension.     Palpations: Abdomen is soft. There is no mass.     Tenderness: There is no abdominal tenderness. There is no guarding or rebound.  Skin:    General: Skin is warm and dry.  Neurological:     Mental Status: She is alert and oriented to person, place, and time.  Psychiatric:        Behavior: Behavior normal.        Thought Content: Thought content normal.        Judgment: Judgment normal.     Depression screen PHQ 2/9 09/30/2019  Decreased Interest 1  Down, Depressed, Hopeless 1  PHQ - 2 Score 2  Altered sleeping 2  Tired, decreased energy 2  Change in appetite 1  Feeling bad or failure about yourself  3  Trouble concentrating 1  Moving slowly or fidgety/restless  0  Suicidal thoughts 0  PHQ-9 Score 11       Assessment & Plan:    Patient Active Problem List   Diagnosis Date Noted   LFT elevation    Chronic hepatitis C with hepatic coma (HCC)    Hepatitis    Malnutrition of moderate degree 08/19/2019   Epidural abscess    Discitis of cervical region    Hardware complicating wound infection ([place])    Bandemia    MSSA bacteremia    Cervical pain (neck) 08/08/2019   IVDU (intravenous drug user) 08/08/2019   Tobacco abuse 08/08/2019   Neck pain 08/08/2019     Problem List Items Addressed This Visit       Digestive   Chronic hepatitis C with hepatic coma ([place]) - Primary    Ms. Abplanalp has completed her 1st month of [place] with minimal adverse side effects and good adherence. Check Hepatitis C RNA level. Discussed plan of care to continue current dose of Mavyret for 1 month. I do have concern given her continued use of drugs that she has a high risk of relapse. Plan for follow up in 1 month or sooner if needed.        Relevant Orders   Hepatitis C RNA quantitative   Basic metabolic panel     I am having Tereasa Coop "Chrissie" maintain her sulfamethoxazole-trimethoprim and Mavyret.   Follow-up: Return in about 1 month (around 09/18/2020), or if symptoms worsen or fail to improve.   Terri Piedra, MSN, FNP-C Nurse Practitioner [facility] for Infectious Disease [place] number: [phone]

## 2020-08-18 NOTE — Patient Instructions (Signed)
Nice to see you.  We will check your lab work today.  Continue to take your medication as prescribed.  Plan for follow up in 1 month or sooner if needed.   [facility] [phone] for an appointment. Let me know if you have any challenges.   Have a great day and stay safe!

## 2020-08-18 NOTE — Assessment & Plan Note (Signed)
Monica Eaton has completed her 69st month of [place] with minimal adverse side effects and good adherence. Check Hepatitis C RNA level. Discussed plan of care to continue current dose of Mavyret for 1 month. I do have concern given her continued use of drugs that she has a high risk of relapse. Plan for follow up in 1 month or sooner if needed.

## 2020-08-19 ENCOUNTER — Other Ambulatory Visit (HOSPITAL_COMMUNITY): Payer: Self-pay

## 2020-08-20 LAB — BASIC METABOLIC PANEL
BUN: 15 mg/dL (ref 7–25)
CO2: 28 mmol/L (ref 20–32)
Calcium: 9.3 mg/dL (ref 8.6–10.2)
Chloride: 102 mmol/L (ref 98–110)
Creat: 0.7 mg/dL (ref 0.50–0.99)
Glucose, Bld: 84 mg/dL (ref 65–99)
Potassium: 4.3 mmol/L (ref 3.5–5.3)
Sodium: 138 mmol/L (ref 135–146)

## 2020-08-20 LAB — HEPATITIS C RNA QUANTITATIVE
HCV Quantitative Log: 1.33 log IU/mL — ABNORMAL HIGH
HCV RNA, PCR, QN: 21 IU/mL — ABNORMAL HIGH

## 2020-09-07 ENCOUNTER — Other Ambulatory Visit (HOSPITAL_COMMUNITY): Payer: Self-pay

## 2020-09-13 ENCOUNTER — Other Ambulatory Visit (HOSPITAL_COMMUNITY): Payer: Self-pay

## 2020-09-22 ENCOUNTER — Other Ambulatory Visit: Payer: Self-pay

## 2020-09-22 ENCOUNTER — Ambulatory Visit: Payer: Medicaid Other | Admitting: Family

## 2020-09-22 ENCOUNTER — Encounter: Payer: Self-pay | Admitting: Family

## 2020-09-22 VITALS — BP 105/67 | HR 90 | Temp 98.4°F | Wt 152.0 lb

## 2020-09-22 DIAGNOSIS — B182 Chronic viral hepatitis C: Secondary | ICD-10-CM | POA: Diagnosis present

## 2020-09-22 DIAGNOSIS — T847XXD Infection and inflammatory reaction due to other internal orthopedic prosthetic devices, implants and grafts, subsequent encounter: Secondary | ICD-10-CM

## 2020-09-22 DIAGNOSIS — F1990 Other psychoactive substance use, unspecified, uncomplicated: Secondary | ICD-10-CM | POA: Diagnosis not present

## 2020-09-22 MED ORDER — SULFAMETHOXAZOLE-TRIMETHOPRIM 800-160 MG PO TABS: 1.0000 | ORAL_TABLET | Freq: Two times a day (BID) | ORAL | 4 refills | Status: DC

## 2020-09-22 NOTE — Progress Notes (Signed)
Subjective:    Patient ID: Monica Eaton, female    DOB: 07-28-1975, 45 y.o.   MRN: [id number]  Chief Complaint  Patient presents with   Follow-up    Hep C     HPI:  Monica Eaton is a 45 y.o. female with chronic hepatitis C and previous MSSA infection complicated by bacteremia last seen on 08/18/2020 for follow-up.  Hepatitis C RNA level was undetectable.  Here today for routine follow-up.  Monica Eaton has completed taking her [place] as prescribed with no adverse side effects or missed doses since her last office visit.  Overall feeling well today with no new concerns/complaints.  She is interested in seeking care for possible Suboxone. Denies fevers, chills, night sweats, headaches, changes in vision, nausea, diarrhea, vomiting, lesions or rashes.  Does have neck pain with radiculopathy on occasion.  She is taking her Bactrim as prescribed.   Allergies  Allergen Reactions   Gabapentin Other (See Comments)    Causes extreme agitation.    Cefaclor    Cephalosporins    Nitrofurantoin    Penicillins     Spoke with patient's family. They report that Monica Eaton had a rash/hives when she was 85-74 years old. She did not have any shortness of breath and they do not recall having to take her to the doctor's office or hospital to treat the reaction.       Outpatient Medications Prior to Visit  Medication Sig Dispense Refill   sulfamethoxazole-trimethoprim (BACTRIM DS) 800-160 MG tablet Take 1 tablet by mouth 2 (two) times daily. 60 tablet 3   ibuprofen (ADVIL) 600 MG tablet Take 600 mg by mouth 3 (three) times daily.     Glecaprevir-Pibrentasvir (MAVYRET) 100-40 MG TABS Take 3 tablets by mouth daily. Take 3 tablets by mouth daily with a meal. (Patient not taking: Reported on 09/22/2020) 84 tablet 1   No facility-administered medications prior to visit.     Past Medical History:  Diagnosis Date   Alcohol abuse    IV drug user    heroin   Neck pain, chronic    Neuropathy       Past Surgical History:  Procedure Laterality Date   BREAST ENHANCEMENT SURGERY     CESAREAN SECTION     FINGER FRACTURE SURGERY Left    left ring finger fracture.   RADIOLOGY WITH ANESTHESIA N/A 08/08/2019   Procedure: MRI WITH ANESTHESIA;  Surgeon: Radiologist, Medication, MD;  Location: [facility];  Service: Radiology;  Laterality: N/A;   WISDOM TOOTH EXTRACTION         Review of Systems  Constitutional:  Negative for chills, fatigue, fever and unexpected weight change.  Respiratory:  Negative for cough, chest tightness, shortness of breath and wheezing.   Cardiovascular:  Negative for chest pain and leg swelling.  Gastrointestinal:  Negative for abdominal distention, constipation, diarrhea, nausea and vomiting.  Neurological:  Negative for dizziness, weakness, light-headedness and headaches.  Hematological:  Does not bruise/bleed easily.     Objective:    BP 105/67   Pulse 90   Temp 98.4 F (36.9 C) (Oral)   Wt 152 lb (68.9 kg)   SpO2 99%   BMI 23.81 kg/m  Nursing note and vital signs reviewed.  Physical Exam Constitutional:      General: She is not in acute distress.    Appearance: She is well-developed.  Cardiovascular:     Rate and Rhythm: Normal rate and regular rhythm.     Heart sounds: Normal heart  sounds. No murmur heard.   No friction rub. No gallop.  Pulmonary:     Effort: Pulmonary effort is normal. No respiratory distress.     Breath sounds: Normal breath sounds. No wheezing or rales.  Chest:     Chest wall: No tenderness.  Abdominal:     General: Bowel sounds are normal. There is no distension.     Palpations: Abdomen is soft. There is no mass.     Tenderness: There is no abdominal tenderness. There is no guarding or rebound.  Skin:    General: Skin is warm and dry.  Neurological:     Mental Status: She is alert and oriented to person, place, and time.  Psychiatric:        Behavior: Behavior normal.        Thought Content: Thought content  normal.        Judgment: Judgment normal.     Depression screen PHQ 2/9 09/30/2019  Decreased Interest 1  Down, Depressed, Hopeless 1  PHQ - 2 Score 2  Altered sleeping 2  Tired, decreased energy 2  Change in appetite 1  Feeling bad or failure about yourself  3  Trouble concentrating 1  Moving slowly or fidgety/restless 0  Suicidal thoughts 0  PHQ-9 Score 11       Assessment & Plan:    Patient Active Problem List   Diagnosis Date Noted   LFT elevation    Chronic hepatitis C with hepatic coma (HCC)    Hepatitis    Malnutrition of moderate degree 08/19/2019   Epidural abscess    Discitis of cervical region    Hardware complicating wound infection ([place])    Bandemia    MSSA bacteremia    Cervical pain (neck) 08/08/2019   IVDU (intravenous drug user) 08/08/2019   Tobacco abuse 08/08/2019   Neck pain 08/08/2019     Problem List Items Addressed This Visit       Digestive   Chronic hepatitis C with hepatic coma ([place]) - Primary    Ms. Firebaugh has completed treatment with Mavyret.  With no complications or adverse side effects.  Check hepatitis C RNA level.  Discussed plan of care to include rechecking viral load in 3 months to determine SVR 12.      Relevant Medications   sulfamethoxazole-trimethoprim (BACTRIM DS) 800-160 MG tablet     Other   Hardware complicating wound infection ([place])    Ms. Kops continues to take her Bactrim as prescribed and suppression of her previous MSSA infection.  No signs of flare or active infection at present.  Check CRP and ESR today.  Continue current dose of Bactrim.      Relevant Medications   sulfamethoxazole-trimethoprim (BACTRIM DS) 800-160 MG tablet   Other Relevant Orders   Sedimentation rate (Completed)   C-reactive protein     I have discontinued Tereasa Coop "Chrissie"'s Mavyret. I am also having her maintain her ibuprofen and sulfamethoxazole-trimethoprim.   Meds ordered this encounter  Medications    sulfamethoxazole-trimethoprim (BACTRIM DS) 800-160 MG tablet    Sig: Take 1 tablet by mouth 2 (two) times daily.    Dispense:  60 tablet    Refill:  4    Order Specific Question:   Supervising Provider    Answer:   Carlyle Basques [[id number]]     Follow-up: In 3 months or sooner if needed.    Terri Piedra, MSN, FNP-C Nurse Practitioner [facility] for Infectious Disease [place]  number: [phone]

## 2020-09-22 NOTE — Patient Instructions (Addendum)
Nice to see you.  We will check your lab work today.   Continue to take your medication.  Refills have been sent to the pharmacy.  Plan for follow up in 3 months or sooner if needed.  Contact the Internal Medicine Center at [facility] for an appointment [phone].   Have a great day and stay safe!

## 2020-09-23 ENCOUNTER — Encounter: Payer: Self-pay | Admitting: Family

## 2020-09-23 LAB — SEDIMENTATION RATE: Sed Rate: 9 mm/h (ref 0–20)

## 2020-09-23 LAB — C-REACTIVE PROTEIN: CRP: 1.7 mg/L (ref <8.0)

## 2020-09-23 NOTE — Assessment & Plan Note (Signed)
Monica Eaton continues to take her Bactrim as prescribed and suppression of her previous MSSA infection.  No signs of flare or active infection at present.  Check CRP and ESR today.  Continue current dose of Bactrim.

## 2020-09-23 NOTE — Assessment & Plan Note (Signed)
Monica Eaton has completed treatment with Mavyret.  With no complications or adverse side effects.  Check hepatitis C RNA level.  Discussed plan of care to include rechecking viral load in 3 months to determine SVR 12.

## 2020-09-23 NOTE — Assessment & Plan Note (Signed)
Interested in [facility] for opoid use disorder. Information for [facility] provided to establish care.

## 2020-09-24 ENCOUNTER — Other Ambulatory Visit: Payer: Self-pay | Admitting: Family

## 2020-09-24 DIAGNOSIS — B182 Chronic viral hepatitis C: Secondary | ICD-10-CM

## 2020-09-24 NOTE — Telephone Encounter (Signed)
Lab appointment scheduled for 09/27/20.   Beryle Flock, RN

## 2020-09-27 ENCOUNTER — Other Ambulatory Visit: Payer: Self-pay

## 2020-09-27 ENCOUNTER — Other Ambulatory Visit: Payer: Medicaid Other

## 2020-09-27 DIAGNOSIS — B182 Chronic viral hepatitis C: Secondary | ICD-10-CM

## 2020-09-29 LAB — HEPATITIS C RNA QUANTITATIVE
HCV Quantitative Log: <1.18 log IU/mL
HCV RNA, PCR, QN: <15 IU/mL

## 2020-11-17 ENCOUNTER — Other Ambulatory Visit: Payer: Self-pay

## 2020-11-17 ENCOUNTER — Encounter: Payer: Self-pay | Admitting: Family

## 2020-11-17 ENCOUNTER — Ambulatory Visit: Payer: Medicaid Other | Admitting: Family

## 2020-11-17 VITALS — BP 146/86 | HR 99 | Temp 98.3°F | Wt 160.0 lb

## 2020-11-17 DIAGNOSIS — R600 Localized edema: Secondary | ICD-10-CM | POA: Diagnosis present

## 2020-11-17 MED ORDER — IBUPROFEN 600 MG PO TABS: 600.0000 mg | ORAL_TABLET | Freq: Three times a day (TID) | ORAL | 0 refills | Status: DC | PRN

## 2020-11-17 MED ORDER — FUROSEMIDE 20 MG PO TABS: 20.0000 mg | ORAL_TABLET | Freq: Every day | ORAL | 0 refills | Status: DC | PRN

## 2020-11-17 NOTE — Assessment & Plan Note (Signed)
Monica Eaton has new onset bilateral lower extremity edema of unclear origin. She has gained about 20 pounds over the course of the last 3 months (~14%). Will check renal function. Suspect this is likely related to nutritional intake. No calf tenderness with low concern for DVT. Start 20 mg furosemide PO daily as needed. If symptoms worsen or do not improve will consider 2D echo. Advised to elevate legs when seated. Has follow up in 1 month.

## 2020-11-17 NOTE — Patient Instructions (Signed)
Nice to see you.  We will check lab work today.  Start taking the furosemide as needed for swelling.  If your symptoms are not improving we can consider imaging and further evaluation.   Plan for follow up as scheduled.

## 2020-11-17 NOTE — Progress Notes (Signed)
Subjective:    Patient ID: Monica Eaton, female    DOB: 1975/10/14, 45 y.o.   MRN: [id number]  Chief Complaint  Patient presents with   Follow-up    Weight gain, swelling     HPI:  Monica Eaton is a 45 y.o. female with chronic Hepatitis C and history of MSSA hardware infection on suppressive therapy presenting today for an acute office visit.   Monica Eaton has ben been experiencing weight gain and lower extremity edema for just over a month and has been refractory to OTC diuretics. She has stopped using methadone and continues to use fentanyl. No fevers, chills, or respiratory symptoms. Continues to take her Bactrim twice daily as prescribed. Has been drinking up to 5 energy drinks per day and is working on cutting back.   Allergies  Allergen Reactions   Gabapentin Other (See Comments)    Causes extreme agitation.    Cefaclor    Cephalosporins    Nitrofurantoin    Penicillins     Spoke with patient's family. They report that Monica Eaton had a rash/hives when she was 24-43 years old. She did not have any shortness of breath and they do not recall having to take her to the doctor's office or hospital to treat the reaction.       Outpatient Medications Prior to Visit  Medication Sig Dispense Refill   sulfamethoxazole-trimethoprim (BACTRIM DS) 800-160 MG tablet Take 1 tablet by mouth 2 (two) times daily. 60 tablet 4   ibuprofen (ADVIL) 600 MG tablet Take 600 mg by mouth 3 (three) times daily.     No facility-administered medications prior to visit.     Past Medical History:  Diagnosis Date   Alcohol abuse    IV drug user    heroin   Neck pain, chronic    Neuropathy      Past Surgical History:  Procedure Laterality Date   BREAST ENHANCEMENT SURGERY     CESAREAN SECTION     FINGER FRACTURE SURGERY Left    left ring finger fracture.   RADIOLOGY WITH ANESTHESIA N/A 08/08/2019   Procedure: MRI WITH ANESTHESIA;  Surgeon: Radiologist, Medication, MD;  Location: [facility]  OR;  Service: Radiology;  Laterality: N/A;   WISDOM TOOTH EXTRACTION         Review of Systems  Constitutional:  Positive for unexpected weight change. Negative for chills, fatigue and fever.  Respiratory:  Negative for cough, chest tightness, shortness of breath and wheezing.   Cardiovascular:  Positive for leg swelling. Negative for chest pain.  Gastrointestinal:  Negative for abdominal distention, constipation, diarrhea, nausea and vomiting.  Neurological:  Negative for dizziness, weakness, light-headedness and headaches.  Hematological:  Does not bruise/bleed easily.     Objective:    BP (!) 146/86   Pulse 99   Temp 98.3 F (36.8 C) (Oral)   Wt 160 lb (72.6 kg)   SpO2 97%   BMI 25.06 kg/m  Nursing note and vital signs reviewed.   Wt Readings from Last 3 Encounters:  11/17/20 160 lb (72.6 kg)  09/22/20 152 lb (68.9 kg)  08/18/20 139 lb 12.8 oz (63.4 kg)    Physical Exam Constitutional:      General: She is not in acute distress.    Appearance: She is well-developed.     Comments: Seated in the chair; pleasant.   Cardiovascular:     Rate and Rhythm: Normal rate and regular rhythm.     Heart sounds: Normal heart sounds. No  murmur heard.   No friction rub. No gallop.     Comments: Bilateral lower extremity 2+ pitting edema. No calf tenderness. Distal pulses and sensation intact and appropriate.  Pulmonary:     Effort: Pulmonary effort is normal. No respiratory distress.     Breath sounds: Normal breath sounds. No wheezing or rales.  Chest:     Chest wall: No tenderness.  Abdominal:     General: Bowel sounds are normal. There is no distension.     Palpations: Abdomen is soft. There is no mass.     Tenderness: There is no abdominal tenderness. There is no guarding or rebound.  Skin:    General: Skin is warm and dry.  Neurological:     Mental Status: She is alert and oriented to person, place, and time.  Psychiatric:        Behavior: Behavior normal.         Thought Content: Thought content normal.        Judgment: Judgment normal.     Depression screen PHQ 2/9 09/30/2019  Decreased Interest 1  Down, Depressed, Hopeless 1  PHQ - 2 Score 2  Altered sleeping 2  Tired, decreased energy 2  Change in appetite 1  Feeling bad or failure about yourself  3  Trouble concentrating 1  Moving slowly or fidgety/restless 0  Suicidal thoughts 0  PHQ-9 Score 11       Assessment & Plan:    Patient Active Problem List   Diagnosis Date Noted   Lower extremity edema 11/17/2020   LFT elevation    Chronic hepatitis C with hepatic coma (HCC)    Hepatitis    Malnutrition of moderate degree 08/19/2019   Epidural abscess    Discitis of cervical region    Hardware complicating wound infection ([place])    Bandemia    MSSA bacteremia    Cervical pain (neck) 08/08/2019   IVDU (intravenous drug user) 08/08/2019   Tobacco abuse 08/08/2019   Neck pain 08/08/2019     Problem List Items Addressed This Visit       Other   Lower extremity edema - Primary    Monica Eaton has new onset bilateral lower extremity edema of unclear origin. She has gained about 20 pounds over the course of the last 3 months (~14%). Will check renal function. Suspect this is likely related to nutritional intake. No calf tenderness with low concern for DVT. Start 20 mg furosemide PO daily as needed. If symptoms worsen or do not improve will consider 2D echo. Advised to elevate legs when seated. Has follow up in 1 month.       Relevant Medications   furosemide (LASIX) 20 MG tablet   Other Relevant Orders   Comp Met (CMET)     I am having Monica Coop "Chrissie" start on furosemide. I am also having her maintain her sulfamethoxazole-trimethoprim.   Meds ordered this encounter  Medications   furosemide (LASIX) 20 MG tablet    Sig: Take 1 tablet (20 mg total) by mouth daily as needed for edema.    Dispense:  10 tablet    Refill:  0    Order Specific Question:   Supervising  Provider    Answer:   Carlyle Basques [[id number]]     Follow-up: Return in about 1 month (around 12/18/2020), or if symptoms worsen or fail to improve.   Terri Piedra, MSN, FNP-C Nurse Practitioner [facility] for Infectious Disease [place] Main number:  [phone]   

## 2020-11-18 LAB — COMPREHENSIVE METABOLIC PANEL
AG Ratio: 1.8 (calc) (ref 1.0–2.5)
ALT: 15 U/L (ref 6–29)
AST: 19 U/L (ref 10–30)
Albumin: 4.2 g/dL (ref 3.6–5.1)
Alkaline phosphatase (APISO): 101 U/L (ref 31–125)
BUN: 14 mg/dL (ref 7–25)
CO2: 30 mmol/L (ref 20–32)
Calcium: 9.2 mg/dL (ref 8.6–10.2)
Chloride: 102 mmol/L (ref 98–110)
Creat: 0.72 mg/dL (ref 0.50–0.99)
Globulin: 2.4 g/dL (calc) (ref 1.9–3.7)
Glucose, Bld: 95 mg/dL (ref 65–99)
Potassium: 4.4 mmol/L (ref 3.5–5.3)
Sodium: 137 mmol/L (ref 135–146)
Total Bilirubin: 0.2 mg/dL (ref 0.2–1.2)
Total Protein: 6.6 g/dL (ref 6.1–8.1)

## 2020-12-16 ENCOUNTER — Ambulatory Visit: Payer: Medicaid Other | Admitting: Family

## 2021-01-03 ENCOUNTER — Telehealth: Payer: Self-pay

## 2021-01-03 NOTE — Telephone Encounter (Signed)
Called patient to get missed appointment rescheduled, left patient a voicemail to call back to get the appointment rescheduled.

## 2021-01-05 ENCOUNTER — Other Ambulatory Visit: Payer: Self-pay

## 2021-01-05 ENCOUNTER — Ambulatory Visit: Payer: Medicaid Other | Admitting: Family

## 2021-01-05 ENCOUNTER — Encounter: Payer: Self-pay | Admitting: Family

## 2021-01-05 VITALS — BP 120/83 | HR 74 | Resp 16 | Ht 67.0 in | Wt 160.0 lb

## 2021-01-05 DIAGNOSIS — M4642 Discitis, unspecified, cervical region: Secondary | ICD-10-CM | POA: Diagnosis not present

## 2021-01-05 DIAGNOSIS — F1990 Other psychoactive substance use, unspecified, uncomplicated: Secondary | ICD-10-CM | POA: Diagnosis not present

## 2021-01-05 DIAGNOSIS — Z23 Encounter for immunization: Secondary | ICD-10-CM | POA: Diagnosis not present

## 2021-01-05 DIAGNOSIS — B182 Chronic viral hepatitis C: Secondary | ICD-10-CM | POA: Diagnosis present

## 2021-01-05 DIAGNOSIS — T847XXD Infection and inflammatory reaction due to other internal orthopedic prosthetic devices, implants and grafts, subsequent encounter: Secondary | ICD-10-CM

## 2021-01-05 MED ORDER — IBUPROFEN 600 MG PO TABS: 600.0000 mg | ORAL_TABLET | Freq: Three times a day (TID) | ORAL | 0 refills | Status: DC | PRN

## 2021-01-05 MED ORDER — SULFAMETHOXAZOLE-TRIMETHOPRIM 800-160 MG PO TABS: 1.0000 | ORAL_TABLET | Freq: Two times a day (BID) | ORAL | 4 refills | Status: DC

## 2021-01-05 NOTE — Progress Notes (Signed)
Subjective:    Patient ID: Monica Eaton, female    DOB: 1975-04-18, 45 y.o.   MRN: [id number]  Chief Complaint  Patient presents with   Hepatitis C   MSSA infection     HPI:  Monica Eaton is a 45 y.o. female with chronic hepatitis C and history of MSSA hardware infection on suppressive therapy with Bactrim presenting today for follow-up.  Monica Eaton has had a relapse of drug use since her last office visit and increased levels of stress and anxiety.  She continues to take her Bactrim as prescribed with no changes in neck pain although continues to have symptoms in her extremities.  No current abdominal pain, nausea, vomiting, diarrhea, scleral icterus, or jaundice.  Denies any systemic symptoms of fevers or chills.   Allergies  Allergen Reactions   Gabapentin Other (See Comments)    Causes extreme agitation.    Cefaclor    Cephalosporins    Nitrofurantoin    Penicillins     Spoke with patient's family. They report that Ms. Garmon had a rash/hives when she was 70-37 years old. She did not have any shortness of breath and they do not recall having to take her to the doctor's office or hospital to treat the reaction.       Outpatient Medications Prior to Visit  Medication Sig Dispense Refill   ibuprofen (ADVIL) 600 MG tablet Take 1 tablet (600 mg total) by mouth 3 (three) times daily as needed. No refills. 30 tablet 0   sulfamethoxazole-trimethoprim (BACTRIM DS) 800-160 MG tablet Take 1 tablet by mouth 2 (two) times daily. 60 tablet 4   furosemide (LASIX) 20 MG tablet Take 1 tablet (20 mg total) by mouth daily as needed for edema. (Patient not taking: Reported on 01/05/2021) 10 tablet 0   No facility-administered medications prior to visit.     Past Medical History:  Diagnosis Date   Alcohol abuse    IV drug user    heroin   Neck pain, chronic    Neuropathy      Past Surgical History:  Procedure Laterality Date   BREAST ENHANCEMENT SURGERY     CESAREAN  SECTION     FINGER FRACTURE SURGERY Left    left ring finger fracture.   RADIOLOGY WITH ANESTHESIA N/A 08/08/2019   Procedure: MRI WITH ANESTHESIA;  Surgeon: Radiologist, Medication, MD;  Location: [facility];  Service: Radiology;  Laterality: N/A;   WISDOM TOOTH EXTRACTION         Review of Systems  Constitutional:  Negative for chills, diaphoresis, fatigue and fever.  Respiratory:  Negative for cough, chest tightness, shortness of breath and wheezing.   Cardiovascular:  Negative for chest pain.  Gastrointestinal:  Negative for abdominal pain, diarrhea, nausea and vomiting.     Objective:    BP 120/83   Pulse 74   Resp 16   Ht _0  (1.702 m)   Wt 160 lb (72.6 kg)   SpO2 95%   BMI 25.06 kg/m  Nursing note and vital signs reviewed.  Physical Exam Constitutional:      General: She is not in acute distress.    Appearance: She is well-developed.  Cardiovascular:     Rate and Rhythm: Normal rate and regular rhythm.     Heart sounds: Normal heart sounds.  Pulmonary:     Effort: Pulmonary effort is normal.     Breath sounds: Normal breath sounds.  Skin:    General: Skin is warm and dry.  Neurological:     Mental Status: She is alert and oriented to person, place, and time.  Psychiatric:        Attention and Perception: She is inattentive.     Comments: Very restless.      Depression screen [facility] 2/9 01/05/2021 09/30/2019  Decreased Interest 2 1  Down, Depressed, Hopeless 3 1  PHQ - 2 Score 5 2  Altered sleeping 2 2  Tired, decreased energy 2 2  Change in appetite 1 1  Feeling bad or failure about yourself  0 3  Trouble concentrating 0 1  Moving slowly or fidgety/restless 0 0  Suicidal thoughts 2 0  PHQ-9 Score 12 11       Assessment & Plan:    Patient Active Problem List   Diagnosis Date Noted   Lower extremity edema 11/17/2020   LFT elevation    Chronic hepatitis C with hepatic coma (HCC)    Hepatitis    Malnutrition of moderate degree 08/19/2019   Epidural  abscess    Discitis of cervical region    Hardware complicating wound infection ([place])    Bandemia    MSSA bacteremia    Cervical pain (neck) 08/08/2019   IVDU (intravenous drug user) 08/08/2019   Tobacco abuse 08/08/2019   Neck pain 08/08/2019     Problem List Items Addressed This Visit       Digestive   Chronic hepatitis C with hepatic coma ([place]) - Primary    Ms. Foti completed treatment for hepatitis C we will check hepatitis C RNA level for sustained viremic response today.  She is at high risk for reinfection given her continued intravenous drug use.  Fibrosis score of F1 indicating low risk for significant fibrosis and additional [place] screening is not indicated.  No further treatment necessary at this point pending lab work results.      Relevant Medications   sulfamethoxazole-trimethoprim (BACTRIM DS) 800-160 MG tablet   Other Relevant Orders   Hepatitis C RNA quantitative   Comp Met (CMET)     Musculoskeletal and Integument   Discitis of cervical region   Relevant Medications   ibuprofen (ADVIL) 600 MG tablet   Other Relevant Orders   C-reactive protein   Sedimentation rate   Comp Met (CMET)     Other   IVDU (intravenous drug user)    Unfortunately Ms. Raulerson has had a relapse of her IV drug use having injected at least 2 times since her last office visit with the last being last night.  I have concern that she is continually under significant stress trying to care for her parents and per her has been hanging out with the wrong friends.      Hardware complicating wound infection ([place])    Ms. Hirschi continues to have good adherence and tolerance to her Bactrim with no changes in neck pain although continues to have symptoms in her periphery.  Check inflammatory markers and renal function for therapeutic drug monitoring.  She remains at high risk for reinfection given her continued intravenous drug use.  We will continue to monitor.      Relevant Medications    sulfamethoxazole-trimethoprim (BACTRIM DS) 800-160 MG tablet   Other Visit Diagnoses     Need for immunization against influenza       Relevant Orders   Flu Vaccine QUAD 57moIM (Fluarix, Fluzone & Alfiuria Quad PF) (Completed)        I have discontinued CTereasa Coop"Chrissie"'s furosemide. I have also changed  her ibuprofen. Additionally, I am having her maintain her sulfamethoxazole-trimethoprim.   Meds ordered this encounter  Medications   sulfamethoxazole-trimethoprim (BACTRIM DS) 800-160 MG tablet    Sig: Take 1 tablet by mouth 2 (two) times daily.    Dispense:  60 tablet    Refill:  4    Order Specific Question:   Supervising Provider    Answer:   Baxter Flattery, CYNTHIA [[id number]]   ibuprofen (ADVIL) 600 MG tablet    Sig: Take 1 tablet (600 mg total) by mouth 3 (three) times daily as needed.    Dispense:  30 tablet    Refill:  0    Order Specific Question:   Supervising Provider    Answer:   Carlyle Basques [[id number]]     Follow-up: Return in about 3 months (around 04/05/2021), or if symptoms worsen or fail to improve.   Terri Piedra, MSN, FNP-C Nurse Practitioner [facility] for Infectious Disease [place] number: [phone]

## 2021-01-05 NOTE — Assessment & Plan Note (Signed)
Monica Eaton completed treatment for hepatitis C we will check hepatitis C RNA level for sustained viremic response today.  She is at high risk for reinfection given her continued intravenous drug use.  Fibrosis score of F1 indicating low risk for significant fibrosis and additional [place] screening is not indicated.  No further treatment necessary at this point pending lab work results.

## 2021-01-05 NOTE — Patient Instructions (Addendum)
Nice to see you.  We will check your lab work today.   Continue to take your Bactrim as prescribed.   Plan for follow up in 3 months or sooner if needed.   Good luck with your interview.

## 2021-01-05 NOTE — Assessment & Plan Note (Signed)
Monica Eaton continues to have good adherence and tolerance to her Bactrim with no changes in neck pain although continues to have symptoms in her periphery.  Check inflammatory markers and renal function for therapeutic drug monitoring.  She remains at high risk for reinfection given her continued intravenous drug use.  We will continue to monitor.

## 2021-01-05 NOTE — Assessment & Plan Note (Signed)
Unfortunately Monica Eaton has had a relapse of her IV drug use having injected at least 2 times since her last office visit with the last being last night.  I have concern that she is continually under significant stress trying to care for her parents and per her has been hanging out with the wrong friends.

## 2021-01-06 LAB — COMPREHENSIVE METABOLIC PANEL
AG Ratio: 1.6 (calc) (ref 1.0–2.5)
ALT: 21 U/L (ref 6–29)
AST: 30 U/L (ref 10–30)
Albumin: 4.5 g/dL (ref 3.6–5.1)
Alkaline phosphatase (APISO): 111 U/L (ref 31–125)
BUN: 18 mg/dL (ref 7–25)
CO2: 22 mmol/L (ref 20–32)
Calcium: 9.3 mg/dL (ref 8.6–10.2)
Chloride: 103 mmol/L (ref 98–110)
Creat: 0.78 mg/dL (ref 0.50–0.99)
Globulin: 2.8 g/dL (calc) (ref 1.9–3.7)
Glucose, Bld: 90 mg/dL (ref 65–99)
Potassium: 4.4 mmol/L (ref 3.5–5.3)
Sodium: 138 mmol/L (ref 135–146)
Total Bilirubin: 0.3 mg/dL (ref 0.2–1.2)
Total Protein: 7.3 g/dL (ref 6.1–8.1)

## 2021-01-06 LAB — C-REACTIVE PROTEIN: CRP: 9.4 mg/L — ABNORMAL HIGH (ref <8.0)

## 2021-01-06 LAB — SEDIMENTATION RATE: Sed Rate: 9 mm/h (ref 0–20)

## 2021-01-06 LAB — HEPATITIS C RNA QUANTITATIVE
HCV Quantitative Log: <1.18 log IU/mL
HCV RNA, PCR, QN: <15 IU/mL

## 2021-01-12 ENCOUNTER — Encounter (HOSPITAL_COMMUNITY): Payer: Self-pay | Admitting: Oncology

## 2021-01-12 ENCOUNTER — Other Ambulatory Visit: Payer: Self-pay

## 2021-01-12 ENCOUNTER — Emergency Department (HOSPITAL_COMMUNITY)
Admission: EM | Admit: 2021-01-12 | Discharge: 2021-01-12 | Disposition: A | Discharge status: Home / Self Care | Payer: Medicaid Other | Attending: Emergency Medicine | Admitting: Emergency Medicine

## 2021-01-12 ENCOUNTER — Emergency Department (HOSPITAL_COMMUNITY): Payer: Medicaid Other

## 2021-01-12 DIAGNOSIS — S8992XA Unspecified injury of left lower leg, initial encounter: Secondary | ICD-10-CM | POA: Diagnosis present

## 2021-01-12 DIAGNOSIS — X58XXXA Exposure to other specified factors, initial encounter: Secondary | ICD-10-CM | POA: Insufficient documentation

## 2021-01-12 DIAGNOSIS — T148XXA Other injury of unspecified body region, initial encounter: Secondary | ICD-10-CM

## 2021-01-12 DIAGNOSIS — S80852A Superficial foreign body, left lower leg, initial encounter: Secondary | ICD-10-CM | POA: Diagnosis not present

## 2021-01-12 MED ORDER — TETANUS-DIPHTH-ACELL PERTUSSIS 5-2.5-18.5 LF-MCG/0.5 IM SUSY: 0.5000 mL | PREFILLED_SYRINGE | Freq: Once | INTRAMUSCULAR | Status: DC

## 2021-01-12 NOTE — ED Triage Notes (Signed)
Pt believes she has a needle broken off in her left lower leg on Friday night.  Area is warm and red.  Pt reports squeezing at the site to try and dislodge needle w/o success.

## 2021-01-12 NOTE — ED Provider Notes (Signed)
Emergency Medicine Provider Triage Evaluation Note  Malessa Zartman , a 45 y.o. female  was evaluated in triage.  Pt complains of fb in leg after using ivdu.  Review of Systems  Positive: Fb in leg Negative: fever  Physical Exam  BP 122/69 (BP Location: Right Arm)   Pulse 83   Temp 98 F (36.7 C) (Oral)   Resp 18   LMP 01/04/2021 (Approximate)   SpO2 100%  Gen:   Awake, no distress   Resp:  Normal effort  MSK:   Moves extremities without difficulty  Other:  Warmth, erythema to the lle  Medical Decision Making  Medically screening exam initiated at 4:24 PM.  Appropriate orders placed.  Brinley Rosete was informed that the remainder of the evaluation will be completed by another provider, this initial triage assessment does not replace that evaluation, and the importance of remaining in the ED until their evaluation is complete.     Rodney Booze, PA-C 01/12/21 1624    Sherwood Gambler, MD 01/13/21 [phone]

## 2021-01-12 NOTE — ED Provider Notes (Signed)
[facility] DEPT Provider Note   CSN: [id number] Arrival date & time: 01/12/21  1530     History Chief Complaint  Patient presents with   Foreign Body in Skin    Monica Eaton is a 45 y.o. female with a past medical history of IV drug use who presents to the ED complaining of foreign body and left lower extremity onset 5 days ago.  She reports that she was injecting IV drugs when she passed out.  Patient reports when she woke up she noticed that she had redness/swelling to her left ankle.  She has not tried any medications for her symptoms.  She denies fever, chills, gait issues.    Per pt chart review: Patient has been on Bactrim due to a spinal abscess due to her IV drug use.  Her most recent prescription was on 01/05/2021.     The history is provided by the patient. No language interpreter was used.      Past Medical History:  Diagnosis Date   Alcohol abuse    IV drug user    heroin   Neck pain, chronic    Neuropathy     Patient Active Problem List   Diagnosis Date Noted   Lower extremity edema 11/17/2020   LFT elevation    Chronic hepatitis C with hepatic coma (HCC)    Hepatitis    Malnutrition of moderate degree 08/19/2019   Epidural abscess    Discitis of cervical region    Hardware complicating wound infection ([place])    Bandemia    MSSA bacteremia    Cervical pain (neck) 08/08/2019   IVDU (intravenous drug user) 08/08/2019   Tobacco abuse 08/08/2019   Neck pain 08/08/2019    Past Surgical History:  Procedure Laterality Date   BREAST ENHANCEMENT SURGERY     CESAREAN SECTION     FINGER FRACTURE SURGERY Left    left ring finger fracture.   RADIOLOGY WITH ANESTHESIA N/A 08/08/2019   Procedure: MRI WITH ANESTHESIA;  Surgeon: Radiologist, Medication, MD;  Location: [facility];  Service: Radiology;  Laterality: N/A;   WISDOM TOOTH EXTRACTION       OB History   No obstetric history on file.     Family History  Problem  Relation Age of Onset   Parkinson's disease Mother    Arthritis Father     Social History   Tobacco Use   Smoking status: Never   Smokeless tobacco: Never  Vaping Use   Vaping Use: Never used  Substance Use Topics   Alcohol use: Yes   Drug use: Yes    Types: Heroin    Home Medications Prior to Admission medications   Medication Sig Start Date End Date Taking? Authorizing Provider  ibuprofen (ADVIL) 600 MG tablet Take 1 tablet (600 mg total) by mouth 3 (three) times daily as needed. 01/05/21   Golden Circle, FNP  sulfamethoxazole-trimethoprim (BACTRIM DS) 800-160 MG tablet Take 1 tablet by mouth 2 (two) times daily. 01/05/21   Golden Circle, FNP    Allergies    Gabapentin, Cefaclor, Cephalosporins, Nitrofurantoin, and Penicillins  Review of Systems   Review of Systems  Constitutional:  Negative for chills and fever.  Musculoskeletal:  Positive for arthralgias and joint swelling. Negative for gait problem.  Skin:  Positive for color change and wound.  All other systems reviewed and are negative.  Physical Exam Updated Vital Signs BP 122/69 (BP Location: Right Arm)   Pulse 83   Temp  98 F (36.7 C) (Oral)   Resp 18   LMP 01/04/2021 (Approximate)   SpO2 100%   Physical Exam Vitals and nursing note reviewed.  Constitutional:      General: She is not in acute distress.    Appearance: Normal appearance.  Eyes:     General: No scleral icterus.    Extraocular Movements: Extraocular movements intact.  Cardiovascular:     Rate and Rhythm: Normal rate.  Pulmonary:     Effort: Pulmonary effort is normal. No respiratory distress.  Musculoskeletal:     Cervical back: Neck supple.     Right lower leg: Normal.     Left lower leg: No swelling, deformity, lacerations, tenderness or bony tenderness. No edema.     Right ankle: Normal.     Left ankle: No swelling, deformity or ecchymosis. Tenderness present. Normal range of motion. Normal pulse.     Left Achilles  Tendon: Normal.     Comments: Mild tenderness to palpation to left medial malleolus. Erythema and swelling noted to left ankle. Full active range of motion of left ankle.  No tenderness to palpation of left shin.  Patient able to ambulate without difficulty or assistance. No antalgic gait.  DP and PT pulses intact bilaterally.  Strength intact bilaterally.   Skin:    General: Skin is warm and dry.     Findings: No bruising, erythema or rash.  Neurological:     Mental Status: She is alert.  Psychiatric:        Behavior: Behavior normal.    ED Results / Procedures / Treatments   Labs (all labs ordered are listed, but only abnormal results are displayed) Labs Reviewed - No data to display  EKG None  Radiology DG Tibia/Fibula Left  Result Date: 01/12/2021 CLINICAL DATA:  Possible foreign body lower leg EXAM: LEFT TIBIA AND FIBULA - 2 VIEW COMPARISON:  None. FINDINGS: There is a 3 mm linear foreign body in the soft tissues anterior to the mid tibial diaphysis seen in the lateral projection. This is not seen on the frontal projection and is likely directly anterior to the shin. The reported area of abnormality is just superior to the left medial malleolus, so this finding may be artifactual. No radiopaque foreign body is seen in the reported area of concern. There is mild edema in the soft tissues superior to the medial malleolus. There is no acute osseous abnormality. Knee and ankle alignment appear maintained. IMPRESSION: 3 mm linear foreign body in the soft tissues anterior to the mid tibial diaphysis seen in the lateral projection. This is not seen on the frontal projection and is likely directly anterior to the shin. The reported area of abnormality is just superior to the left medial malleolus, so this finding may be artifactual or reflect vascular calcification. No radiopaque foreign body is seen in the reported area of concern, though there is mild soft tissue edema superior to the medial  malleolus. Electronically Signed   By: Valetta Mole M.D.   On: 01/12/2021 17:14    Procedures Procedures   Medications Ordered in ED Medications  Tdap (BOOSTRIX) injection 0.5 mL (has no administration in time range)    ED Course  I have reviewed the triage vital signs and the nursing notes.  Pertinent labs & imaging results that were available during my care of the patient were reviewed by me and considered in my medical decision making (see chart for details).  Clinical Course as of 01/12/21 2022  Wed  Jan 12, 2021  1936 Went to reassess patient and discussed discharge treatment plan.  Patient not in room.  RN notified. [SB]  2018 Patient was to be discharged after receiving her tetanus vaccination, however, she eloped prior to receiving the tetanus vaccine. [SB]    Clinical Course User Index [SB] Qunicy Higinbotham A, PA-C   MDM Rules/Calculators/A&P                         Patient presents to the ED with left lower leg pain onset 6 days ago.  Patient reports IV drug use.  On exam patient with erythema noted to medial malleolus.  Full active range of motion of left ankle.  No tenderness to palpation to left ankle, shin, foot.  DP and PT pulses intact bilaterally.  Left tib-fib x-ray notable for 3 mm linear foreign body in the soft tissue anterior to the mid tibial diaphysis and mild soft tissue edema superior to the medial malleolus.  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  Vital signs stable, patient afebrile.  No immunization record of tetanus within the system.  Will update tetanus in the ED.  Discussed with patient to continue Bactrim use as prescribed.  Discussed with patient she can use warm compresses to the area as needed.  Referral given for on-call orthopedist, discussed with patient to call and set up follow-up appointment.  Follow-up with her orthopedist for further evaluation of her symptoms. Supportive care measures  are strict return precautions discussed with patient.  Patient knowledges and verbalized understanding.  Patient for safe discharge of this time.  Follow-up as indicated in discharge paperwork. Patient was to be discharged, however, patient eloped prior to receiving her tetanus vaccination.    Final Clinical Impression(s) / ED Diagnoses Final diagnoses:  Foreign body in skin    Rx / DC Orders ED Discharge Orders          Ordered    Consult to Peer Support       Provider:  (Not yet assigned)   01/12/21 1626             Eilee Schader A, PA-C 01/12/21 2022    Valarie Merino, MD 01/12/21 2322

## 2021-01-12 NOTE — ED Notes (Signed)
Pt eloped. Pt did not tell staff she was leaving.

## 2021-01-12 NOTE — Discharge Instructions (Addendum)
Your x-ray showed possible foreign body to your anterior left shin.  Your tetanus was updated in the ED.  Complete your course of Bactrim as prescribed.  You may use warm compresses to the area for up to 15 minutes at a time.  Attached is information for the on-call orthopedist, call and schedule follow-up appointment regarding today's visit.  You may follow-up with your primary care provider as needed.  Return to the emergency department if you are experiencing increasing/worsening leg pain, increasing redness, drainage.

## 2021-02-21 IMAGING — US US ABDOMEN LIMITED
1 series · 14 of 25 positions shown · non-contrast
Comparison: None.

CLINICAL DATA: Elevated liver function tests

EXAM:
ULTRASOUND ABDOMEN LIMITED RIGHT UPPER QUADRANT

[Series 1: us abdomen limited ruq · 14 of 32 slices shown]
[im 1/32]
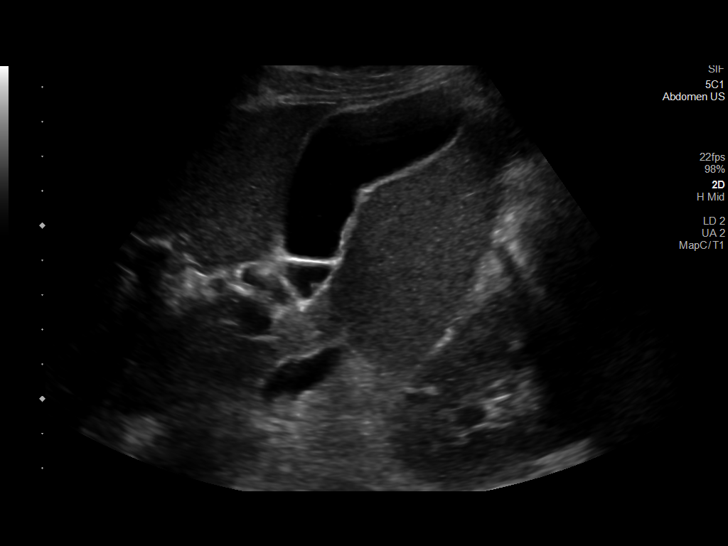
[im 3/32]
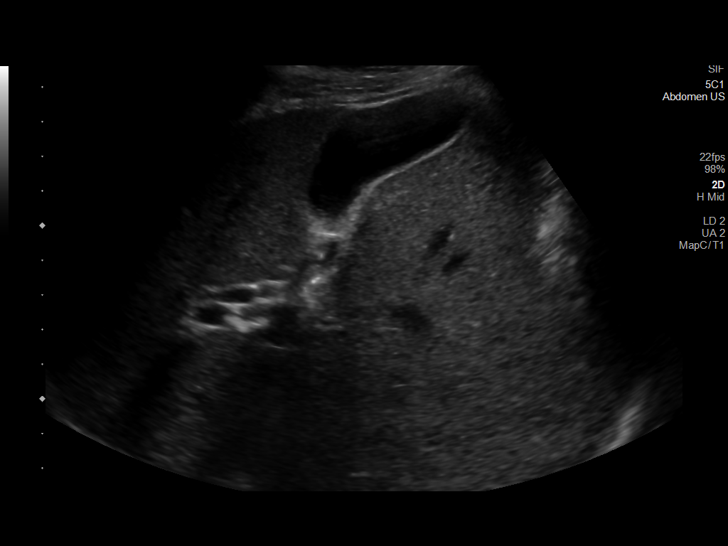
[im 6/32]
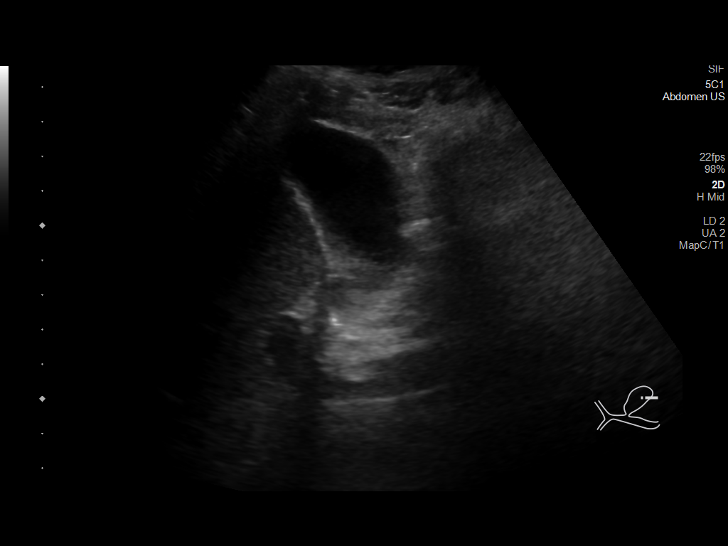
[im 8/32]
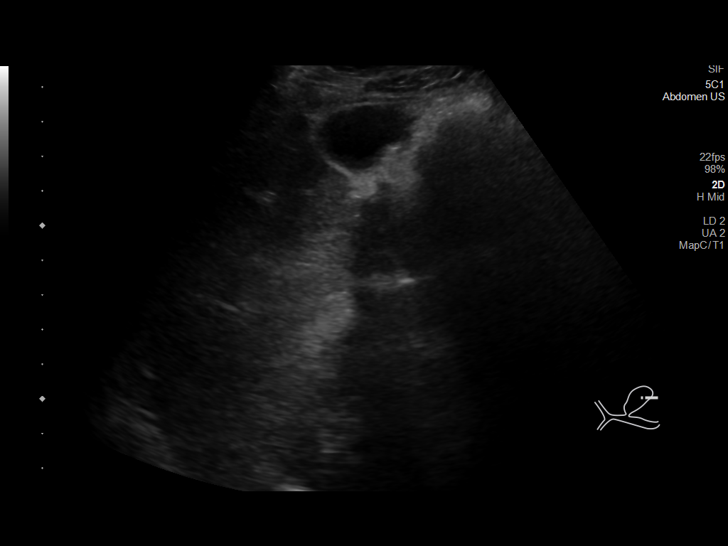
[im 11/32]
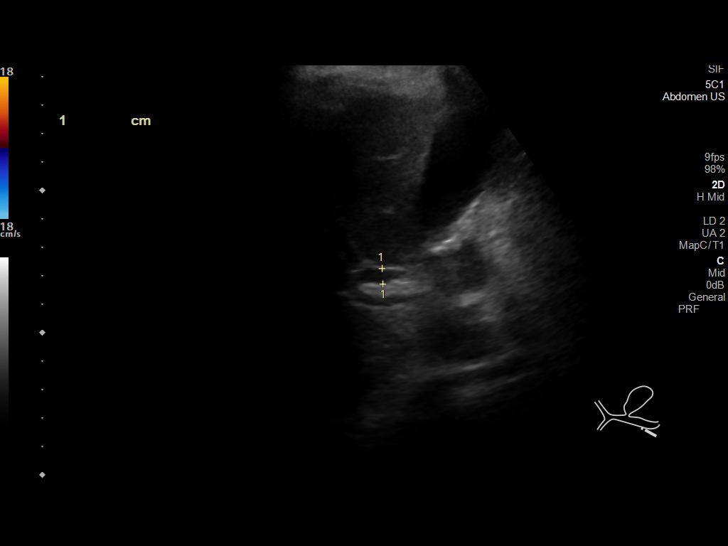
[im 12/32]
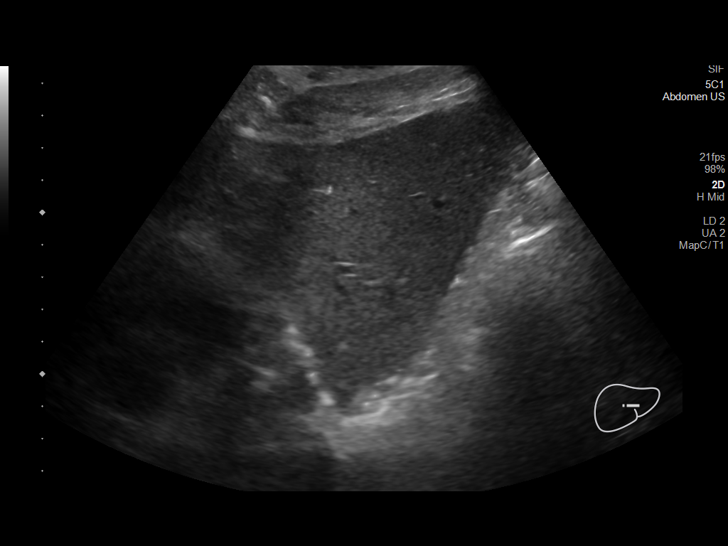
[im 15/32]
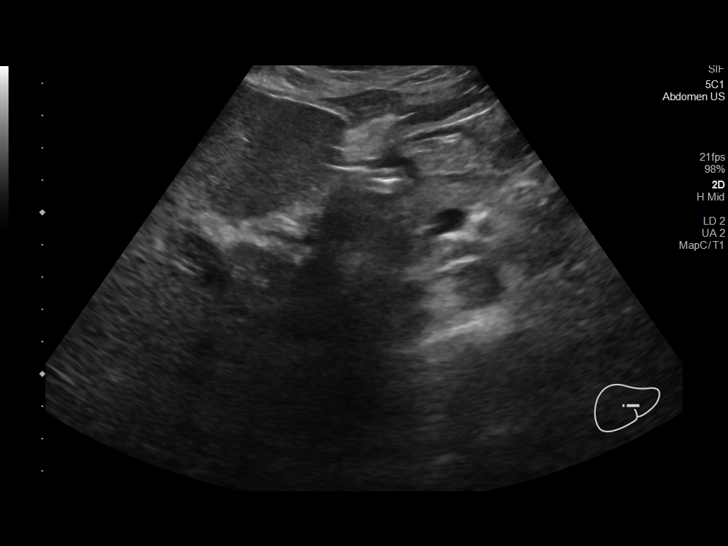
[im 17/32]
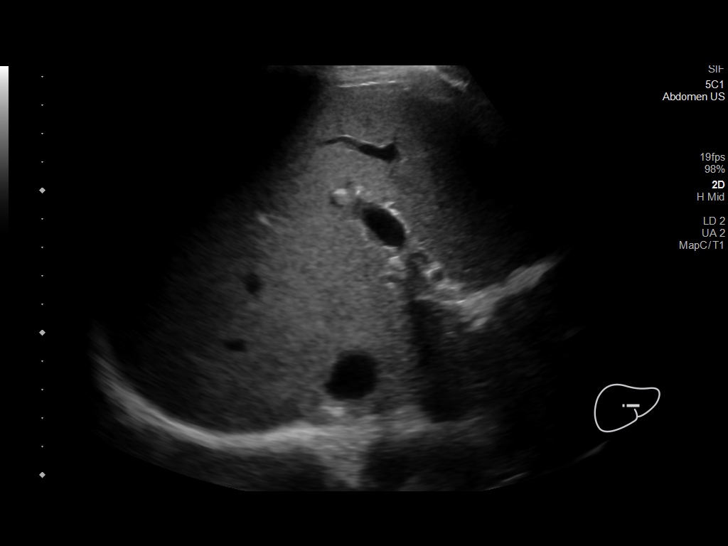
[im 20/32]
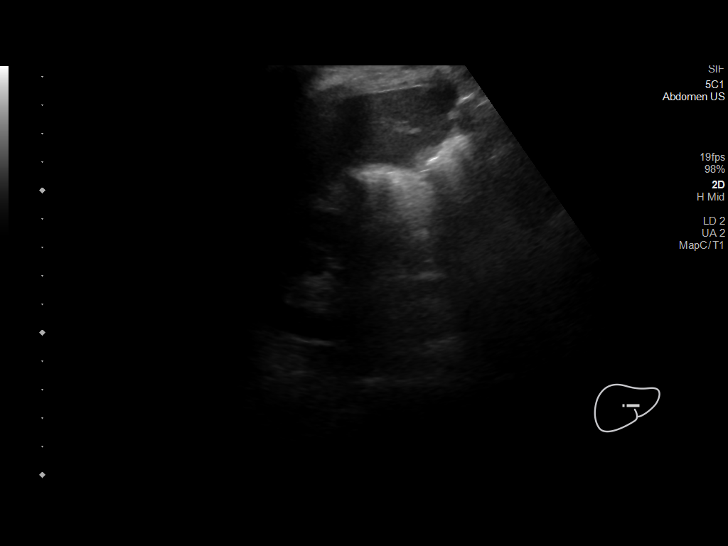
[im 21/32]
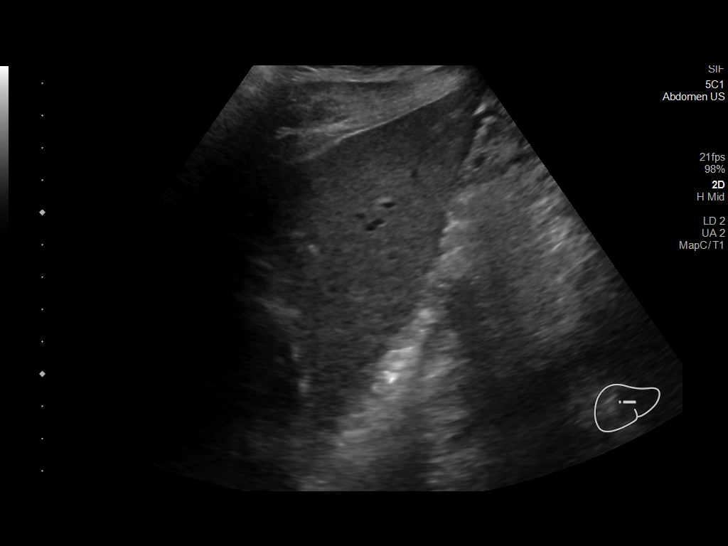
[im 24/32]
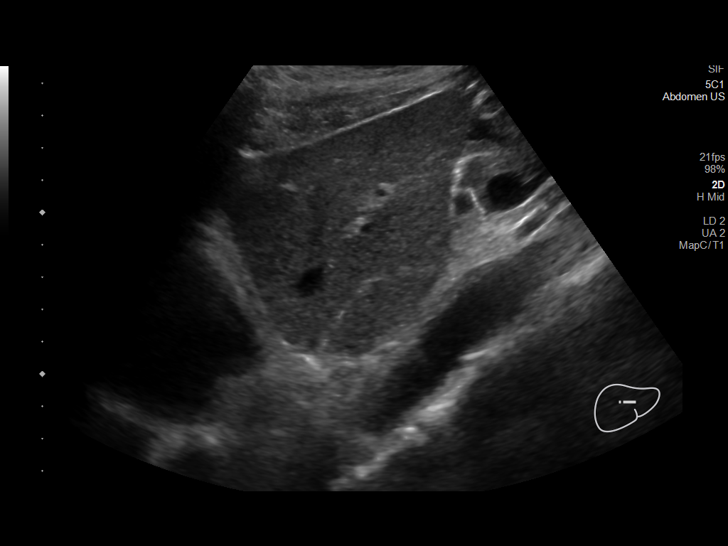
[im 26/32]
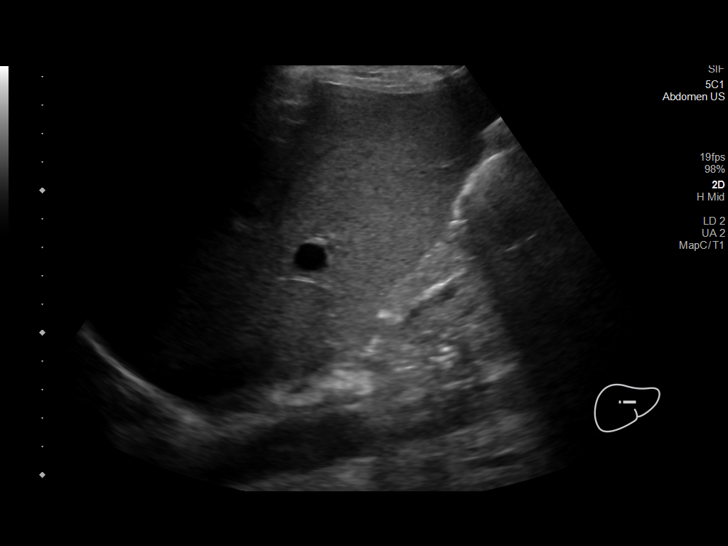
[im 29/32]
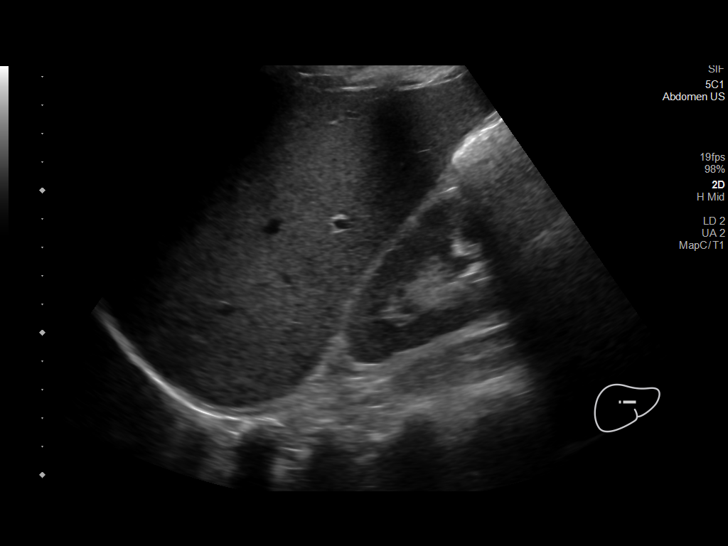
[im 32/32]
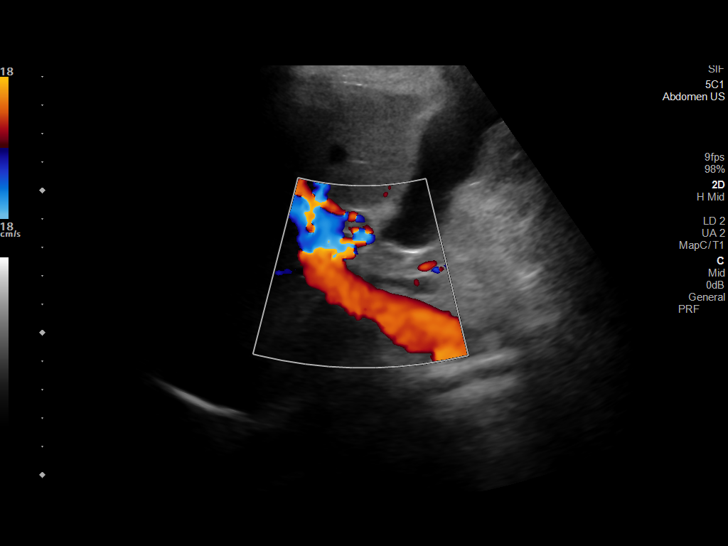

[14 of 25 positions shown; findings below may reference images not displayed]

FINDINGS: Gallbladder:

No gallstones or wall thickening visualized. No sonographic Murphy
sign noted by sonographer.

Common bile duct:

Diameter: 6 mm

Liver:

No focal lesion identified. Within normal limits in parenchymal
echogenicity. Portal vein is patent on color Doppler imaging with
normal direction of blood flow towards the liver.

Other: None.
IMPRESSION: 1. Unremarkable right upper quadrant ultrasound.

## 2021-02-27 ENCOUNTER — Other Ambulatory Visit: Payer: Self-pay | Admitting: Family

## 2021-03-10 ENCOUNTER — Other Ambulatory Visit: Payer: Self-pay

## 2021-03-10 ENCOUNTER — Encounter: Payer: Self-pay | Admitting: Family

## 2021-03-10 ENCOUNTER — Ambulatory Visit: Payer: Medicaid Other | Admitting: Family

## 2021-03-10 VITALS — BP 132/93 | HR 108 | Temp 98.4°F | Wt 165.0 lb

## 2021-03-10 DIAGNOSIS — Z01419 Encounter for gynecological examination (general) (routine) without abnormal findings: Secondary | ICD-10-CM

## 2021-03-10 DIAGNOSIS — Z9189 Other specified personal risk factors, not elsewhere classified: Secondary | ICD-10-CM | POA: Diagnosis not present

## 2021-03-10 DIAGNOSIS — Z5181 Encounter for therapeutic drug level monitoring: Secondary | ICD-10-CM

## 2021-03-10 DIAGNOSIS — T847XXD Infection and inflammatory reaction due to other internal orthopedic prosthetic devices, implants and grafts, subsequent encounter: Secondary | ICD-10-CM | POA: Diagnosis present

## 2021-03-10 DIAGNOSIS — F1990 Other psychoactive substance use, unspecified, uncomplicated: Secondary | ICD-10-CM

## 2021-03-10 MED ORDER — SULFAMETHOXAZOLE-TRIMETHOPRIM 800-160 MG PO TABS: 1.0000 | ORAL_TABLET | Freq: Two times a day (BID) | ORAL | 6 refills | Status: DC

## 2021-03-10 MED ORDER — IBUPROFEN 600 MG PO TABS: 600.0000 mg | ORAL_TABLET | Freq: Three times a day (TID) | ORAL | 3 refills | Status: DC | PRN

## 2021-03-10 NOTE — Assessment & Plan Note (Signed)
Monica Eaton appears stable with good tolerance to her Bactrim. No concerning signs/symptoms for potential of infection. With continued IV drug use will continue with current dose of Bactrim. Check inflammatory markers and BMET for therapeutic drug monitoring. Plan for follow up in 4 months or sooner if needed.

## 2021-03-10 NOTE — Assessment & Plan Note (Signed)
Monica Eaton continues to use IV drugs most recently earlier today. Discussed available resources. She is in the pre-contemplation stage of change at this point. Internal Medicine information provided.

## 2021-03-10 NOTE — Patient Instructions (Addendum)
Nice to see you.  We will check your lab work today.  Continue to take your Bactrim daily as prescribed.  Plan for follow up in 4 months or sooner if needed with lab work on the same day.  Contact for Internal Medicine: [place] [phone]  Have a great day!

## 2021-03-10 NOTE — Progress Notes (Signed)
Subjective:    Patient ID: Monica Eaton, female    DOB: 1975-06-19, 46 y.o.   MRN: [id number]  Chief Complaint  Patient presents with   Follow-up    HPI:  Monica Eaton is a 46 y.o. female on suppression therapy with Bactrim for MSSA hardware infection and recent cure of Hepatitis C presenting today for follow up.  Monica Eaton has been taking her Bactrim as prescribed with no adverse side effects. Has no problems obtaining medication. Has continued to use IV drugs with most recent earlier today. Overall feeling okay today with no new concerns/complaints.    Allergies  Allergen Reactions   Gabapentin Other (See Comments)    Causes extreme agitation.    Cefaclor    Cephalosporins    Nitrofurantoin    Penicillins     Spoke with patient's family. They report that Monica Eaton had a rash/hives when she was 19-50 years old. She did not have any shortness of breath and they do not recall having to take her to the doctor's office or hospital to treat the reaction.       Outpatient Medications Prior to Visit  Medication Sig Dispense Refill   ibuprofen (ADVIL) 600 MG tablet Take 1 tablet (600 mg total) by mouth 3 (three) times daily as needed. 30 tablet 0   sulfamethoxazole-trimethoprim (BACTRIM DS) 800-160 MG tablet Take 1 tablet by mouth 2 (two) times daily. 60 tablet 4   No facility-administered medications prior to visit.     Past Medical History:  Diagnosis Date   Alcohol abuse    IV drug user    heroin   Neck pain, chronic    Neuropathy      Past Surgical History:  Procedure Laterality Date   BREAST ENHANCEMENT SURGERY     CESAREAN SECTION     FINGER FRACTURE SURGERY Left    left ring finger fracture.   RADIOLOGY WITH ANESTHESIA N/A 08/08/2019   Procedure: MRI WITH ANESTHESIA;  Surgeon: Radiologist, Medication, MD;  Location: [facility];  Service: Radiology;  Laterality: N/A;   WISDOM TOOTH EXTRACTION         Review of Systems  Constitutional:  Negative for  chills, diaphoresis, fatigue, fever and unexpected weight change.  Respiratory:  Negative for cough, chest tightness, shortness of breath and wheezing.   Cardiovascular:  Negative for chest pain and leg swelling.  Gastrointestinal:  Negative for abdominal distention, abdominal pain, constipation, diarrhea, nausea and vomiting.  Neurological:  Negative for dizziness, weakness, light-headedness and headaches.  Hematological:  Does not bruise/bleed easily.     Objective:    BP (!) 132/93    Pulse (!) 108    Temp 98.4 F (36.9 C) (Temporal)    Wt 165 lb (74.8 kg)    BMI 25.84 kg/m  Nursing note and vital signs reviewed.  Physical Exam Constitutional:      General: She is not in acute distress.    Appearance: She is well-developed.  Cardiovascular:     Rate and Rhythm: Normal rate and regular rhythm.     Heart sounds: Normal heart sounds.  Pulmonary:     Effort: Pulmonary effort is normal.     Breath sounds: Normal breath sounds.  Skin:    General: Skin is warm and dry.  Neurological:     Mental Status: She is alert and oriented to person, place, and time.  Psychiatric:        Behavior: Behavior normal.        Thought  Content: Thought content normal.        Judgment: Judgment normal.     Depression screen [facility] 2/9 03/10/2021 01/05/2021 09/30/2019  Decreased Interest 0 2 1  Down, Depressed, Hopeless 0 3 1  PHQ - 2 Score 0 5 2  Altered sleeping - 2 2  Tired, decreased energy - 2 2  Change in appetite - 1 1  Feeling bad or failure about yourself  - 0 3  Trouble concentrating - 0 1  Moving slowly or fidgety/restless - 0 0  Suicidal thoughts - 2 0  PHQ-9 Score - 12 11       Assessment & Plan:    Patient Active Problem List   Diagnosis Date Noted   Lower extremity edema 11/17/2020   LFT elevation    Chronic hepatitis C with hepatic coma (HCC)    Hepatitis    Malnutrition of moderate degree 08/19/2019   Epidural abscess    Discitis of cervical region    Hardware  complicating wound infection ([place])    Bandemia    MSSA bacteremia    Cervical pain (neck) 08/08/2019   IVDU (intravenous drug user) 08/08/2019   Tobacco abuse 08/08/2019   Neck pain 08/08/2019     Problem List Items Addressed This Visit       Other   IVDU (intravenous drug user)    Ms. Verne continues to use IV drugs most recently earlier today. Discussed available resources. She is in the pre-contemplation stage of change at this point. Internal Medicine information provided.       Hardware complicating wound infection ([place]) - Primary    Monica Eaton appears stable with good tolerance to her Bactrim. No concerning signs/symptoms for potential of infection. With continued IV drug use will continue with current dose of Bactrim. Check inflammatory markers and BMET for therapeutic drug monitoring. Plan for follow up in 4 months or sooner if needed.       Relevant Medications   sulfamethoxazole-trimethoprim (BACTRIM DS) 800-160 MG tablet   Other Relevant Orders   Basic metabolic panel   Sedimentation rate   C-reactive protein   Other Visit Diagnoses     Therapeutic drug monitoring       Relevant Orders   Basic metabolic panel   Sedimentation rate   C-reactive protein   At risk for breast cancer       Relevant Orders   MM DIGITAL SCREENING BILATERAL   Well woman exam       Relevant Orders   Ambulatory referral to Gynecology        I am having Monica Coop "Chrissie" maintain her sulfamethoxazole-trimethoprim and ibuprofen.   Meds ordered this encounter  Medications   sulfamethoxazole-trimethoprim (BACTRIM DS) 800-160 MG tablet    Sig: Take 1 tablet by mouth 2 (two) times daily.    Dispense:  60 tablet    Refill:  6    Order Specific Question:   Supervising Provider    Answer:   Baxter Flattery, CYNTHIA [[id number]]   ibuprofen (ADVIL) 600 MG tablet    Sig: Take 1 tablet (600 mg total) by mouth 3 (three) times daily as needed.    Dispense:  30 tablet    Refill:  3     Order Specific Question:   Supervising Provider    Answer:   Carlyle Basques [[id number]]     Follow-up: Return in about 4 months (around 07/08/2021), or if symptoms worsen or fail to improve.   Terri Piedra, MSN, FNP-C Nurse  Practitioner [facility] for Infectious Disease [place] number: [phone]

## 2021-03-11 LAB — BASIC METABOLIC PANEL
BUN: 10 mg/dL (ref 7–25)
CO2: 25 mmol/L (ref 20–32)
Calcium: 9.2 mg/dL (ref 8.6–10.2)
Chloride: 101 mmol/L (ref 98–110)
Creat: 0.66 mg/dL (ref 0.50–0.99)
Glucose, Bld: 100 mg/dL — ABNORMAL HIGH (ref 65–99)
Potassium: 4.3 mmol/L (ref 3.5–5.3)
Sodium: 136 mmol/L (ref 135–146)

## 2021-03-11 LAB — C-REACTIVE PROTEIN: CRP: 3.1 mg/L (ref <8.0)

## 2021-03-11 LAB — SEDIMENTATION RATE: Sed Rate: 9 mm/h (ref 0–20)

## 2021-04-06 ENCOUNTER — Telehealth: Payer: Self-pay

## 2021-04-06 NOTE — Telephone Encounter (Signed)
Lmovm for pt to call and schedule an appt from her referral  ?

## 2021-05-03 ENCOUNTER — Other Ambulatory Visit: Payer: Self-pay | Admitting: Family

## 2021-05-03 DIAGNOSIS — Z9189 Other specified personal risk factors, not elsewhere classified: Secondary | ICD-10-CM

## 2021-05-03 DIAGNOSIS — Z1231 Encounter for screening mammogram for malignant neoplasm of breast: Secondary | ICD-10-CM

## 2021-05-12 ENCOUNTER — Ambulatory Visit
Admission: RE | Admit: 2021-05-12 | Discharge: 2021-05-12 | Disposition: A | Discharge status: Home / Self Care | Payer: Medicaid Other | Source: Ambulatory Visit | Attending: Family | Admitting: Family

## 2021-05-12 DIAGNOSIS — Z9189 Other specified personal risk factors, not elsewhere classified: Secondary | ICD-10-CM

## 2021-05-12 DIAGNOSIS — Z1231 Encounter for screening mammogram for malignant neoplasm of breast: Secondary | ICD-10-CM

## 2021-05-25 ENCOUNTER — Telehealth: Payer: Self-pay

## 2021-05-25 NOTE — Telephone Encounter (Signed)
Left message for pt to call office back regarding referral that was sent to [facility].  

## 2021-06-03 ENCOUNTER — Telehealth: Payer: Self-pay

## 2021-06-03 NOTE — Telephone Encounter (Signed)
Left message for pt to call office back regarding referral that was placed to [facility].  ?

## 2021-07-20 ENCOUNTER — Encounter: Payer: Self-pay | Admitting: Family

## 2021-07-20 ENCOUNTER — Ambulatory Visit: Payer: Medicaid Other | Admitting: Family

## 2021-07-20 ENCOUNTER — Other Ambulatory Visit: Payer: Self-pay

## 2021-07-20 VITALS — BP 113/74 | HR 99 | Resp 16 | Ht 67.0 in | Wt 173.0 lb

## 2021-07-20 DIAGNOSIS — T847XXD Infection and inflammatory reaction due to other internal orthopedic prosthetic devices, implants and grafts, subsequent encounter: Secondary | ICD-10-CM | POA: Diagnosis present

## 2021-07-20 DIAGNOSIS — F1990 Other psychoactive substance use, unspecified, uncomplicated: Secondary | ICD-10-CM

## 2021-07-20 DIAGNOSIS — Z5181 Encounter for therapeutic drug level monitoring: Secondary | ICD-10-CM | POA: Diagnosis not present

## 2021-07-20 DIAGNOSIS — F1910 Other psychoactive substance abuse, uncomplicated: Secondary | ICD-10-CM | POA: Diagnosis not present

## 2021-07-20 MED ORDER — IBUPROFEN 600 MG PO TABS: 600.0000 mg | ORAL_TABLET | Freq: Three times a day (TID) | ORAL | 3 refills | Status: AC | PRN

## 2021-07-20 MED ORDER — SULFAMETHOXAZOLE-TRIMETHOPRIM 800-160 MG PO TABS: 1.0000 | ORAL_TABLET | Freq: Two times a day (BID) | ORAL | 6 refills | Status: AC

## 2021-07-20 NOTE — Patient Instructions (Signed)
Nice to see you.  We will check your lab work today.  Continue to take your medication daily as prescribed.  Refills have been sent to the pharmacy.  Plan for follow up in 6 months or sooner if needed.   Have a great day and stay safe!

## 2021-07-20 NOTE — Progress Notes (Signed)
Subjective:    Patient ID: Monica Eaton, female    DOB: 1976-02-03, 46 y.o.   MRN: [id number]  Chief Complaint  Patient presents with   Follow-up    HPI:  Monica Eaton is a 46 y.o. female on suppressive therapy with Bactrim for MSSA hardware infection and recent cure of hepatitis C last seen on 03/10/2021 with good tolerance to Bactrim.  Creatinine of 0.66 and inflammatory markers with CRP of 3.1 and ESR of 9.  Here today for routine follow-up.  Monica Eaton continues to take her Bactrim as prescribed with no adverse side effects. Neck is doing well with waxing and waning pains on occasion. No radiculopathy. Concerned about weight gain. 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    Allergies  Allergen Reactions   Gabapentin Other (See Comments)    Causes extreme agitation.    Cefaclor    Cephalosporins    Nitrofurantoin    Penicillins     Spoke with patient's family. They report that Monica Eaton had a rash/hives when she was 32-74 years old. She did not have any shortness of breath and they do not recall having to take her to the doctor's office or hospital to treat the reaction.       Outpatient Medications Prior to Visit  Medication Sig Dispense Refill   ibuprofen (ADVIL) 600 MG tablet Take 1 tablet (600 mg total) by mouth 3 (three) times daily as needed. 30 tablet 3   sulfamethoxazole-trimethoprim (BACTRIM DS) 800-160 MG tablet Take 1 tablet by mouth 2 (two) times daily. 60 tablet 6   No facility-administered medications prior to visit.     Past Medical History:  Diagnosis Date   Alcohol abuse    IV drug user    heroin   Neck pain, chronic    Neuropathy      Past Surgical History:  Procedure Laterality Date   AUGMENTATION MAMMAPLASTY Bilateral 2005   AUGMENTATION MAMMAPLASTY Bilateral 2012   BREAST ENHANCEMENT SURGERY     CESAREAN SECTION     FINGER FRACTURE SURGERY Left    left ring finger fracture.   RADIOLOGY WITH ANESTHESIA  N/A 08/08/2019   Procedure: MRI WITH ANESTHESIA;  Surgeon: Radiologist, Medication, MD;  Location: [facility];  Service: Radiology;  Laterality: N/A;   WISDOM TOOTH EXTRACTION         Review of Systems  Constitutional:  Negative for chills, diaphoresis, fatigue and fever.  Respiratory:  Negative for cough, chest tightness, shortness of breath and wheezing.   Cardiovascular:  Negative for chest pain.  Gastrointestinal:  Negative for abdominal pain, diarrhea, nausea and vomiting.      Objective:    BP 113/74   Pulse 99   Resp 16   Ht _0  (1.702 m)   Wt 173 lb (78.5 kg)   SpO2 100%   BMI 27.10 kg/m  Nursing note and vital signs reviewed.  Physical Exam Constitutional:      General: She is not in acute distress.    Appearance: She is well-developed.  Cardiovascular:     Rate and Rhythm: Normal rate and regular rhythm.     Heart sounds: Normal heart sounds.  Pulmonary:     Effort: Pulmonary effort is normal.     Breath sounds: Normal breath sounds.  Skin:    General: Skin is warm and dry.  Neurological:     Mental Status: She is alert and oriented to person, place, and time.  Psychiatric:  Behavior: Behavior normal.        Thought Content: Thought content normal.        Judgment: Judgment normal.         07/20/2021    3:20 PM 03/10/2021    2:21 PM 01/05/2021    3:23 PM 09/30/2019   10:16 AM  Depression screen PHQ 2/9  Decreased Interest 0 0 2 1  Down, Depressed, Hopeless 0 0 3 1  PHQ - 2 Score 0 0 5 2  Altered sleeping   2 2  Tired, decreased energy   2 2  Change in appetite   1 1  Feeling bad or failure about yourself    0 3  Trouble concentrating   0 1  Moving slowly or fidgety/restless   0 0  Suicidal thoughts   2 0  PHQ-9 Score   12 11       Assessment & Plan:    Patient Active Problem List   Diagnosis Date Noted   Therapeutic drug monitoring 07/20/2021   IV drug abuse ([place]) 07/20/2021   Lower extremity edema 11/17/2020   LFT elevation     Chronic hepatitis C with hepatic coma (HCC)    Hepatitis    Malnutrition of moderate degree 08/19/2019   Epidural abscess    Discitis of cervical region    Hardware complicating wound infection ([place])    Bandemia    MSSA bacteremia    Cervical pain (neck) 08/08/2019   IVDU (intravenous drug user) 08/08/2019   Tobacco abuse 08/08/2019   Neck pain 08/08/2019     Problem List Items Addressed This Visit       Other   IVDU (intravenous drug user)   Hardware complicating wound infection ([place]) - Primary    Previous history of MSSA neck infection and abscess appears adequately suppressed with no signs of infection. Discussed that continued drug use increases risk of new infection. Continue current dose of Bactrim of suppression.       Relevant Medications   sulfamethoxazole-trimethoprim (BACTRIM DS) 800-160 MG tablet   Other Relevant Orders   Comprehensive metabolic panel   Sedimentation rate   C-reactive protein   Therapeutic drug monitoring    Renal function and electrolytes stable with continued Bactrim. Check CMET today. Continue therapeutic monitoring while on Bactrim.       Relevant Orders   Comprehensive metabolic panel   Sedimentation rate   C-reactive protein   IV drug abuse (HCC)    Continues to use fentanyl through injection. Discussed increased risk for further infection amongst overdose and possibility of death. Does have narcan. Encouraged cessation.          I am having Monica Eaton "Monica Eaton" maintain her sulfamethoxazole-trimethoprim and ibuprofen.   Meds ordered this encounter  Medications   sulfamethoxazole-trimethoprim (BACTRIM DS) 800-160 MG tablet    Sig: Take 1 tablet by mouth 2 (two) times daily.    Dispense:  60 tablet    Refill:  6    Order Specific Question:   Supervising Provider    Answer:   Baxter Flattery, CYNTHIA [[id number]]   ibuprofen (ADVIL) 600 MG tablet    Sig: Take 1 tablet (600 mg total) by mouth 3 (three) times daily as needed.     Dispense:  30 tablet    Refill:  3    Order Specific Question:   Supervising Provider    Answer:   Carlyle Basques [[id number]]     Follow-up: Return in about 6 months (around 01/19/2022),  or if symptoms worsen or fail to improve.   Terri Piedra, MSN, FNP-C Nurse Practitioner [facility] for Infectious Disease [place] number: [phone]

## 2021-07-20 NOTE — Assessment & Plan Note (Signed)
Renal function and electrolytes stable with continued Bactrim. Check CMET today. Continue therapeutic monitoring while on Bactrim.

## 2021-07-20 NOTE — Assessment & Plan Note (Signed)
Continues to use fentanyl through injection. Discussed increased risk for further infection amongst overdose and possibility of death. Does have narcan. Encouraged cessation.

## 2021-07-20 NOTE — Assessment & Plan Note (Signed)
Previous history of MSSA neck infection and abscess appears adequately suppressed with no signs of infection. Discussed that continued drug use increases risk of new infection. Continue current dose of Bactrim of suppression.

## 2021-07-20 NOTE — Assessment & Plan Note (Deleted)
Continues to use fentanyl through injection. Discussed increased risk for further infection amongst overdose and possibility of death. Does have narcan.

## 2021-07-21 LAB — COMPREHENSIVE METABOLIC PANEL
AG Ratio: 1.7 (calc) (ref 1.0–2.5)
ALT: 18 U/L (ref 6–29)
AST: 22 U/L (ref 10–30)
Albumin: 3.9 g/dL (ref 3.6–5.1)
Alkaline phosphatase (APISO): 100 U/L (ref 31–125)
BUN: 12 mg/dL (ref 7–25)
CO2: 28 mmol/L (ref 20–32)
Calcium: 8.8 mg/dL (ref 8.6–10.2)
Chloride: 105 mmol/L (ref 98–110)
Creat: 0.76 mg/dL (ref 0.50–0.99)
Globulin: 2.3 g/dL (calc) (ref 1.9–3.7)
Glucose, Bld: 80 mg/dL (ref 65–99)
Potassium: 4.7 mmol/L (ref 3.5–5.3)
Sodium: 138 mmol/L (ref 135–146)
Total Bilirubin: 0.2 mg/dL (ref 0.2–1.2)
Total Protein: 6.2 g/dL (ref 6.1–8.1)

## 2021-07-21 LAB — C-REACTIVE PROTEIN: CRP: 1.2 mg/L (ref <8.0)

## 2021-07-21 LAB — SEDIMENTATION RATE: Sed Rate: 6 mm/h (ref 0–20)

## 2021-08-02 ENCOUNTER — Ambulatory Visit (INDEPENDENT_AMBULATORY_CARE_PROVIDER_SITE_OTHER): Payer: Medicaid Other | Admitting: Obstetrics & Gynecology

## 2021-08-02 ENCOUNTER — Other Ambulatory Visit (HOSPITAL_COMMUNITY)
Admission: RE | Admit: 2021-08-02 | Discharge: 2021-08-02 | Disposition: A | Discharge status: Home / Self Care | Payer: Medicaid Other | Source: Ambulatory Visit | Attending: Obstetrics & Gynecology | Admitting: Obstetrics & Gynecology

## 2021-08-02 ENCOUNTER — Encounter: Payer: Self-pay | Admitting: Obstetrics & Gynecology

## 2021-08-02 VITALS — BP 108/71 | HR 96 | Wt 169.4 lb

## 2021-08-02 DIAGNOSIS — Z01419 Encounter for gynecological examination (general) (routine) without abnormal findings: Secondary | ICD-10-CM

## 2021-08-02 DIAGNOSIS — Z113 Encounter for screening for infections with a predominantly sexual mode of transmission: Secondary | ICD-10-CM | POA: Diagnosis present

## 2021-08-02 DIAGNOSIS — R87610 Atypical squamous cells of undetermined significance on cytologic smear of cervix (ASC-US): Secondary | ICD-10-CM | POA: Insufficient documentation

## 2021-08-03 LAB — RPR: RPR Ser Ql: NONREACTIVE

## 2021-08-03 LAB — HEPATITIS B SURFACE ANTIGEN: Hepatitis B Surface Ag: NEGATIVE

## 2021-08-03 LAB — HIV ANTIBODY (ROUTINE TESTING W REFLEX): HIV Screen 4th Generation wRfx: NONREACTIVE

## 2021-08-04 LAB — CYTOLOGY - PAP
Chlamydia: NEGATIVE
Comment: NEGATIVE
Comment: NEGATIVE
Comment: NEGATIVE
Comment: NEGATIVE
Comment: NEGATIVE
Comment: NORMAL
Diagnosis: UNDETERMINED — AB
HPV 16: NEGATIVE
HPV 18 / 45: NEGATIVE
High risk HPV: POSITIVE — AB
Neisseria Gonorrhea: NEGATIVE
Trichomonas: NEGATIVE

## 2021-08-05 ENCOUNTER — Encounter: Payer: Self-pay | Admitting: Obstetrics & Gynecology

## 2021-08-08 ENCOUNTER — Telehealth: Payer: Self-pay | Admitting: *Deleted

## 2021-08-08 ENCOUNTER — Telehealth: Payer: Self-pay

## 2021-08-08 NOTE — Telephone Encounter (Signed)
erroneous

## 2021-08-08 NOTE — Telephone Encounter (Signed)
-----   Message from Monica Oman, MD sent at 08/05/2021  3:38 PM EDT ----- Please schedule patient for colposcopy for ASCUS +HRHPV pap done on 08/02/21. Please call to inform patient of results and need for appointment.

## 2021-08-08 NOTE — Telephone Encounter (Signed)
Pt returning earlier phone call.  Results given to patient and patient agreed to proceed with scheduling colpo.  Patient has no further questions at this time.

## 2021-10-04 ENCOUNTER — Encounter: Payer: Self-pay | Admitting: Obstetrics & Gynecology

## 2021-10-04 ENCOUNTER — Ambulatory Visit (INDEPENDENT_AMBULATORY_CARE_PROVIDER_SITE_OTHER): Payer: Medicaid Other | Admitting: Obstetrics & Gynecology

## 2021-10-04 ENCOUNTER — Other Ambulatory Visit (HOSPITAL_COMMUNITY)
Admission: RE | Admit: 2021-10-04 | Discharge: 2021-10-04 | Disposition: A | Discharge status: Home / Self Care | Payer: Medicaid Other | Source: Ambulatory Visit | Attending: Obstetrics & Gynecology | Admitting: Obstetrics & Gynecology

## 2021-10-04 VITALS — BP 112/73 | HR 80 | Wt 165.2 lb

## 2021-10-04 DIAGNOSIS — Z01812 Encounter for preprocedural laboratory examination: Secondary | ICD-10-CM | POA: Diagnosis not present

## 2021-10-04 DIAGNOSIS — R87610 Atypical squamous cells of undetermined significance on cytologic smear of cervix (ASC-US): Secondary | ICD-10-CM | POA: Insufficient documentation

## 2021-10-04 DIAGNOSIS — R87810 Cervical high risk human papillomavirus (HPV) DNA test positive: Secondary | ICD-10-CM | POA: Diagnosis present

## 2021-10-04 LAB — POCT URINE PREGNANCY: Preg Test, Ur: NEGATIVE

## 2021-10-04 NOTE — Patient Instructions (Signed)
COLPOSCOPY POST-PROCEDURE INSTRUCTIONS  You may take Ibuprofen, Aleve or Tylenol for cramping if needed.  If Monsel's solution was used, you will have a black discharge.  Light bleeding is normal.  If bleeding is heavier than your period, please call.  Put nothing in your vagina until the bleeding or discharge stops (usually 2 or3 days).  We will call you within one week with biopsy results

## 2021-10-04 NOTE — Progress Notes (Signed)
    GYNECOLOGY OFFICE COLPOSCOPY PROCEDURE NOTE  46 y.o. F here for colposcopy for ASCUS with POSITIVE high risk HPV pap smear on 08/02/21. Discussed role for HPV in cervical dysplasia, need for surveillance.  Patient gave informed written consent, time out was performed.  Placed in lithotomy position. Cervix viewed with speculum and colposcope after application of acetic acid.   Colposcopy adequate? Yes No visible lesions. ECC specimen obtained. Specimen was labeled and sent to pathology.  Chaperone was present during entire procedure.  Patient was given post procedure instructions.  Will follow up pathology and manage accordingly; patient will be contacted with results and recommendations.  Routine preventative health maintenance measures emphasized.    Verita Schneiders, MD, [place] [facility] [employer], [place]

## 2021-10-05 LAB — SURGICAL PATHOLOGY

## 2021-11-09 ENCOUNTER — Telehealth: Payer: Self-pay

## 2021-11-09 NOTE — Telephone Encounter (Signed)
Return call to pt regarding triage message No answer left vm for pt to return call.

## 2022-01-19 ENCOUNTER — Encounter (HOSPITAL_COMMUNITY): Payer: Self-pay

## 2022-01-19 ENCOUNTER — Emergency Department (HOSPITAL_COMMUNITY): Payer: Medicaid Other

## 2022-01-19 ENCOUNTER — Other Ambulatory Visit: Payer: Self-pay

## 2022-01-19 ENCOUNTER — Inpatient Hospital Stay (HOSPITAL_COMMUNITY)
Admission: EM | Admit: 2022-01-19 | Discharge: 2022-01-25 | DRG: 871 | Disposition: A | Discharge status: Rehabilitation Facility | Payer: Medicaid Other | Attending: Internal Medicine | Admitting: Internal Medicine

## 2022-01-19 DIAGNOSIS — Z880 Allergy status to penicillin: Secondary | ICD-10-CM | POA: Diagnosis not present

## 2022-01-19 DIAGNOSIS — R933 Abnormal findings on diagnostic imaging of other parts of digestive tract: Secondary | ICD-10-CM

## 2022-01-19 DIAGNOSIS — Z8261 Family history of arthritis: Secondary | ICD-10-CM

## 2022-01-19 DIAGNOSIS — F17210 Nicotine dependence, cigarettes, uncomplicated: Secondary | ICD-10-CM | POA: Diagnosis present

## 2022-01-19 DIAGNOSIS — J690 Pneumonitis due to inhalation of food and vomit: Secondary | ICD-10-CM | POA: Diagnosis present

## 2022-01-19 DIAGNOSIS — Z820 Family history of epilepsy and other diseases of the nervous system: Secondary | ICD-10-CM | POA: Diagnosis not present

## 2022-01-19 DIAGNOSIS — K269 Duodenal ulcer, unspecified as acute or chronic, without hemorrhage or perforation: Secondary | ICD-10-CM | POA: Diagnosis present

## 2022-01-19 DIAGNOSIS — Z881 Allergy status to other antibiotic agents status: Secondary | ICD-10-CM | POA: Diagnosis not present

## 2022-01-19 DIAGNOSIS — D649 Anemia, unspecified: Secondary | ICD-10-CM | POA: Diagnosis present

## 2022-01-19 DIAGNOSIS — F1990 Other psychoactive substance use, unspecified, uncomplicated: Secondary | ICD-10-CM | POA: Diagnosis not present

## 2022-01-19 DIAGNOSIS — G062 Extradural and subdural abscess, unspecified: Secondary | ICD-10-CM | POA: Diagnosis present

## 2022-01-19 DIAGNOSIS — A419 Sepsis, unspecified organism: Secondary | ICD-10-CM | POA: Diagnosis present

## 2022-01-19 DIAGNOSIS — R1013 Epigastric pain: Secondary | ICD-10-CM | POA: Insufficient documentation

## 2022-01-19 DIAGNOSIS — N898 Other specified noninflammatory disorders of vagina: Secondary | ICD-10-CM | POA: Diagnosis present

## 2022-01-19 DIAGNOSIS — Z23 Encounter for immunization: Secondary | ICD-10-CM | POA: Diagnosis not present

## 2022-01-19 DIAGNOSIS — J189 Pneumonia, unspecified organism: Secondary | ICD-10-CM

## 2022-01-19 DIAGNOSIS — G9341 Metabolic encephalopathy: Secondary | ICD-10-CM

## 2022-01-19 DIAGNOSIS — E876 Hypokalemia: Secondary | ICD-10-CM | POA: Diagnosis present

## 2022-01-19 DIAGNOSIS — Z792 Long term (current) use of antibiotics: Secondary | ICD-10-CM

## 2022-01-19 DIAGNOSIS — Z1152 Encounter for screening for COVID-19: Secondary | ICD-10-CM

## 2022-01-19 DIAGNOSIS — Z6831 Body mass index (BMI) 31.0-31.9, adult: Secondary | ICD-10-CM

## 2022-01-19 DIAGNOSIS — F1010 Alcohol abuse, uncomplicated: Secondary | ICD-10-CM | POA: Diagnosis present

## 2022-01-19 DIAGNOSIS — R112 Nausea with vomiting, unspecified: Secondary | ICD-10-CM | POA: Diagnosis not present

## 2022-01-19 DIAGNOSIS — E669 Obesity, unspecified: Secondary | ICD-10-CM | POA: Diagnosis present

## 2022-01-19 DIAGNOSIS — M4622 Osteomyelitis of vertebra, cervical region: Secondary | ICD-10-CM | POA: Diagnosis present

## 2022-01-19 DIAGNOSIS — E860 Dehydration: Secondary | ICD-10-CM | POA: Diagnosis present

## 2022-01-19 DIAGNOSIS — R0902 Hypoxemia: Secondary | ICD-10-CM | POA: Diagnosis present

## 2022-01-19 DIAGNOSIS — Z8619 Personal history of other infectious and parasitic diseases: Secondary | ICD-10-CM | POA: Diagnosis not present

## 2022-01-19 DIAGNOSIS — F1110 Opioid abuse, uncomplicated: Secondary | ICD-10-CM | POA: Diagnosis present

## 2022-01-19 DIAGNOSIS — R6520 Severe sepsis without septic shock: Secondary | ICD-10-CM | POA: Diagnosis present

## 2022-01-19 DIAGNOSIS — B182 Chronic viral hepatitis C: Secondary | ICD-10-CM | POA: Diagnosis present

## 2022-01-19 DIAGNOSIS — K7682 Hepatic encephalopathy: Secondary | ICD-10-CM | POA: Diagnosis present

## 2022-01-19 DIAGNOSIS — M1990 Unspecified osteoarthritis, unspecified site: Secondary | ICD-10-CM | POA: Diagnosis not present

## 2022-01-19 DIAGNOSIS — Z888 Allergy status to other drugs, medicaments and biological substances status: Secondary | ICD-10-CM | POA: Diagnosis not present

## 2022-01-19 DIAGNOSIS — R1010 Upper abdominal pain, unspecified: Secondary | ICD-10-CM

## 2022-01-19 LAB — SALICYLATE LEVEL: Salicylate Lvl: <7 mg/dL — ABNORMAL LOW (ref 7.0–30.0)

## 2022-01-19 LAB — CBC WITH DIFFERENTIAL/PLATELET
Abs Immature Granulocytes: 0.06 10*3/uL (ref 0.00–0.07)
Basophils Absolute: 0 10*3/uL (ref 0.0–0.1)
Basophils Relative: 0 %
Eosinophils Absolute: 0 10*3/uL (ref 0.0–0.5)
Eosinophils Relative: 0 %
HCT: 35 % — ABNORMAL LOW (ref 36.0–46.0)
Hemoglobin: 11.2 g/dL — ABNORMAL LOW (ref 12.0–15.0)
Immature Granulocytes: 0 %
Lymphocytes Relative: 11 %
Lymphs Abs: 1.6 10*3/uL (ref 0.7–4.0)
MCH: 30.2 pg (ref 26.0–34.0)
MCHC: 32 g/dL (ref 30.0–36.0)
MCV: 94.3 fL (ref 80.0–100.0)
Monocytes Absolute: 1.2 10*3/uL — ABNORMAL HIGH (ref 0.1–1.0)
Monocytes Relative: 8 %
Neutro Abs: 12.5 10*3/uL — ABNORMAL HIGH (ref 1.7–7.7)
Neutrophils Relative %: 81 %
Platelets: 195 10*3/uL (ref 150–400)
RBC: 3.71 MIL/uL — ABNORMAL LOW (ref 3.87–5.11)
RDW: 12.5 % (ref 11.5–15.5)
WBC: 15.4 10*3/uL — ABNORMAL HIGH (ref 4.0–10.5)
nRBC: 0 % (ref 0.0–0.2)

## 2022-01-19 LAB — URINALYSIS, ROUTINE W REFLEX MICROSCOPIC
Bilirubin Urine: NEGATIVE
Glucose, UA: NEGATIVE mg/dL
Hgb urine dipstick: NEGATIVE
Ketones, ur: 5 mg/dL — AB
Leukocytes,Ua: NEGATIVE
Nitrite: POSITIVE — AB
Protein, ur: NEGATIVE mg/dL
Specific Gravity, Urine: 1.028 (ref 1.005–1.030)
pH: 5 (ref 5.0–8.0)

## 2022-01-19 LAB — COMPREHENSIVE METABOLIC PANEL
ALT: 22 U/L (ref 0–44)
AST: 40 U/L (ref 15–41)
Albumin: 3.3 g/dL — ABNORMAL LOW (ref 3.5–5.0)
Alkaline Phosphatase: 57 U/L (ref 38–126)
Anion gap: 9 (ref 5–15)
BUN: 13 mg/dL (ref 6–20)
CO2: 25 mmol/L (ref 22–32)
Calcium: 7.9 mg/dL — ABNORMAL LOW (ref 8.9–10.3)
Chloride: 109 mmol/L (ref 98–111)
Creatinine, Ser: 0.94 mg/dL (ref 0.44–1.00)
GFR, Estimated: >60 mL/min (ref >60)
Glucose, Bld: 73 mg/dL (ref 70–99)
Potassium: 3.4 mmol/L — ABNORMAL LOW (ref 3.5–5.1)
Sodium: 143 mmol/L (ref 135–145)
Total Bilirubin: 0.2 mg/dL — ABNORMAL LOW (ref 0.3–1.2)
Total Protein: 5.3 g/dL — ABNORMAL LOW (ref 6.5–8.1)

## 2022-01-19 LAB — I-STAT CHEM 8, ED
BUN: 14 mg/dL (ref 6–20)
Calcium, Ion: 1.06 mmol/L — ABNORMAL LOW (ref 1.15–1.40)
Chloride: 107 mmol/L (ref 98–111)
Creatinine, Ser: 0.9 mg/dL (ref 0.44–1.00)
Glucose, Bld: 74 mg/dL (ref 70–99)
HCT: 32 % — ABNORMAL LOW (ref 36.0–46.0)
Hemoglobin: 10.9 g/dL — ABNORMAL LOW (ref 12.0–15.0)
Potassium: 3.4 mmol/L — ABNORMAL LOW (ref 3.5–5.1)
Sodium: 145 mmol/L (ref 135–145)
TCO2: 25 mmol/L (ref 22–32)

## 2022-01-19 LAB — RAPID URINE DRUG SCREEN, HOSP PERFORMED
Amphetamines: NOT DETECTED
Barbiturates: NOT DETECTED
Benzodiazepines: NOT DETECTED
Cocaine: NOT DETECTED
Opiates: NOT DETECTED
Tetrahydrocannabinol: NOT DETECTED

## 2022-01-19 LAB — LACTIC ACID, PLASMA: Lactic Acid, Venous: 1.4 mmol/L (ref 0.5–1.9)

## 2022-01-19 LAB — RESP PANEL BY RT-PCR (RSV, FLU A&B, COVID)  RVPGX2
Influenza A by PCR: NEGATIVE
Influenza B by PCR: NEGATIVE
Resp Syncytial Virus by PCR: NEGATIVE
SARS Coronavirus 2 by RT PCR: NEGATIVE

## 2022-01-19 LAB — ACETAMINOPHEN LEVEL: Acetaminophen (Tylenol), Serum: <10 ug/mL — ABNORMAL LOW (ref 10–30)

## 2022-01-19 LAB — MAGNESIUM: Magnesium: 1.6 mg/dL — ABNORMAL LOW (ref 1.7–2.4)

## 2022-01-19 LAB — ETHANOL: Alcohol, Ethyl (B): <10 mg/dL (ref <10)

## 2022-01-19 LAB — TSH: TSH: 3.208 u[IU]/mL (ref 0.350–4.500)

## 2022-01-19 LAB — I-STAT BETA HCG BLOOD, ED (MC, WL, AP ONLY): I-stat hCG, quantitative: <5 m[IU]/mL (ref <5)

## 2022-01-19 MED ORDER — LACTATED RINGERS IV BOLUS
1000.0000 mL | Freq: Once | INTRAVENOUS | Status: AC
  Administered 2022-01-19: 1000 mL via INTRAVENOUS

## 2022-01-19 MED ORDER — GUAIFENESIN ER 600 MG PO TB12
1200.0000 mg | ORAL_TABLET | Freq: Two times a day (BID) | ORAL | Status: DC
  Administered 2022-01-19 – 2022-01-24 (×8): 1200 mg via ORAL
  Filled 2022-01-19 (×12): qty 2

## 2022-01-19 MED ORDER — SODIUM CHLORIDE 0.9 % IV SOLN
2.0000 g | Freq: Two times a day (BID) | INTRAVENOUS | Status: DC
  Administered 2022-01-19: 2 g via INTRAVENOUS
  Filled 2022-01-19: qty 12.5

## 2022-01-19 MED ORDER — ONDANSETRON HCL 4 MG/2ML IJ SOLN
4.0000 mg | Freq: Once | INTRAMUSCULAR | Status: AC
  Administered 2022-01-19: 4 mg via INTRAVENOUS
  Filled 2022-01-19: qty 2

## 2022-01-19 MED ORDER — SODIUM CHLORIDE 0.9 % IV SOLN: INTRAVENOUS | Status: AC

## 2022-01-19 MED ORDER — VANCOMYCIN HCL 1500 MG/300ML IV SOLN
1500.0000 mg | Freq: Once | INTRAVENOUS | Status: AC
  Administered 2022-01-19: 1500 mg via INTRAVENOUS
  Filled 2022-01-19: qty 300

## 2022-01-19 MED ORDER — ONDANSETRON HCL 4 MG/2ML IJ SOLN
4.0000 mg | Freq: Four times a day (QID) | INTRAMUSCULAR | Status: DC | PRN
  Administered 2022-01-20 – 2022-01-21 (×5): 4 mg via INTRAVENOUS
  Filled 2022-01-19 (×5): qty 2

## 2022-01-19 MED ORDER — METRONIDAZOLE 500 MG/100ML IV SOLN
500.0000 mg | Freq: Two times a day (BID) | INTRAVENOUS | Status: DC
  Administered 2022-01-19 – 2022-01-25 (×13): 500 mg via INTRAVENOUS
  Filled 2022-01-19 (×13): qty 100

## 2022-01-19 MED ORDER — SODIUM CHLORIDE 0.9 % IV SOLN
500.0000 mg | Freq: Once | INTRAVENOUS | Status: DC
  Administered 2022-01-19: 500 mg via INTRAVENOUS
  Filled 2022-01-19: qty 5

## 2022-01-19 MED ORDER — ONDANSETRON HCL 4 MG PO TABS: 4.0000 mg | ORAL_TABLET | Freq: Four times a day (QID) | ORAL | Status: DC | PRN

## 2022-01-19 MED ORDER — ACETAMINOPHEN 650 MG RE SUPP
650.0000 mg | Freq: Four times a day (QID) | RECTAL | Status: DC | PRN
  Administered 2022-01-22: 650 mg via RECTAL
  Filled 2022-01-19: qty 1

## 2022-01-19 MED ORDER — SULFAMETHOXAZOLE-TRIMETHOPRIM 800-160 MG PO TABS
1.0000 | ORAL_TABLET | Freq: Two times a day (BID) | ORAL | Status: DC
  Administered 2022-01-19 – 2022-01-24 (×7): 1 via ORAL
  Filled 2022-01-19 (×13): qty 1

## 2022-01-19 MED ORDER — POTASSIUM CHLORIDE CRYS ER 20 MEQ PO TBCR
40.0000 meq | EXTENDED_RELEASE_TABLET | Freq: Once | ORAL | Status: AC
  Administered 2022-01-19: 40 meq via ORAL
  Filled 2022-01-19: qty 2

## 2022-01-19 MED ORDER — VANCOMYCIN HCL 750 MG/150ML IV SOLN: 750.0000 mg | Freq: Two times a day (BID) | INTRAVENOUS | Status: DC

## 2022-01-19 MED ORDER — ACETAMINOPHEN 325 MG PO TABS
650.0000 mg | ORAL_TABLET | Freq: Four times a day (QID) | ORAL | Status: DC | PRN
  Administered 2022-01-19 – 2022-01-20 (×2): 650 mg via ORAL
  Filled 2022-01-19 (×2): qty 2

## 2022-01-19 MED ORDER — ACETAMINOPHEN 500 MG PO TABS
1000.0000 mg | ORAL_TABLET | Freq: Once | ORAL | Status: AC
  Administered 2022-01-19: 1000 mg via ORAL
  Filled 2022-01-19: qty 2

## 2022-01-19 MED ORDER — ENOXAPARIN SODIUM 40 MG/0.4ML IJ SOSY
40.0000 mg | PREFILLED_SYRINGE | INTRAMUSCULAR | Status: DC
  Administered 2022-01-19 – 2022-01-24 (×4): 40 mg via SUBCUTANEOUS
  Filled 2022-01-19 (×5): qty 0.4

## 2022-01-19 MED ORDER — TRIPLE ANTIBIOTIC 3.5-400-5000 EX OINT
1.0000 | TOPICAL_OINTMENT | Freq: Three times a day (TID) | CUTANEOUS | Status: DC
  Administered 2022-01-19 – 2022-01-20 (×4): 1 via CUTANEOUS
  Filled 2022-01-19 (×6): qty 1

## 2022-01-19 MED ORDER — MAGNESIUM OXIDE -MG SUPPLEMENT 400 (240 MG) MG PO TABS
400.0000 mg | ORAL_TABLET | Freq: Once | ORAL | Status: AC
  Administered 2022-01-19: 400 mg via ORAL
  Filled 2022-01-19: qty 1

## 2022-01-19 MED ORDER — IPRATROPIUM-ALBUTEROL 0.5-2.5 (3) MG/3ML IN SOLN: 3.0000 mL | Freq: Four times a day (QID) | RESPIRATORY_TRACT | Status: DC | PRN

## 2022-01-19 NOTE — Progress Notes (Signed)
CSW spoke with patient and advised she contact her attorney to let them know she is currently in the hospital. Patient was supposed to go to a substance abuse rehab facility today. CSW also spoke with patients father Lamar Blinks, [phone]  who has already contacted the attorney and probation officer.

## 2022-01-19 NOTE — Progress Notes (Signed)
Pharmacy Antibiotic Note  Monica Eaton is a 46 y.o. female admitted on 01/19/2022 presenting AMS, fever and tachycardia, hx of IVDU and SA bacteremia.  Pharmacy has been consulted for vancomycin dosing.  Gram negative coverage per MD  Plan: Vancomycin 1500 mg IV x 1, then 750 mg IV q 12h (eAUC 466) Monitor renal function, cx and clinical progression to narrow Vancomycin levels as indicated      Temp (24hrs), Avg:101.9 F (38.8 C), Min:101.9 F (38.8 C), Max:101.9 F (38.8 C)  Recent Labs  Lab 01/19/22 1019 01/19/22 1031  WBC 15.4*  --   CREATININE 0.94 0.90    CrCl cannot be calculated (Unknown ideal weight.).    Allergies  Allergen Reactions   Gabapentin Other (See Comments)    Causes extreme agitation.    Cefaclor    Cephalosporins    Nitrofurantoin    Penicillins     Spoke with patient's family. They report that Ms. Wisor had a rash/hives when she was 63-13 years old. She did not have any shortness of breath and they do not recall having to take her to the doctor's office or hospital to treat the reaction.     Bertis Ruddy, PharmD, [facility] Clinical Pharmacist ED Pharmacist Phone # [phone] 01/19/2022 12:08 PM

## 2022-01-19 NOTE — ED Triage Notes (Addendum)
Pt arrives via EMS from home. Patient's parents called EMS due to the patient being agitated, not able to sleep and pacing around her home. Pt was reportedly recently at a detention center and family reports she was supposed to go to a detox facility today. Reports she was recently released from jail, she states her last drug use was last month. Pt is febrile during triage. Reports chronic neck pain

## 2022-01-19 NOTE — ED Notes (Signed)
Pt to Xray.

## 2022-01-19 NOTE — H&P (Signed)
History and Physical    Monica Eaton ALP:[id number] DOB: Mar 06, 1975 DOA: 01/19/2022  PCP: Patient, No Pcp Per (Confirm with patient/family/NH records and if not entered, this has to be entered at [facility] point of entry) Patient coming from: Home  I have personally briefly reviewed patient's old medical records in [place]  Chief Complaint: Nauseous vomiting, cough and shortness of breath  HPI: Monica Eaton is a 46 y.o. female with medical history significant of IV drug abuse, MSSA cervical discitis/osteomyelitis with MSSA bacteremia, on chronic prophylactic Bactrim double strength, chronic hep C status posttreatment, presented with new onset of nauseous vomiting fever and cough.  Patient reported jail 2 days ago back to home after 3 days of staying in jail.  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  She claimed that she did not receive any Bactrim treatment in last 4 weeks.  Last night, patient restarted to use illicit drug, she reported sniffing 1 bag of "[place]" then she became very nauseous and vomited several times of stomach content.  Overnight, patient became shortness of breath with new onset of cough and she continued to vomited several times throughout the night.  She also reported episodes of chills and subjective fever.  Denies any urinary symptoms or diarrhea.  Family reported that patient became suddenly agitated last night, with confusion. ED Course: Fever of 101.9, tachycardia tachypneic and borderline hypoxia, O2 saturation dropped to 88% when talking.  Blood pressure 129/78.  Chest x-ray showed right middle lobe infiltrates suspicious for pneumonia.  Blood work showed patient WBC 15.4, creatinine 0.9, lactic acid 1.4.  1 1 bolus of 1000 mL NS and started on vancomycin cefepime and azithromycin.  Review of Systems: As  per HPI otherwise 14 point review of systems negative.    Past Medical History:  Diagnosis Date   Alcohol abuse    IV drug user    heroin   Neck pain, chronic    Neuropathy     Past Surgical History:  Procedure Laterality Date   AUGMENTATION MAMMAPLASTY Bilateral 2005   AUGMENTATION MAMMAPLASTY Bilateral 2012   BREAST ENHANCEMENT SURGERY     CESAREAN SECTION     FINGER FRACTURE SURGERY Left    left ring finger fracture.   RADIOLOGY WITH ANESTHESIA N/A 08/08/2019   Procedure: MRI WITH ANESTHESIA;  Surgeon: Radiologist, Medication, MD;  Location: [facility];  Service: Radiology;  Laterality: N/A;   WISDOM TOOTH EXTRACTION       reports that she has been smoking cigarettes. She has been smoking an average of 1 pack per day. She has been exposed to tobacco smoke. She has never used smokeless tobacco. She reports that she does not currently use alcohol. She reports current drug use. Drugs: Heroin and Fentanyl.  Allergies  Allergen Reactions   Gabapentin Other (See Comments)    Causes extreme agitation.    Cefaclor    Cephalosporins    Nitrofurantoin    Penicillins     Spoke with patient's family. They report that Monica Eaton had a rash/hives when she was 29-62 years old. She did not have any shortness of breath and they do not recall having to take her to the doctor's office or hospital to treat the reaction.     Family History  Problem Relation Age of Onset   Parkinson's disease Mother    Arthritis Father    Breast  cancer Neg Hx      Prior to Admission medications   Medication Sig Start Date End Date Taking? Authorizing Provider  ibuprofen (ADVIL) 600 MG tablet Take 1 tablet (600 mg total) by mouth 3 (three) times daily as needed. Patient not taking: Reported on 01/19/2022 07/20/21   Golden Circle, FNP  sulfamethoxazole-trimethoprim (BACTRIM DS) 800-160 MG tablet Take 1 tablet by mouth 2 (two) times daily. Patient not taking: Reported on 01/19/2022 07/20/21   Golden Circle, FNP    Physical Exam: Vitals:   01/19/22 1140 01/19/22 1141 01/19/22 1228 01/19/22 1315  BP:   90/72 124/74  Pulse: (!) 136 (!) 140 (!) 114 (!) 134  Resp: 19 16 (!) 21 (!) 24  Temp:      TempSrc:      SpO2: 92% 93% 93% 95%    Constitutional: NAD, calm, comfortable Vitals:   01/19/22 1140 01/19/22 1141 01/19/22 1228 01/19/22 1315  BP:   90/72 124/74  Pulse: (!) 136 (!) 140 (!) 114 (!) 134  Resp: 19 16 (!) 21 (!) 24  Temp:      TempSrc:      SpO2: 92% 93% 93% 95%   Eyes: PERRL, lids and conjunctivae normal ENMT: Mucous membranes are moist. Posterior pharynx clear of any exudate or lesions.Normal dentition.  Neck: normal, supple, no masses, no thyromegaly Respiratory: clear to auscultation bilaterally, no wheezing, coarse crackles on right lung.  Increasing respiratory effort. No accessory muscle use.  Cardiovascular: Regular rate and rhythm, no murmurs / rubs / gallops. No extremity edema. 2+ pedal pulses. No carotid bruits.  Abdomen: no tenderness, no masses palpated. No hepatosplenomegaly. Bowel sounds positive.  Musculoskeletal: no clubbing / cyanosis. No joint deformity upper and lower extremities. Good ROM, no contractures. Normal muscle tone.  Skin: no rashes, lesions, ulcers. No induration Neurologic: CN 2-12 grossly intact. Sensation intact, DTR normal. Strength 5/5 in all 4.  Psychiatric: Normal judgment and insight. Alert and oriented x 3. Normal mood.    Labs on Admission: I have personally reviewed following labs and imaging studies  CBC: Recent Labs  Lab 01/19/22 1019 01/19/22 1031  WBC 15.4*  --   NEUTROABS 12.5*  --   HGB 11.2* 10.9*  HCT 35.0* 32.0*  MCV 94.3  --   PLT 195  --    Basic Metabolic Panel: Recent Labs  Lab 01/19/22 1019 01/19/22 1031  NA 143 145  K 3.4* 3.4*  CL 109 107  CO2 25  --   GLUCOSE 73 74  BUN 13 14  CREATININE 0.94 0.90  CALCIUM 7.9*  --   MG 1.6*  --    GFR: CrCl cannot be calculated (Unknown ideal  weight.). Liver Function Tests: Recent Labs  Lab 01/19/22 1019  AST 40  ALT 22  ALKPHOS 57  BILITOT 0.2*  PROT 5.3*  ALBUMIN 3.3*   No results for input(s): "LIPASE", "AMYLASE" in the last 168 hours. No results for input(s): "AMMONIA" in the last 168 hours. Coagulation Profile: No results for input(s): "INR", "PROTIME" in the last 168 hours. Cardiac Enzymes: No results for input(s): "CKTOTAL", "CKMB", "CKMBINDEX", "TROPONINI" in the last 168 hours. BNP (last 3 results) No results for input(s): "PROBNP" in the last 8760 hours. HbA1C: No results for input(s): "HGBA1C" in the last 72 hours. CBG: No results for input(s): "GLUCAP" in the last 168 hours. Lipid Profile: No results for input(s): "CHOL", "HDL", "LDLCALC", "TRIG", "CHOLHDL", "LDLDIRECT" in the last 72 hours. Thyroid Function Tests: Recent  Labs    01/19/22 1019  TSH 3.208   Anemia Panel: No results for input(s): "VITAMINB12", "FOLATE", "FERRITIN", "TIBC", "IRON", "RETICCTPCT" in the last 72 hours. Urine analysis: No results found for: "COLORURINE", "APPEARANCEUR", "LABSPEC", "PHURINE", "GLUCOSEU", "HGBUR", "BILIRUBINUR", "KETONESUR", "PROTEINUR", "UROBILINOGEN", "NITRITE", "LEUKOCYTESUR"  Radiological Exams on Admission: CT Head Wo Contrast  Result Date: 01/19/2022 CLINICAL DATA:  Head trauma, abnormal mental status (Age 79-64y). Fever and tachycardia. EXAM: CT HEAD WITHOUT CONTRAST TECHNIQUE: Contiguous axial images were obtained from the base of the skull through the vertex without intravenous contrast. RADIATION DOSE REDUCTION: This exam was performed according to the departmental dose-optimization program which includes automated exposure control, adjustment of the mA and/or kV according to patient size and/or use of iterative reconstruction technique. COMPARISON:  None Available. FINDINGS: Brain: There is no evidence of an acute infarct, intracranial hemorrhage, midline shift, or extra-axial fluid collection. A 1.6  cm low-density/possibly cystic focus is noted anteriorly in the right temporal lobe with thinning of the overlying cortex. The brain is unremarkable in appearance elsewhere. The ventricles are normal in size. Vascular: No hyperdense vessel. Skull: No acute fracture or suspicious osseous lesion. Sinuses/Orbits: Visualized paranasal sinuses and mastoid air cells are clear. Unremarkable orbits. Other: None. IMPRESSION: 1. 1.6 cm low-density focus in the anterior right temporal lobe, indeterminate. While this may reflect a dilated perivascular space or cyst, a brain MRI without and with contrast is recommended for further characterization and to exclude a neoplasm. 2. Otherwise unremarkable CT appearance of the brain. Electronically Signed   By: Logan Bores M.D.   On: 01/19/2022 11:47   DG Chest 1 View  Result Date: 01/19/2022 CLINICAL DATA:  Fever, tachycardia. EXAM: CHEST  1 VIEW COMPARISON:  None Available. FINDINGS: The heart size and mediastinal contours are within normal limits. Right midlung opacity concerning for pneumonia. Partially imaged cervical spine hardware. IMPRESSION: Right midlung opacity concerning for pneumonia. Follow-up examination to resolution is recommended. Electronically Signed   By: Keane Police D.O.   On: 01/19/2022 10:18    EKG: Independently reviewed.  Sinus tachycardia, no acute ST changes.  Assessment/Plan Principal Problem:   Sepsis ([place]) Active Problems:   IVDU (intravenous drug user)   Epidural abscess   Aspiration pneumonia ([place])  (please populate well all problems here in Problem List. (For example, if patient is on BP meds at home and you resume or decide to hold them, it is a problem that needs to be her. Same for CAD, COPD, HLD and so on)  Severe Sepsis -Evidenced by tachycardia, fever and leukocytosis, with symptoms signs of endorgan damage of acute encephalopathy, infection source likely new onset of acute aspiration pneumonia. -Blood pressure stabilized  with initial 1 L bolus, will continue maintenance IV fluid for 24 hours. -Discussed with on-call infectious disease Dr. Linus Salmons about antibiotic regimen, as patient has history of severe penicillin and cephalosporin allergy, infection disease recommend restart patient on Bactrim DS plus Flagyl to cover aspiration pneumonia -Repeat chest x-ray in 4 weeks to document resolution of pneumonia. -Blood cultures drawn.  At this point, there is no indication for endocarditis workup unless patient persistently having fever or positive blood culture.  Acute metabolic encephalopathy -Likely secondary to sepsis, resolved after initial sepsis treatment.  CT head negative for structural changes.  IVDU -UDS pending -Patient wishes resuming drug rehab programs once recovered from pneumonia.  Remote MSSA bacteremia/epidural abscess/osteomyelitis on cervical spine -No acute concern, resume Bactrim DS prophylaxis.  DVT prophylaxis: Lovenox Code Status: Full code Family Communication:  None at bedside Disposition Plan: Patient is sick with sepsis and aspiration pneumonia, requiring inpatient antibiotic treatment, expect more than 2 midnight hospital stay. Consults called: ID for ABX regimen Admission status: Tele admit   Lequita Halt MD [facility] Pager [phone]  01/19/2022, 2:25 PM

## 2022-01-19 NOTE — ED Provider Notes (Addendum)
[facility] EMERGENCY DEPARTMENT Provider Note   CSN: [id number] Arrival date & time: 01/19/22  7846     History  Chief Complaint  Patient presents with   Fever   Tachycardia   Addiction Problem    Edit Ricciardelli is a 46 y.o. female with IVDU, h/o MSSA bacteremia, chronic hep C, h/o epidural abscess, tobacco abuse who presents with AMS, fever, tachycardia.   BIBEMS after parents called for agitation/AMS.  Patient endorses 28 day stay in jail where she was only able to shower twice. She endorses poor hygiene and was concerned she had a UTI d/t difficulty urinating and dysuria, unclear onset. Denies hematuria. Also endorses vaginal discharge of unclear onset without any itching/burning. Last period was while she was in jail. She states that she saw some fentanyl residue in the car last night and so consumed it. Denies CP, abdominal pain, but endorses nausea/vomiting and insomnia yesterday she thinks as a result of coming down from the fentanyl she consumed yesterday. She states that she has had emotional distress because she went to jail as a result of calling 911 when someone passed away from drug use. She denies SI/HI/VH/AH.   Parents at bedside provide additional history. They picked her up from [facility] last night and brought her back to start her sentence in the drug rehab facility today. Dad notes that she was "agitated" last night when they tried to go to [employer] and not redirectable. He got her up this AM and she stumbled and fell, did not hit her head, seemed fatigued and not herself, so he called 911.   Patient denies flu-like symptoms, cough, CP, sore throat, headache, double vision/blurry vision, numbness/tingling, neck stiffness. She was unaware she had a fever until arriving here. Tachy to 140s and febrile to 101.9 F on arrival.   Fever      Home Medications Prior to Admission medications   Medication Sig Start Date End Date Taking? Authorizing  Provider  ibuprofen (ADVIL) 600 MG tablet Take 1 tablet (600 mg total) by mouth 3 (three) times daily as needed. 07/20/21   Golden Circle, FNP  sulfamethoxazole-trimethoprim (BACTRIM DS) 800-160 MG tablet Take 1 tablet by mouth 2 (two) times daily. 07/20/21   Golden Circle, FNP      Allergies    Gabapentin, Cefaclor, Cephalosporins, Nitrofurantoin, and Penicillins    Review of Systems   Review of Systems  Constitutional:  Positive for fever.   Review of systems Negative for cough, CP.  A 10 point review of systems was performed and is negative unless otherwise reported in HPI.  Physical Exam Updated Vital Signs BP 129/78   Pulse (!) 139   Temp (!) 101.9 F (38.8 C) (Oral)   Resp 17   SpO2 95%  Physical Exam General: Normal appearing female, lying in bed.  HEENT: PERRLA, Sclera anicteric, MMM, trachea midline.  Cardiology: Tachycardic regular rate, no murmurs/rubs/gallops. BL radial and DP pulses equal bilaterally.  Resp: Normal respiratory rate and effort. CTAB, no wheezes, rhonchi, crackles.  Abd: Soft, non-tender, non-distended. No rebound tenderness or guarding.  GU: Deferred. MSK: No peripheral edema or signs of trauma. Extremities without deformity or TTP. No cyanosis or clubbing. Skin: warm, dry. No rashes or lesions. Back: No CVA tenderness Neuro: A&Ox4, CNs II-XII grossly intact. MAEs. Sensation grossly intact.  Psych: Anxious, tearful, tangential mood and affect.   ED Results / Procedures / Treatments   Labs (all labs ordered are listed, but only abnormal results are  displayed) Labs Reviewed  I-STAT CHEM 8, ED - Abnormal; Notable for the following components:      Result Value   Potassium 3.4 (*)    Calcium, Ion 1.06 (*)    Hemoglobin 10.9 (*)    HCT 32.0 (*)    All other components within normal limits  URINE CULTURE  RESP PANEL BY RT-PCR (RSV, FLU A&B, COVID)  [id number]  CULTURE, BLOOD (ROUTINE X 2)  CULTURE, BLOOD (ROUTINE X 2)  CBC WITH  DIFFERENTIAL/PLATELET  COMPREHENSIVE METABOLIC PANEL  TSH  RAPID URINE DRUG SCREEN, HOSP PERFORMED  ACETAMINOPHEN LEVEL  ETHANOL  SALICYLATE LEVEL  MAGNESIUM  URINALYSIS, ROUTINE W REFLEX MICROSCOPIC  I-STAT BETA HCG BLOOD, ED ([facility], [facility], [facility] ONLY)    EKG EKG Interpretation  Date/Time:  Thursday January 19 2022 10:25:12 EST Ventricular Rate:  139 PR Interval:  122 QRS Duration: 60 QT Interval:  282 QTC Calculation: 429 R Axis:   78 Text Interpretation: Sinus tachycardia Otherwise normal ECG No previous ECGs available Confirmed by Cindee Lame [phone]) on 01/19/2022 10:54:51 AM  Radiology DG Chest 1 View  Result Date: 01/19/2022 CLINICAL DATA:  Fever, tachycardia. EXAM: CHEST  1 VIEW COMPARISON:  None Available. FINDINGS: The heart size and mediastinal contours are within normal limits. Right midlung opacity concerning for pneumonia. Partially imaged cervical spine hardware. IMPRESSION: Right midlung opacity concerning for pneumonia. Follow-up examination to resolution is recommended. Electronically Signed   By: Keane Police D.O.   On: 01/19/2022 10:18    Procedures .Critical Care  Performed by: Audley Hose, MD Authorized by: Audley Hose, MD   Critical care provider statement:    Critical care time (minutes):  60   Critical care was necessary to treat or prevent imminent or life-threatening deterioration of the following conditions:  Sepsis   Critical care was time spent personally by me on the following activities:  Development of treatment plan with patient or surrogate, discussions with consultants, evaluation of patient's response to treatment, examination of patient, ordering and review of laboratory studies, ordering and review of radiographic studies, ordering and performing treatments and interventions, pulse oximetry, re-evaluation of patient's condition, review of old charts and obtaining history from patient or surrogate   Care discussed with: admitting provider        Medications Ordered in ED Medications  acetaminophen (TYLENOL) tablet 1,000 mg (has no administration in time range)  azithromycin (ZITHROMAX) 500 mg in sodium chloride 0.9 % 250 mL IVPB (has no administration in time range)  ceFEPIme (MAXIPIME) 2 g in sodium chloride 0.9 % 100 mL IVPB (has no administration in time range)  lactated ringers bolus 1,000 mL (1,000 mLs Intravenous Bolus 01/19/22 1038)    ED Course/ Medical Decision Making/ A&P                          Medical Decision Making Amount and/or Complexity of Data Reviewed Labs: ordered. Decision-making details documented in ED Course. Radiology: ordered. Decision-making details documented in ED Course.  Risk OTC drugs. Prescription drug management. Decision regarding hospitalization.    This patient presents to the ED for concern of fall, AMS, fever/tachycardia; this involves an extensive number of treatment options, and is a complaint that carries with it a high risk of complications and morbidity.  I considered the following differential and admission for this acute, potentially life threatening condition.  Patient is febrile/tachycardic.  MDM:    Ddx of acute altered mental status or encephalopathy  considered but not limited to: -Intracranial abnormalities such as ICH, hydrocephalus, head trauma -Infection such as UTI, PNA. Great c/f sepsis given tachycardia and fever on arrival. +Sxs of UTI, will get urine. Patient has no cough/SOB but will obtain CXR given c/f bacteremia and jail time. No abd pain or suprapubic/adnexal tenderness on palpation to consider PID/TOA but endorses nonspecific vaginal discharge, cannot perform pelvic at this time given that patient is roomed in a hallway bed. Can have patient self swab for the time being. Considered meningitis given h/o AMS reported by father and fever/tachycardia, patient with no headache or neck stiffness at this time, will eval for other causes. Patient does have h/o  MSSA bacteremia, blood cultures drawn. Will start broad spectrum abx including vancomycin and fluid resuscitation. -Toxic ingestion such as opioid overdose. Patient is alert at this time with 75m pupils, states last fentanyl use was last night. Patient is slightly tangential and emotional currently, anxious. Consider toxic or illegal co-ingestion based on agitation/vitals and affect.  -Electrolyte abnormalities or hyper/hypoglycemia -Hepatic encephalopathy or uremia -ACS or arrhythmia -Endocrine abnormality such as thyroid storm or myxedema coma  Clinical Course as of 01/19/22 1325  Thu Jan 19, 2022  1056 DG Chest 1 View FINDINGS: The heart size and mediastinal contours are within normal limits. Right midlung opacity concerning for pneumonia. Partially imaged cervical spine hardware.  IMPRESSION: Right midlung opacity concerning for pneumonia. Follow-up examination to resolution is recommended.   [HN]  1056 EKG ST w/ no signs of ischemia [HN]  1059 Allergy listed to cephalosporins but per documentation received cefazolin for mssa bacteremia in 2021. Will treat with azithromycin and cefepime IV.  [HN]  1204 WBC(!): 15.4 [HN]  1204 Hemoglobin(!): 11.2 [HN]  1204 Magnesium(!): 1.6 Repleting [HN]  1204 Resp panel by RT-PCR (RSV, Flu A&B, Covid) Anterior Nasal Swab Neg [HN]  1204 CT Head Wo Contrast IMPRESSION: 1. 1.6 cm low-density focus in the anterior right temporal lobe, indeterminate. While this may reflect a dilated perivascular space or cyst, a brain MRI without and with contrast is recommended for further characterization and to exclude a neoplasm. 2. Otherwise unremarkable CT appearance of the brain.   [HN]  1205 TSH: 3.208 [HN]  1206 Patient still tachy in 130s after 1L IVF, will start a 2nd [HN]  1300 Pulse Rate(!): 114 Improving [HN]  1320 Lactic Acid, Venous: 1.4 [HN]    Clinical Course User Index [HN] NAudley Hose MD     Labs: I Ordered, and personally  interpreted labs.  The pertinent results include those listed above  Imaging Studies ordered: I ordered imaging studies including CXR, CTH I independently visualized and interpreted imaging. I agree with the radiologist interpretation  Additional history obtained from parents, chart review  Cardiac Monitoring: The patient was maintained on a cardiac monitor.  I personally viewed and interpreted the cardiac monitored which showed an underlying rhythm of: ST  Reevaluation: After the interventions noted above, I reevaluated the patient and found that they have :improved  Social Determinants of Health: Patient lives independently, h/o IV opiate use including fentanyl   Disposition:  Admitted to medicine. Pending UA, UDS, urine culture, blood cultures. S/p 2L IVF, vancomycin, cefepime, azithromycin.    Co morbidities that complicate the patient evaluation  Past Medical History:  Diagnosis Date   Alcohol abuse    IV drug user    heroin   Neck pain, chronic    Neuropathy      Medicines Meds ordered this encounter  Medications  lactated ringers bolus 1,000 mL   acetaminophen (TYLENOL) tablet 1,000 mg   azithromycin (ZITHROMAX) 500 mg in sodium chloride 0.9 % 250 mL IVPB   ceFEPIme (MAXIPIME) 2 g in sodium chloride 0.9 % 100 mL IVPB    Order Specific Question:   Antibiotic Indication:    Answer:   Bacteremia    I have reviewed the patients home medicines and have made adjustments as needed  Problem List / ED Course: Problem List Items Addressed This Visit   None Visit Diagnoses     Pneumonia of right middle lobe due to infectious organism    -  Primary   Relevant Medications   azithromycin (ZITHROMAX) 500 mg in sodium chloride 0.9 % 250 mL IVPB   ceFEPIme (MAXIPIME) 2 g in sodium chloride 0.9 % 100 mL IVPB        This note was created using dictation software, which may contain spelling or grammatical errors.      Audley Hose, MD 01/19/22 1341

## 2022-01-20 ENCOUNTER — Inpatient Hospital Stay (HOSPITAL_COMMUNITY): Payer: Medicaid Other

## 2022-01-20 DIAGNOSIS — F1990 Other psychoactive substance use, unspecified, uncomplicated: Secondary | ICD-10-CM

## 2022-01-20 DIAGNOSIS — R112 Nausea with vomiting, unspecified: Secondary | ICD-10-CM

## 2022-01-20 DIAGNOSIS — J690 Pneumonitis due to inhalation of food and vomit: Secondary | ICD-10-CM

## 2022-01-20 DIAGNOSIS — J189 Pneumonia, unspecified organism: Secondary | ICD-10-CM

## 2022-01-20 LAB — CBC
HCT: 24.3 % — ABNORMAL LOW (ref 36.0–46.0)
Hemoglobin: 7.9 g/dL — ABNORMAL LOW (ref 12.0–15.0)
MCH: 31 pg (ref 26.0–34.0)
MCHC: 32.5 g/dL (ref 30.0–36.0)
MCV: 95.3 fL (ref 80.0–100.0)
Platelets: 122 10*3/uL — ABNORMAL LOW (ref 150–400)
RBC: 2.55 MIL/uL — ABNORMAL LOW (ref 3.87–5.11)
RDW: 13 % (ref 11.5–15.5)
WBC: 10.6 10*3/uL — ABNORMAL HIGH (ref 4.0–10.5)
nRBC: 0 % (ref 0.0–0.2)

## 2022-01-20 LAB — HEMOGLOBIN AND HEMATOCRIT, BLOOD
HCT: 31.7 % — ABNORMAL LOW (ref 36.0–46.0)
Hemoglobin: 10.6 g/dL — ABNORMAL LOW (ref 12.0–15.0)

## 2022-01-20 LAB — BASIC METABOLIC PANEL
Anion gap: 9 (ref 5–15)
BUN: 9 mg/dL (ref 6–20)
CO2: 21 mmol/L — ABNORMAL LOW (ref 22–32)
Calcium: 7.8 mg/dL — ABNORMAL LOW (ref 8.9–10.3)
Chloride: 110 mmol/L (ref 98–111)
Creatinine, Ser: 0.89 mg/dL (ref 0.44–1.00)
GFR, Estimated: >60 mL/min (ref >60)
Glucose, Bld: 68 mg/dL — ABNORMAL LOW (ref 70–99)
Potassium: 4.1 mmol/L (ref 3.5–5.1)
Sodium: 140 mmol/L (ref 135–145)

## 2022-01-20 LAB — LIPASE, BLOOD: Lipase: 26 U/L (ref 11–51)

## 2022-01-20 LAB — MRSA NEXT GEN BY PCR, NASAL: MRSA by PCR Next Gen: NOT DETECTED

## 2022-01-20 LAB — MAGNESIUM: Magnesium: 1.8 mg/dL (ref 1.7–2.4)

## 2022-01-20 MED ORDER — METOCLOPRAMIDE HCL 5 MG/ML IJ SOLN
10.0000 mg | Freq: Four times a day (QID) | INTRAMUSCULAR | Status: AC
  Administered 2022-01-20 – 2022-01-21 (×4): 10 mg via INTRAVENOUS
  Filled 2022-01-20 (×4): qty 2

## 2022-01-20 MED ORDER — PNEUMOCOCCAL 20-VAL CONJ VACC 0.5 ML IM SUSY
0.5000 mL | PREFILLED_SYRINGE | INTRAMUSCULAR | Status: AC
  Administered 2022-01-21: 0.5 mL via INTRAMUSCULAR
  Filled 2022-01-20: qty 0.5

## 2022-01-20 MED ORDER — RINGERS IV SOLN: INTRAVENOUS | Status: AC

## 2022-01-20 NOTE — Progress Notes (Signed)
[facility]                                                                              Monica Eaton, is a 46 y.o. female, DOB - [birth year], [place] date - 01/19/2022    Outpatient Primary MD for the Monica Eaton is Monica Eaton, No Pcp Per  LOS - 1  days  Chief Complaint  Monica Eaton presents with   Fever   Tachycardia   Addiction Problem       Brief summary   Monica Eaton is a 46 year old female with history of IV drug use, MSSA cervical discitis/osteomyelitis with MSSA bacteremia on chronic prophylactic Bactrim DS, chronic hep C status post treatment presented with new onset of nausea vomiting, fevers and cough.  Monica Eaton reported 28-day stay in jail, just got home, was supposed to go to a detox facility on 12/14 to complete her sentence.   Monica Eaton was on chronic Bactrim DS treatment for previous epidural abscess/discitis/osteomyelitis of her neck and cervical spine, MSSA bacteremia in 2021.  She reports that she did not receive any Bactrim treatment in the last 4 weeks she was not present.  The night before the admission, she restarted using illicit drugs, reported sniffing 1 bag of " cotton", then she became very nauseous and vomited several times.  Per ED note, she had reported that she saw some fentanyl residue in the car and had consumed it.  Overnight, she became short of breath with new onset of cough and continued to vomit several times throughout the night.  She also reported chills and subjective fevers, no urinary symptoms or diarrhea.  Monica Eaton's family reported that she became suddenly became agitated with confusion.  In ED, temp 101.9 F, tachycardia heart rate up to 140s, tachypnea RR 24 and borderline hypoxia, O2 sats dropped to 88% on talking.  BP initially 90/72, improved to 124/74 Lactic acid 1.4, creatinine 0.9, WBCs 15.4, chest x-ray showed right middle lobe infiltrate suspicious for pneumonia.   Assessment & Plan    Principal Problem: Severe  sepsis ([place]) with aspiration pneumonia, POA -Monica Eaton met severe sepsis criteria on admission with hypotension, tachycardia, fevers, leukocytosis, tachypnea, endorgan damage of acute encephalopathy, source likely due to aspiration pneumonia -Continue IV fluids.  Admitting physician, Dr. Roosevelt Locks discussed with ID, Dr. Linus Salmons as Monica Eaton has several allergies including penicillin and cephalosporin.  ID recommended to restart Bactrim DS and add Flagyl to cover aspiration pneumonia -Repeat chest x-ray in 4 weeks to ensure resolution of PNA -Follow blood cultures, so far NTD   Active Problems:  Nausea and vomiting -Noted to have nausea and vomiting, obtain abdominal x-ray, placed on scheduled IV Reglan, continue Zofran as needed -Obtain lipase, placed on clear liquid diet -Continue IV fluid hydration     IVDU (intravenous drug user) -Counseled strongly on cessation, Monica Eaton will resume detox once she is stable for discharge.    Hypokalemia, hypomagnesemia -Was replaced, now resolved  History of epidural abscess, osteomyelitis on cervical spine, MSSA bacteremia -No acute concerns, resumed Bactrim DS prophylaxis   Dehydration -Secondary to nausea and vomiting, placed on IV fluid hydration  Acute metabolic encephalopathy -Likely due to drug use,  aspiration pneumonia and dehydration -Currently alert and oriented, appears at baseline  Obesity Estimated body mass index is 31.2 kg/m as calculated from the following:   Height as of this encounter: _0  (1.549 m).   Weight as of this encounter: 74.9 kg.  Code Status: Full CODE STATUS DVT Prophylaxis:  enoxaparin (LOVENOX) injection 40 mg Start: 01/19/22 2200   Level of Care: Level of care: Telemetry Medical Family Communication: Updated Monica Eaton  Disposition Plan:      Remains inpatient appropriate: Currently not stable for discharge due to intractable nausea and vomiting, dehydration.  May need 24 hours of IV fluids, antibiotics, advance  diet as tolerated.  Procedures:  None  Consultants:   Infectious disease  Antimicrobials:   Anti-infectives (From admission, onward)    Start     Dose/Rate Route Frequency Ordered Stop   01/20/22 0100  vancomycin (VANCOREADY) IVPB 750 mg/150 mL  Status:  Discontinued        750 mg 150 mL/hr over 60 Minutes Intravenous Every 12 hours 01/19/22 1212 01/19/22 1427   01/19/22 2200  sulfamethoxazole-trimethoprim (BACTRIM DS) 800-160 MG per tablet 1 tablet        1 tablet Oral Every 12 hours 01/19/22 1428     01/19/22 1430  metroNIDAZOLE (FLAGYL) IVPB 500 mg        500 mg 100 mL/hr over 60 Minutes Intravenous Every 12 hours 01/19/22 1428     01/19/22 1215  vancomycin (VANCOREADY) IVPB 1500 mg/300 mL        1,500 mg 150 mL/hr over 120 Minutes Intravenous  Once 01/19/22 1212 01/19/22 1702   01/19/22 1100  azithromycin (ZITHROMAX) 500 mg in sodium chloride 0.9 % 250 mL IVPB  Status:  Discontinued        500 mg 250 mL/hr over 60 Minutes Intravenous  Once 01/19/22 1056 01/19/22 1427   01/19/22 1100  ceFEPIme (MAXIPIME) 2 g in sodium chloride 0.9 % 100 mL IVPB  Status:  Discontinued        2 g 200 mL/hr over 30 Minutes Intravenous Every 12 hours 01/19/22 1059 01/19/22 1427          Medications  enoxaparin (LOVENOX) injection  40 mg Subcutaneous Q24H   guaiFENesin  1,200 mg Oral BID   metoCLOPramide (REGLAN) injection  10 mg Intravenous Q6H   neomycin-bacitracin-polymyxin  1 Application Apply externally TID   sulfamethoxazole-trimethoprim  1 tablet Oral Q12H      Subjective:   Monica Eaton was seen and examined today.  States she is feeling miserable, having dry heaves and nausea.  No active vomiting during exam.  Low-grade temp 100.3 F.  Alert and oriented.  Objective:   Vitals:   01/20/22 0750 01/20/22 0905 01/20/22 0930 01/20/22 1000  BP:  129/75 125/73 112/65  Pulse:  (!) 103 (!) 103 (!) 108  Resp:  _1 Temp: 100.3 F (37.9 C)     TempSrc: Oral     SpO2:   98% 95% 95%  Weight:      Height:        Intake/Output Summary (Last 24 hours) at 01/20/2022 1042 Last data filed at 01/20/2022 1040 Gross per 24 hour  Intake 2374.37 ml  Output --  Net 2374.37 ml     Wt Readings from Last 3 Encounters:  01/19/22 74.9 kg  10/04/21 74.9 kg  08/02/21 76.8 kg     Exam General: Alert and oriented x 3, NAD Cardiovascular: S1 S2 auscultated,  RRR Respiratory: Coarse  rhonchi right lung Gastrointestinal: Soft, nontender, nondistended, + bowel sounds Ext: no pedal edema bilaterally Neuro: Strength 5/5 upper and lower extremities bilaterally Psych: somewhat anxious    Data Reviewed:  I have personally reviewed following labs    CBC Lab Results  Component Value Date   WBC 10.6 (H) 01/20/2022   RBC 2.55 (L) 01/20/2022   HGB 10.6 (L) 01/20/2022   HCT 31.7 (L) 01/20/2022   MCV 95.3 01/20/2022   MCH 31.0 01/20/2022   PLT 122 (L) 01/20/2022   MCHC 32.5 01/20/2022   RDW 13.0 01/20/2022   LYMPHSABS 1.6 01/19/2022   MONOABS 1.2 (H) 01/19/2022   EOSABS 0.0 01/19/2022   BASOSABS 0.0 63/14/9702     Last metabolic panel Lab Results  Component Value Date   NA 140 01/20/2022   K 4.1 01/20/2022   CL 110 01/20/2022   CO2 21 (L) 01/20/2022   BUN 9 01/20/2022   CREATININE 0.89 01/20/2022   GLUCOSE 68 (L) 01/20/2022   GFRNONAA >60 01/20/2022   GFRAA >60 09/03/2019   CALCIUM 7.8 (L) 01/20/2022   PHOS 3.6 08/09/2019   PROT 5.3 (L) 01/19/2022   ALBUMIN 3.3 (L) 01/19/2022   BILITOT 0.2 (L) 01/19/2022   ALKPHOS 57 01/19/2022   AST 40 01/19/2022   ALT 22 01/19/2022   ANIONGAP 9 01/20/2022    CBG (last 3)  No results for input(s): "GLUCAP" in the last 72 hours.    Coagulation Profile: No results for input(s): "INR", "PROTIME" in the last 168 hours.   Radiology Studies: I have personally reviewed the imaging studies  CT Head Wo Contrast  Result Date: 01/19/2022 CLINICAL DATA:  Head trauma, abnormal mental status (Age 60-64y). Fever  and tachycardia. EXAM: CT HEAD WITHOUT CONTRAST TECHNIQUE: Contiguous axial images were obtained from the base of the skull through the vertex without intravenous contrast. RADIATION DOSE REDUCTION: This exam was performed according to the departmental dose-optimization program which includes automated exposure control, adjustment of the mA and/or kV according to Monica Eaton size and/or use of iterative reconstruction technique. COMPARISON:  None Available. FINDINGS: Brain: There is no evidence of an acute infarct, intracranial hemorrhage, midline shift, or extra-axial fluid collection. A 1.6 cm low-density/possibly cystic focus is noted anteriorly in the right temporal lobe with thinning of the overlying cortex. The brain is unremarkable in appearance elsewhere. The ventricles are normal in size. Vascular: No hyperdense vessel. Skull: No acute fracture or suspicious osseous lesion. Sinuses/Orbits: Visualized paranasal sinuses and mastoid air cells are clear. Unremarkable orbits. Other: None. IMPRESSION: 1. 1.6 cm low-density focus in the anterior right temporal lobe, indeterminate. While this may reflect a dilated perivascular space or cyst, a brain MRI without and with contrast is recommended for further characterization and to exclude a neoplasm. 2. Otherwise unremarkable CT appearance of the brain. Electronically Signed   By: Logan Bores M.D.   On: 01/19/2022 11:47   DG Chest 1 View  Result Date: 01/19/2022 CLINICAL DATA:  Fever, tachycardia. EXAM: CHEST  1 VIEW COMPARISON:  None Available. FINDINGS: The heart size and mediastinal contours are within normal limits. Right midlung opacity concerning for pneumonia. Partially imaged cervical spine hardware. IMPRESSION: Right midlung opacity concerning for pneumonia. Follow-up examination to resolution is recommended. Electronically Signed   By: Keane Police D.O.   On: 01/19/2022 10:18       Roselene Gray M.D. [facility] 01/20/2022, 10:42  AM  Available via Epic secure chat 7am-7pm After 7 pm, please refer to night coverage provider listed  on amion.

## 2022-01-20 NOTE — ED Notes (Signed)
Pt unable to eat meal tray at this time. Pt provided with clear liquids and fruit. Pt educated on nutritional value during healing process.

## 2022-01-20 NOTE — ED Notes (Signed)
Pt ambulated to bathroom with the assistance of one nursing assistant

## 2022-01-21 ENCOUNTER — Inpatient Hospital Stay (HOSPITAL_COMMUNITY): Payer: Medicaid Other

## 2022-01-21 DIAGNOSIS — F1990 Other psychoactive substance use, unspecified, uncomplicated: Secondary | ICD-10-CM | POA: Diagnosis not present

## 2022-01-21 DIAGNOSIS — G062 Extradural and subdural abscess, unspecified: Secondary | ICD-10-CM

## 2022-01-21 DIAGNOSIS — J189 Pneumonia, unspecified organism: Secondary | ICD-10-CM | POA: Diagnosis not present

## 2022-01-21 DIAGNOSIS — J690 Pneumonitis due to inhalation of food and vomit: Secondary | ICD-10-CM | POA: Diagnosis not present

## 2022-01-21 LAB — CBC
HCT: 30.8 % — ABNORMAL LOW (ref 36.0–46.0)
Hemoglobin: 10.1 g/dL — ABNORMAL LOW (ref 12.0–15.0)
MCH: 31.1 pg (ref 26.0–34.0)
MCHC: 32.8 g/dL (ref 30.0–36.0)
MCV: 94.8 fL (ref 80.0–100.0)
Platelets: 166 10*3/uL (ref 150–400)
RBC: 3.25 MIL/uL — ABNORMAL LOW (ref 3.87–5.11)
RDW: 12.8 % (ref 11.5–15.5)
WBC: 12 10*3/uL — ABNORMAL HIGH (ref 4.0–10.5)
nRBC: 0 % (ref 0.0–0.2)

## 2022-01-21 LAB — URINE CULTURE: Culture: <10000 — AB

## 2022-01-21 LAB — BASIC METABOLIC PANEL
Anion gap: 12 (ref 5–15)
BUN: 8 mg/dL (ref 6–20)
CO2: 19 mmol/L — ABNORMAL LOW (ref 22–32)
Calcium: 7.7 mg/dL — ABNORMAL LOW (ref 8.9–10.3)
Chloride: 110 mmol/L (ref 98–111)
Creatinine, Ser: 0.82 mg/dL (ref 0.44–1.00)
GFR, Estimated: >60 mL/min (ref >60)
Glucose, Bld: 57 mg/dL — ABNORMAL LOW (ref 70–99)
Potassium: 3.8 mmol/L (ref 3.5–5.1)
Sodium: 141 mmol/L (ref 135–145)

## 2022-01-21 MED ORDER — RINGERS IV SOLN
INTRAVENOUS | Status: DC
  Filled 2022-01-21: qty 1000

## 2022-01-21 MED ORDER — PROCHLORPERAZINE EDISYLATE 10 MG/2ML IJ SOLN
10.0000 mg | INTRAMUSCULAR | Status: DC | PRN
  Filled 2022-01-21 (×2): qty 2

## 2022-01-21 MED ORDER — LORAZEPAM 1 MG PO TABS
1.0000 mg | ORAL_TABLET | Freq: Two times a day (BID) | ORAL | Status: DC
  Administered 2022-01-21 – 2022-01-25 (×6): 1 mg via ORAL
  Filled 2022-01-21 (×8): qty 1

## 2022-01-21 MED ORDER — LACTATED RINGERS IV SOLN
INTRAVENOUS | Status: DC
  Administered 2022-01-24: 1000 mL via INTRAVENOUS

## 2022-01-21 MED ORDER — BACITRACIN-NEOMYCIN-POLYMYXIN OINTMENT TUBE
1.0000 | TOPICAL_OINTMENT | Freq: Three times a day (TID) | CUTANEOUS | Status: DC
  Administered 2022-01-21 – 2022-01-25 (×9): 1 via TOPICAL
  Filled 2022-01-21 (×2): qty 14

## 2022-01-21 MED ORDER — METOCLOPRAMIDE HCL 5 MG/ML IJ SOLN
10.0000 mg | Freq: Four times a day (QID) | INTRAMUSCULAR | Status: DC
  Administered 2022-01-21 – 2022-01-25 (×17): 10 mg via INTRAVENOUS
  Filled 2022-01-21 (×17): qty 2

## 2022-01-21 NOTE — Progress Notes (Signed)
[facility]                                                                              Monica Eaton, is a 46 y.o. female, DOB - 02-May-1975, Gray date - 01/19/2022    Outpatient Primary MD for the patient is Patient, No Pcp Per  LOS - 2  days  Chief Complaint  Patient presents with   Fever   Tachycardia   Addiction Problem       Brief summary   Patient is a 46 year old female with history of IV drug use, MSSA cervical discitis/osteomyelitis with MSSA bacteremia on chronic prophylactic Bactrim DS, chronic hep C status post treatment presented with new onset of nausea vomiting, fevers and cough.  Patient reported 28-day stay in jail, just got home, was supposed to go to a detox facility on 12/14 to complete her sentence.   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  She reports that she did not receive any Bactrim treatment in the last 4 weeks she was not present.  The night before the admission, she restarted using illicit drugs, reported sniffing 1 bag of " cotton", then she became very nauseous and vomited several times.  Per ED note, she had reported that she saw some fentanyl residue in the car and had consumed it.  Overnight, she became short of breath with new onset of cough and continued to vomit several times throughout the night.  She also reported chills and subjective fevers, no urinary symptoms or diarrhea.  Patient's family reported that she became suddenly became agitated with confusion.  In ED, temp 101.9 F, tachycardia heart rate up to 140s, tachypnea RR 24 and borderline hypoxia, O2 sats dropped to 88% on talking.  BP initially 90/72, improved to 124/74 Lactic acid 1.4, creatinine 0.9, WBCs 15.4, chest x-ray showed right middle lobe infiltrate suspicious for pneumonia.   Assessment & Plan    Principal Problem: Severe  sepsis ([place]) with aspiration pneumonia, POA -Patient met severe sepsis criteria on admission with hypotension, tachycardia, fevers, leukocytosis, tachypnea, endorgan damage of acute encephalopathy, source likely due to aspiration pneumonia -Admitting physician, Dr. Roosevelt Locks discussed with ID, Dr. Linus Salmons as patient has several allergies including penicillin and cephalosporin.  ID recommended to restart Bactrim DS and add Flagyl to cover aspiration pneumonia -Repeat chest x-ray in 4 weeks to ensure resolution of PNA -Continue IV fluid hydration   Active Problems:  Persistent nausea and vomiting -Unclear etiology, abdominal x-ray negative -Lipase 26, continue scheduled IV Reglan -Continue fluid hydration, obtain CT abdomen/pelvis -Change to IV Compazine for refractory nausea vomiting -Added Ativan 1 mg twice daily for refractory nausea and vomiting    IVDU (intravenous drug user) -Counseled strongly on cessation, patient will resume detox once she is stable for discharge.   Hypokalemia, hypomagnesemia -Resolved  History of epidural abscess, osteomyelitis on cervical spine, MSSA bacteremia -No acute concerns, resumed Bactrim DS prophylaxis   Dehydration -Secondary to nausea and vomiting, placed on IV fluid hydration  Acute metabolic encephalopathy -Likely due to drug use, aspiration pneumonia and  dehydration -Alert and oriented, at baseline  Obesity Estimated body mass index is 24.12 kg/m as calculated from the following:   Height as of this encounter: _0  (1.702 m).   Weight as of this encounter: 69.9 kg.  Code Status: Full CODE STATUS DVT Prophylaxis:  enoxaparin (LOVENOX) injection 40 mg Start: 01/19/22 2200   Level of Care: Level of care: Telemetry Medical Family Communication: Updated patient  Disposition Plan:      Remains inpatient appropriate: Currently not stable for discharge due to intractable nausea and vomiting, dehydration.   Procedures:  None  Consultants:    Infectious disease  Antimicrobials:   Anti-infectives (From admission, onward)    Start     Dose/Rate Route Frequency Ordered Stop   01/20/22 0100  vancomycin (VANCOREADY) IVPB 750 mg/150 mL  Status:  Discontinued        750 mg 150 mL/hr over 60 Minutes Intravenous Every 12 hours 01/19/22 1212 01/19/22 1427   01/19/22 2200  sulfamethoxazole-trimethoprim (BACTRIM DS) 800-160 MG per tablet 1 tablet        1 tablet Oral Every 12 hours 01/19/22 1428     01/19/22 1430  metroNIDAZOLE (FLAGYL) IVPB 500 mg        500 mg 100 mL/hr over 60 Minutes Intravenous Every 12 hours 01/19/22 1428     01/19/22 1215  vancomycin (VANCOREADY) IVPB 1500 mg/300 mL        1,500 mg 150 mL/hr over 120 Minutes Intravenous  Once 01/19/22 1212 01/19/22 1702   01/19/22 1100  azithromycin (ZITHROMAX) 500 mg in sodium chloride 0.9 % 250 mL IVPB  Status:  Discontinued        500 mg 250 mL/hr over 60 Minutes Intravenous  Once 01/19/22 1056 01/19/22 1427   01/19/22 1100  ceFEPIme (MAXIPIME) 2 g in sodium chloride 0.9 % 100 mL IVPB  Status:  Discontinued        2 g 200 mL/hr over 30 Minutes Intravenous Every 12 hours 01/19/22 1059 01/19/22 1427          Medications  enoxaparin (LOVENOX) injection  40 mg Subcutaneous Q24H   guaiFENesin  1,200 mg Oral BID   neomycin-bacitracin-polymyxin  1 Application Topical TID   sulfamethoxazole-trimethoprim  1 tablet Oral Q12H      Subjective:   Monica Eaton was seen and examined today.  States she continues to have nausea and vomiting, not able to tolerate more than clear liquids.  No fevers.  No abdominal pain.    Objective:   Vitals:   01/21/22 0101 01/21/22 0636 01/21/22 0723 01/21/22 1119  BP:  (!) 149/96 (!) 147/92 (!) 145/84  Pulse:  99 98 98  Resp:  _1 Temp:  98.7 F (37.1 C) 98.3 F (36.8 C)   TempSrc:  Oral Oral   SpO2:  94% 96% 95%  Weight: 69.9 kg     Height:        Intake/Output Summary (Last 24 hours) at 01/21/2022 1229 Last data  filed at 01/21/2022 0744 Gross per 24 hour  Intake 2579.77 ml  Output 1050 ml  Net 1529.77 ml     Wt Readings from Last 3 Encounters:  01/21/22 69.9 kg  10/04/21 74.9 kg  08/02/21 76.8 kg   Physical Exam General: Alert and oriented x 3, NAD Cardiovascular: S1 S2 clear, RRR.  Respiratory: Scattered rhonchi right lung Gastrointestinal: Soft, nontender, nondistended, NBS Ext: no pedal edema bilaterally Neuro: no new deficits Psych: Normal affect  Data Reviewed:  I have personally reviewed following labs    CBC Lab Results  Component Value Date   WBC 12.0 (H) 01/21/2022   RBC 3.25 (L) 01/21/2022   HGB 10.1 (L) 01/21/2022   HCT 30.8 (L) 01/21/2022   MCV 94.8 01/21/2022   MCH 31.1 01/21/2022   PLT 166 01/21/2022   MCHC 32.8 01/21/2022   RDW 12.8 01/21/2022   LYMPHSABS 1.6 01/19/2022   MONOABS 1.2 (H) 01/19/2022   EOSABS 0.0 01/19/2022   BASOSABS 0.0 84/21/0312     Last metabolic panel Lab Results  Component Value Date   NA 141 01/21/2022   K 3.8 01/21/2022   CL 110 01/21/2022   CO2 19 (L) 01/21/2022   BUN 8 01/21/2022   CREATININE 0.82 01/21/2022   GLUCOSE 57 (L) 01/21/2022   GFRNONAA >60 01/21/2022   GFRAA >60 09/03/2019   CALCIUM 7.7 (L) 01/21/2022   PHOS 3.6 08/09/2019   PROT 5.3 (L) 01/19/2022   ALBUMIN 3.3 (L) 01/19/2022   BILITOT 0.2 (L) 01/19/2022   ALKPHOS 57 01/19/2022   AST 40 01/19/2022   ALT 22 01/19/2022   ANIONGAP 12 01/21/2022    CBG (last 3)  No results for input(s): "GLUCAP" in the last 72 hours.    Coagulation Profile: No results for input(s): "INR", "PROTIME" in the last 168 hours.   Radiology Studies: I have personally reviewed the imaging studies  DG Abd Portable 2V  Result Date: 01/20/2022 CLINICAL DATA:  Nausea EXAM: PORTABLE ABDOMEN - 2 VIEW COMPARISON:  None Available. FINDINGS: The bowel gas pattern is normal. There is no evidence of free air. No radio-opaque calculi or other significant radiographic abnormality  is seen. No acute bony findings. IMPRESSION: Negative. Electronically Signed   By: Davina Poke D.O.   On: 01/20/2022 11:04       Aeva Posey M.D. [facility] 01/21/2022, 12:29 PM  Available via Epic secure chat 7am-7pm After 7 pm, please refer to night coverage provider listed on amion.

## 2022-01-22 ENCOUNTER — Inpatient Hospital Stay (HOSPITAL_COMMUNITY): Payer: Medicaid Other

## 2022-01-22 DIAGNOSIS — R112 Nausea with vomiting, unspecified: Secondary | ICD-10-CM | POA: Diagnosis not present

## 2022-01-22 DIAGNOSIS — R1013 Epigastric pain: Secondary | ICD-10-CM | POA: Insufficient documentation

## 2022-01-22 DIAGNOSIS — R933 Abnormal findings on diagnostic imaging of other parts of digestive tract: Secondary | ICD-10-CM

## 2022-01-22 DIAGNOSIS — Z8619 Personal history of other infectious and parasitic diseases: Secondary | ICD-10-CM

## 2022-01-22 DIAGNOSIS — F1990 Other psychoactive substance use, unspecified, uncomplicated: Secondary | ICD-10-CM | POA: Diagnosis not present

## 2022-01-22 DIAGNOSIS — J189 Pneumonia, unspecified organism: Secondary | ICD-10-CM | POA: Diagnosis not present

## 2022-01-22 DIAGNOSIS — J690 Pneumonitis due to inhalation of food and vomit: Secondary | ICD-10-CM | POA: Diagnosis not present

## 2022-01-22 DIAGNOSIS — R1010 Upper abdominal pain, unspecified: Secondary | ICD-10-CM

## 2022-01-22 LAB — CBC
HCT: 30.2 % — ABNORMAL LOW (ref 36.0–46.0)
Hemoglobin: 10.2 g/dL — ABNORMAL LOW (ref 12.0–15.0)
MCH: 31.1 pg (ref 26.0–34.0)
MCHC: 33.8 g/dL (ref 30.0–36.0)
MCV: 92.1 fL (ref 80.0–100.0)
Platelets: 205 10*3/uL (ref 150–400)
RBC: 3.28 MIL/uL — ABNORMAL LOW (ref 3.87–5.11)
RDW: 12.9 % (ref 11.5–15.5)
WBC: 9.3 10*3/uL (ref 4.0–10.5)
nRBC: 0 % (ref 0.0–0.2)

## 2022-01-22 LAB — COMPREHENSIVE METABOLIC PANEL
ALT: 25 U/L (ref 0–44)
AST: 38 U/L (ref 15–41)
Albumin: 2.7 g/dL — ABNORMAL LOW (ref 3.5–5.0)
Alkaline Phosphatase: 59 U/L (ref 38–126)
Anion gap: 13 (ref 5–15)
BUN: <5 mg/dL — ABNORMAL LOW (ref 6–20)
CO2: 14 mmol/L — ABNORMAL LOW (ref 22–32)
Calcium: 7.8 mg/dL — ABNORMAL LOW (ref 8.9–10.3)
Chloride: 113 mmol/L — ABNORMAL HIGH (ref 98–111)
Creatinine, Ser: 0.75 mg/dL (ref 0.44–1.00)
GFR, Estimated: >60 mL/min (ref >60)
Glucose, Bld: 57 mg/dL — ABNORMAL LOW (ref 70–99)
Potassium: 4 mmol/L (ref 3.5–5.1)
Sodium: 140 mmol/L (ref 135–145)
Total Bilirubin: 0.7 mg/dL (ref 0.3–1.2)
Total Protein: 4.9 g/dL — ABNORMAL LOW (ref 6.5–8.1)

## 2022-01-22 LAB — HEPATITIS PANEL, ACUTE
HCV Ab: REACTIVE — AB
Hep A IgM: NONREACTIVE
Hep B C IgM: NONREACTIVE
Hepatitis B Surface Ag: NONREACTIVE

## 2022-01-22 MED ORDER — SODIUM CHLORIDE 0.9 % IV SOLN
12.5000 mg | Freq: Three times a day (TID) | INTRAVENOUS | Status: DC
  Administered 2022-01-22 – 2022-01-25 (×10): 12.5 mg via INTRAVENOUS
  Filled 2022-01-22 (×7): qty 0.5
  Filled 2022-01-22: qty 12.5
  Filled 2022-01-22 (×2): qty 0.5
  Filled 2022-01-22: qty 12.5
  Filled 2022-01-22: qty 0.5
  Filled 2022-01-22: qty 12.5

## 2022-01-22 MED ORDER — PANTOPRAZOLE SODIUM 40 MG IV SOLR
40.0000 mg | Freq: Two times a day (BID) | INTRAVENOUS | Status: DC
  Administered 2022-01-22 (×2): 40 mg via INTRAVENOUS
  Filled 2022-01-22 (×3): qty 10

## 2022-01-22 NOTE — Consult Note (Addendum)
Consultation  Referring Provider:     Estill Cotta MD Primary Care Physician:  Patient, No Pcp Per Primary Gastroenterologist: Althia Forts        Reason for Consultation:     nausea and vomiting         HPI:   Monica Eaton is a 46 y.o. female with history of substance abuse, hepatitis C status post therapy, MSSA bacteremia on chronic prophylactic Bactrim, admitted to the hospital with nausea vomiting, cough and fevers.  She reportedly had a relapse of her drug use and restarted using drugs night prior to admission.  She states she was using only fentanyl and no other illicit substances.  She had cough and shortness of breath, with nausea vomiting, and chills.  In the ED she was found to be febrile and tachycardic and hypoxic.  WBC of 15.4 with a right middle lobe infiltrate suspicious for aspiration pneumonia.  She met sepsis criteria on admission, was given antibiotics, Bactrim double strength and Flagyl per ID given she has several allergies to other antibiotics.  Unfortunately since has been here she has had persistent nausea with vomiting and intolerance of p.o.  She states she is very nauseated.  She does have some upper abdominal discomfort with this, has some tenderness in her epigastric to right upper quadrant area.  She had a CT scan done of her abdomen pelvis yesterday which showed some suspected cholelithiasis with pericholecystic fluid but no clear evidence of cholecystitis.  She had a follow-up right upper quadrant ultrasound which shows mild diffuse gallbladder wall thickening and pericholecystic fluid without stones or sludge.  She has a HIDA scan that was ordered and pending to further assess for cholecystitis.  She has been tried on Zofran initially which did not help.  She was subsequently transition to Compazine and Reglan.  She states none of these antiemetics have really helped her yet.  EKG shows normal QTc. Her liver enzymes have been normal, her lipase is normal.   Her WBC peaked at 15.4 and is down trended to 9.3 today.  She has a mild anemia that is normocytic.  She takes occasional ibuprofen but no routine NSAID use.  She had an acute hepatitis panel that was sent and positive hepatitis C antibody.  She states she completed therapy for hep C within the past year or so and was negative at completion of therapy.  She has never had a prior EGD.  She reportedly has had some nausea and vomiting in recent weeks that has bothered her, however when asked about that today she denies that and states it is more acute.  She appears very uncomfortable in the room today, states she is nauseous and retching into the emesis basin.  Past Medical History:  Diagnosis Date   Alcohol abuse    IV drug user    heroin   Neck pain, chronic    Neuropathy     Past Surgical History:  Procedure Laterality Date   AUGMENTATION MAMMAPLASTY Bilateral 2005   AUGMENTATION MAMMAPLASTY Bilateral 2012   BREAST ENHANCEMENT SURGERY     CESAREAN SECTION     FINGER FRACTURE SURGERY Left    left ring finger fracture.   RADIOLOGY WITH ANESTHESIA N/A 08/08/2019   Procedure: MRI WITH ANESTHESIA;  Surgeon: Radiologist, Medication, MD;  Location: [facility];  Service: Radiology;  Laterality: N/A;   WISDOM TOOTH EXTRACTION      Family History  Problem Relation Age of Onset   Parkinson's disease Mother  Arthritis Father    Breast cancer Neg Hx      Social History   Tobacco Use   Smoking status: Every Day    Packs/day: 1.00    Types: Cigarettes    Passive exposure: Current   Smokeless tobacco: Never  Vaping Use   Vaping Use: Never used  Substance Use Topics   Alcohol use: Not Currently   Drug use: Yes    Types: Heroin, Fentanyl    Comment: everyday    Prior to Admission medications   Medication Sig Start Date End Date Taking? Authorizing Provider  ibuprofen (ADVIL) 600 MG tablet Take 1 tablet (600 mg total) by mouth 3 (three) times daily as needed. Patient not taking:  Reported on 01/19/2022 07/20/21   Golden Circle, FNP  sulfamethoxazole-trimethoprim (BACTRIM DS) 800-160 MG tablet Take 1 tablet by mouth 2 (two) times daily. Patient not taking: Reported on 01/19/2022 07/20/21   Golden Circle, FNP    Current Facility-Administered Medications  Medication Dose Route Frequency Provider Last Rate Last Admin   acetaminophen (TYLENOL) tablet 650 mg  650 mg Oral Q6H PRN Wynetta Fines T, MD   650 mg at 01/20/22 0854   Or   acetaminophen (TYLENOL) suppository 650 mg  650 mg Rectal Q6H PRN Wynetta Fines T, MD   650 mg at 01/22/22 1350   enoxaparin (LOVENOX) injection 40 mg  40 mg Subcutaneous Q24H Wynetta Fines T, MD   40 mg at 01/20/22 2034   guaiFENesin (MUCINEX) 12 hr tablet 1,200 mg  1,200 mg Oral BID Wynetta Fines T, MD   1,200 mg at 01/22/22 1035   ipratropium-albuterol (DUONEB) 0.5-2.5 (3) MG/3ML nebulizer solution 3 mL  3 mL Nebulization Q6H PRN Wynetta Fines T, MD       lactated ringers infusion   Intravenous Continuous Rai, Ripudeep K, MD 75 mL/hr at 01/22/22 0314 New Bag at 01/22/22 0314   LORazepam (ATIVAN) tablet 1 mg  1 mg Oral BID Rai, Ripudeep K, MD   1 mg at 01/22/22 1035   metoCLOPramide (REGLAN) injection 10 mg  10 mg Intravenous Q6H Rai, Ripudeep K, MD   10 mg at 01/22/22 1327   metroNIDAZOLE (FLAGYL) IVPB 500 mg  500 mg Intravenous Q12H Wynetta Fines T, MD 100 mL/hr at 01/22/22 1338 500 mg at 01/22/22 1338   neomycin-bacitracin-polymyxin (NEOSPORIN) ointment 1 Application  1 Application Topical TID Wynetta Fines T, MD   1 Application at 16/10/96 1037   pantoprazole (PROTONIX) injection 40 mg  40 mg Intravenous Q12H Rai, Ripudeep K, MD   40 mg at 01/22/22 1336   prochlorperazine (COMPAZINE) injection 10 mg  10 mg Intravenous Q4H PRN Rai, Ripudeep K, MD       ringers solution   Intravenous Continuous Rai, Ripudeep K, MD       sulfamethoxazole-trimethoprim (BACTRIM DS) 800-160 MG per tablet 1 tablet  1 tablet Oral Q12H Wynetta Fines T, MD   1 tablet at 01/22/22  1035    Allergies as of 01/19/2022 - Review Complete 01/19/2022  Allergen Reaction Noted   Gabapentin Other (See Comments) 08/19/2019   Cefaclor  07/15/2012   Cephalosporins  08/07/2019   Nitrofurantoin  07/12/2018   Penicillins  07/15/2012     Review of Systems:    As per HPI, otherwise negative    Physical Exam:  Vital signs in last 24 hours: Temp:  [98.3 F (36.8 C)-98.9 F (37.2 C)] 98.3 F (36.8 C) (12/17 1048) Pulse Rate:  [106] 106 (12/17 1048)  Resp:  [17-19] 19 (12/17 1048) BP: (145-161)/(89-95) 161/95 (12/17 1048) SpO2:  [95 %-100 %] 100 % (12/17 1048) Weight:  [67.9 kg] 67.9 kg (12/17 0502) Last BM Date : 01/18/22 General:   Pleasant female - vomiting into emesis basin Head:  Normocephalic and atraumatic. Eyes:   No icterus.   Conjunctiva pink. Ears:  Normal auditory acuity. Neck:  Supple Lungs:  Respirations even and unlabored.    Heart:  Regular rate and rhythm; no MRG Abdomen:  Soft, nondistended, some upper abdominal TTP  Rectal:  Not performed.  Msk:  Symmetrical without gross deformities.  Extremities:  Without edema. Neurologic:  Alert and  oriented x4;  grossly normal neurologically. Psych:  Alert and cooperative. Normal affect.  LAB RESULTS: Recent Labs    01/20/22 0350 01/20/22 0608 01/21/22 0057 01/22/22 0650  WBC 10.6*  --  12.0* 9.3  HGB 7.9* 10.6* 10.1* 10.2*  HCT 24.3* 31.7* 30.8* 30.2*  PLT 122*  --  166 205   BMET Recent Labs    01/20/22 0608 01/21/22 0057 01/22/22 0650  NA 140 141 140  K 4.1 3.8 4.0  CL 110 110 113*  CO2 21* 19* 14*  GLUCOSE 68* 57* 57*  BUN 9 8 <5*  CREATININE 0.89 0.82 0.75  CALCIUM 7.8* 7.7* 7.8*   LFT Recent Labs    01/22/22 0650  PROT 4.9*  ALBUMIN 2.7*  AST 38  ALT 25  ALKPHOS 59  BILITOT 0.7   PT/INR No results for input(s): "LABPROT", "INR" in the last 72 hours.  STUDIES: US Abdomen Limited RUQ (LIVER/GB)  Result Date: 01/22/2022 CLINICAL DATA:  Cholelithiasis EXAM: ULTRASOUND  ABDOMEN LIMITED RIGHT UPPER QUADRANT COMPARISON:  CT 01/21/2022, ultrasound 08/28/2019 FINDINGS: Gallbladder: Mild diffuse gallbladder wall thickening. Mild pericholecystic fluid. No sludge or stones are seen within the gallbladder. No sonographic Murphy sign noted by sonographer. Common bile duct: Diameter: 3 mm. Liver: No focal lesion identified. Within normal limits in parenchymal echogenicity. Portal vein is patent on color Doppler imaging with normal direction of blood flow towards the liver. Other: None. IMPRESSION: 1. Mild diffuse gallbladder wall thickening and pericholecystic fluid. No sludge or stones are seen within the gallbladder. Findings are nonspecific and could be due to underlying liver disease. If there is high clinical suspicion for cholecystitis, consider nuclear medicine hepatobiliary scan. 2. Unremarkable sonographic appearance of the liver. Electronically Signed   By: Davina Poke D.O.   On: 01/22/2022 09:55   CT ABDOMEN PELVIS WO CONTRAST  Result Date: 01/21/2022 CLINICAL DATA:  Abdominal pain, nausea/vomiting EXAM: CT ABDOMEN AND PELVIS WITHOUT CONTRAST TECHNIQUE: Multidetector CT imaging of the abdomen and pelvis was performed following the standard protocol without IV contrast. RADIATION DOSE REDUCTION: This exam was performed according to the departmental dose-optimization program which includes automated exposure control, adjustment of the mA and/or kV according to patient size and/or use of iterative reconstruction technique. COMPARISON:  None Available. FINDINGS: Lower chest: Small bilateral pleural effusions. Mild patchy/ground-glass opacity in the right lower lobe, favoring pneumonia. Bilateral breast augmentation. Hepatobiliary: Unenhanced liver is unremarkable. Suspected tiny layering gallstones (series 3/image 33), without gallbladder wall thickening or distension, but with pericholecystic fluid. No intrahepatic or extrahepatic duct dilatation. Pancreas: Within normal  limits. Spleen: Within normal limits. Adrenals/Urinary Tract: Adrenal glands are within normal limits. Kidneys are grossly unremarkable, noting mild perinephric fluid along the right upper kidney. No hydronephrosis. Bladder is within normal limits. Stomach/Bowel: Stomach is notable for a tiny hiatal hernia. No evidence of bowel obstruction. Normal  appendix (series 3/image 69). No colonic wall thickening or inflammatory changes. Vascular/Lymphatic: No evidence of abdominal aortic aneurysm. Atherosclerotic calcifications of the abdominal aorta and branch vessels. No suspicious abdominopelvic lymphadenopathy. Reproductive: Uterus and right ovary are within normal limits. Multiple left ovarian cysts/follicles measuring up to 4.9 cm (series 3/image 31), measuring simple fluid density, benign. No dedicated follow-up is recommended. Other: Trace pelvic ascites. Musculoskeletal: Visualized osseous structures are within normal limits. IMPRESSION: Suspected cholelithiasis with pericholecystic fluid, but without additional findings to suggest acute cholecystitis on CT. This appearance could also be secondary to a right upper quadrant inflammatory process such as hepatitis. Correlate with laboratory evaluation and consider right upper quadrant ultrasound as clinically warranted. Small bilateral pleural effusions. Mild patchy/ground-glass opacity in the right lower lobe, favoring pneumonia. Additional ancillary findings as above. Electronically Signed   By: Julian Hy M.D.   On: 01/21/2022 20:05     PREVIOUS ENDOSCOPIES:            none   Impression / Plan:   46 year old female admitted with the following:  Refractory nausea and vomiting Aspiration pneumonia causing sepsis Illicit drug use History of hepatitis C  As above, patient with recurrent fentanyl use, came in with aspiration pneumonia meeting criteria for sepsis in the setting of refractory nausea and vomiting.  This has been reportedly ongoing a  few weeks in her chart, however she states this is more just acute in recent days.   She appears very uncomfortable in her bed on rounds today. She has not done well with trial of Zofran, Reglan, Compazine thus far.  Her liver enzymes and lipase are normal.  CT scan led to right upper quadrant ultrasound.  There are no gallstones or sludge in the gallbladder on ultrasound however her gallbladder does appear inflamed.  She does have some upper abdominal tenderness on exam.  With normal liver enzymes cholecystitis seems less likely however she is getting HIDA scan today to rule that out.  If the HIDA is normal, we can consider an upper endoscopy to clear her upper tract although her CT scan did not show any concerning process there.  Perhaps symptoms otherwise reactive to her recent drug use / aspiration pneumonia.  I would try modifying her antiemetics at this point and add Phenergan if she has not tried that yet in place of Compazine.  QTc on EKG is normal.  If HIDA scan is negative, her symptoms persist despite trial of variety of antiemetics, then can consider an EGD although this may not reveal a clear source.  She does have some occasional NSAIDs use.  On PPI currently.  I tentatively saved her spot for EGD tomorrow morning if she continues to feel poorly. Otherwise given her recurrent drug use, would rescreen for HCV with that HCVRNA as her antibody will always be positive.   PLAN: - stop compazine, add scheduled Phenergan - continue empiric protonix - await HIDA scan - tentatively scheduled for EGD tomorrow AM to save a spot for her in case she needs it, can cancel if she feels improved - HCV RNA  Will follow, Dr. Bryan Lemma to assume inpatient GI service tomorrow.  Jolly Mango, MD [facility]

## 2022-01-22 NOTE — H&P (View-Only) (Signed)
Consultation  Referring Provider:     Estill Cotta MD Primary Care Physician:  Patient, No Pcp Per Primary Gastroenterologist: Althia Forts        Reason for Consultation:     nausea and vomiting         HPI:   Monica Eaton is a 46 y.o. female with history of substance abuse, hepatitis C status post therapy, MSSA bacteremia on chronic prophylactic Bactrim, admitted to the hospital with nausea vomiting, cough and fevers.  She reportedly had a relapse of her drug use and restarted using drugs night prior to admission.  She states she was using only fentanyl and no other illicit substances.  She had cough and shortness of breath, with nausea vomiting, and chills.  In the ED she was found to be febrile and tachycardic and hypoxic.  WBC of 15.4 with a right middle lobe infiltrate suspicious for aspiration pneumonia.  She met sepsis criteria on admission, was given antibiotics, Bactrim double strength and Flagyl per ID given she has several allergies to other antibiotics.  Unfortunately since has been here she has had persistent nausea with vomiting and intolerance of p.o.  She states she is very nauseated.  She does have some upper abdominal discomfort with this, has some tenderness in her epigastric to right upper quadrant area.  She had a CT scan done of her abdomen pelvis yesterday which showed some suspected cholelithiasis with pericholecystic fluid but no clear evidence of cholecystitis.  She had a follow-up right upper quadrant ultrasound which shows mild diffuse gallbladder wall thickening and pericholecystic fluid without stones or sludge.  She has a HIDA scan that was ordered and pending to further assess for cholecystitis.  She has been tried on Zofran initially which did not help.  She was subsequently transition to Compazine and Reglan.  She states none of these antiemetics have really helped her yet.  EKG shows normal QTc. Her liver enzymes have been normal, her lipase is normal.   Her WBC peaked at 15.4 and is down trended to 9.3 today.  She has a mild anemia that is normocytic.  She takes occasional ibuprofen but no routine NSAID use.  She had an acute hepatitis panel that was sent and positive hepatitis C antibody.  She states she completed therapy for hep C within the past year or so and was negative at completion of therapy.  She has never had a prior EGD.  She reportedly has had some nausea and vomiting in recent weeks that has bothered her, however when asked about that today she denies that and states it is more acute.  She appears very uncomfortable in the room today, states she is nauseous and retching into the emesis basin.  Past Medical History:  Diagnosis Date   Alcohol abuse    IV drug user    heroin   Neck pain, chronic    Neuropathy     Past Surgical History:  Procedure Laterality Date   AUGMENTATION MAMMAPLASTY Bilateral 2005   AUGMENTATION MAMMAPLASTY Bilateral 2012   BREAST ENHANCEMENT SURGERY     CESAREAN SECTION     FINGER FRACTURE SURGERY Left    left ring finger fracture.   RADIOLOGY WITH ANESTHESIA N/A 08/08/2019   Procedure: MRI WITH ANESTHESIA;  Surgeon: Radiologist, Medication, MD;  Location: [facility];  Service: Radiology;  Laterality: N/A;   WISDOM TOOTH EXTRACTION      Family History  Problem Relation Age of Onset   Parkinson's disease Mother  Arthritis Father    Breast cancer Neg Hx      Social History   Tobacco Use   Smoking status: Every Day    Packs/day: 1.00    Types: Cigarettes    Passive exposure: Current   Smokeless tobacco: Never  Vaping Use   Vaping Use: Never used  Substance Use Topics   Alcohol use: Not Currently   Drug use: Yes    Types: Heroin, Fentanyl    Comment: everyday    Prior to Admission medications   Medication Sig Start Date End Date Taking? Authorizing Provider  ibuprofen (ADVIL) 600 MG tablet Take 1 tablet (600 mg total) by mouth 3 (three) times daily as needed. Patient not taking:  Reported on 01/19/2022 07/20/21   Golden Circle, FNP  sulfamethoxazole-trimethoprim (BACTRIM DS) 800-160 MG tablet Take 1 tablet by mouth 2 (two) times daily. Patient not taking: Reported on 01/19/2022 07/20/21   Golden Circle, FNP    Current Facility-Administered Medications  Medication Dose Route Frequency Provider Last Rate Last Admin   acetaminophen (TYLENOL) tablet 650 mg  650 mg Oral Q6H PRN Wynetta Fines T, MD   650 mg at 01/20/22 0854   Or   acetaminophen (TYLENOL) suppository 650 mg  650 mg Rectal Q6H PRN Wynetta Fines T, MD   650 mg at 01/22/22 1350   enoxaparin (LOVENOX) injection 40 mg  40 mg Subcutaneous Q24H Wynetta Fines T, MD   40 mg at 01/20/22 2034   guaiFENesin (MUCINEX) 12 hr tablet 1,200 mg  1,200 mg Oral BID Wynetta Fines T, MD   1,200 mg at 01/22/22 1035   ipratropium-albuterol (DUONEB) 0.5-2.5 (3) MG/3ML nebulizer solution 3 mL  3 mL Nebulization Q6H PRN Wynetta Fines T, MD       lactated ringers infusion   Intravenous Continuous Rai, Ripudeep K, MD 75 mL/hr at 01/22/22 0314 New Bag at 01/22/22 0314   LORazepam (ATIVAN) tablet 1 mg  1 mg Oral BID Rai, Ripudeep K, MD   1 mg at 01/22/22 1035   metoCLOPramide (REGLAN) injection 10 mg  10 mg Intravenous Q6H Rai, Ripudeep K, MD   10 mg at 01/22/22 1327   metroNIDAZOLE (FLAGYL) IVPB 500 mg  500 mg Intravenous Q12H Wynetta Fines T, MD 100 mL/hr at 01/22/22 1338 500 mg at 01/22/22 1338   neomycin-bacitracin-polymyxin (NEOSPORIN) ointment 1 Application  1 Application Topical TID Wynetta Fines T, MD   1 Application at 23/76/28 1037   pantoprazole (PROTONIX) injection 40 mg  40 mg Intravenous Q12H Rai, Ripudeep K, MD   40 mg at 01/22/22 1336   prochlorperazine (COMPAZINE) injection 10 mg  10 mg Intravenous Q4H PRN Rai, Ripudeep K, MD       ringers solution   Intravenous Continuous Rai, Ripudeep K, MD       sulfamethoxazole-trimethoprim (BACTRIM DS) 800-160 MG per tablet 1 tablet  1 tablet Oral Q12H Wynetta Fines T, MD   1 tablet at 01/22/22  1035    Allergies as of 01/19/2022 - Review Complete 01/19/2022  Allergen Reaction Noted   Gabapentin Other (See Comments) 08/19/2019   Cefaclor  07/15/2012   Cephalosporins  08/07/2019   Nitrofurantoin  07/12/2018   Penicillins  07/15/2012     Review of Systems:    As per HPI, otherwise negative    Physical Exam:  Vital signs in last 24 hours: Temp:  [98.3 F (36.8 C)-98.9 F (37.2 C)] 98.3 F (36.8 C) (12/17 1048) Pulse Rate:  [106] 106 (12/17 1048)  Resp:  [17-19] 19 (12/17 1048) BP: (145-161)/(89-95) 161/95 (12/17 1048) SpO2:  [95 %-100 %] 100 % (12/17 1048) Weight:  [67.9 kg] 67.9 kg (12/17 0502) Last BM Date : 01/18/22 General:   Pleasant female - vomiting into emesis basin Head:  Normocephalic and atraumatic. Eyes:   No icterus.   Conjunctiva pink. Ears:  Normal auditory acuity. Neck:  Supple Lungs:  Respirations even and unlabored.    Heart:  Regular rate and rhythm; no MRG Abdomen:  Soft, nondistended, some upper abdominal TTP  Rectal:  Not performed.  Msk:  Symmetrical without gross deformities.  Extremities:  Without edema. Neurologic:  Alert and  oriented x4;  grossly normal neurologically. Psych:  Alert and cooperative. Normal affect.  LAB RESULTS: Recent Labs    01/20/22 0350 01/20/22 0608 01/21/22 0057 01/22/22 0650  WBC 10.6*  --  12.0* 9.3  HGB 7.9* 10.6* 10.1* 10.2*  HCT 24.3* 31.7* 30.8* 30.2*  PLT 122*  --  166 205   BMET Recent Labs    01/20/22 0608 01/21/22 0057 01/22/22 0650  NA 140 141 140  K 4.1 3.8 4.0  CL 110 110 113*  CO2 21* 19* 14*  GLUCOSE 68* 57* 57*  BUN 9 8 <5*  CREATININE 0.89 0.82 0.75  CALCIUM 7.8* 7.7* 7.8*   LFT Recent Labs    01/22/22 0650  PROT 4.9*  ALBUMIN 2.7*  AST 38  ALT 25  ALKPHOS 59  BILITOT 0.7   PT/INR No results for input(s): "LABPROT", "INR" in the last 72 hours.  STUDIES: US Abdomen Limited RUQ (LIVER/GB)  Result Date: 01/22/2022 CLINICAL DATA:  Cholelithiasis EXAM: ULTRASOUND  ABDOMEN LIMITED RIGHT UPPER QUADRANT COMPARISON:  CT 01/21/2022, ultrasound 08/28/2019 FINDINGS: Gallbladder: Mild diffuse gallbladder wall thickening. Mild pericholecystic fluid. No sludge or stones are seen within the gallbladder. No sonographic Murphy sign noted by sonographer. Common bile duct: Diameter: 3 mm. Liver: No focal lesion identified. Within normal limits in parenchymal echogenicity. Portal vein is patent on color Doppler imaging with normal direction of blood flow towards the liver. Other: None. IMPRESSION: 1. Mild diffuse gallbladder wall thickening and pericholecystic fluid. No sludge or stones are seen within the gallbladder. Findings are nonspecific and could be due to underlying liver disease. If there is high clinical suspicion for cholecystitis, consider nuclear medicine hepatobiliary scan. 2. Unremarkable sonographic appearance of the liver. Electronically Signed   By: Davina Poke D.O.   On: 01/22/2022 09:55   CT ABDOMEN PELVIS WO CONTRAST  Result Date: 01/21/2022 CLINICAL DATA:  Abdominal pain, nausea/vomiting EXAM: CT ABDOMEN AND PELVIS WITHOUT CONTRAST TECHNIQUE: Multidetector CT imaging of the abdomen and pelvis was performed following the standard protocol without IV contrast. RADIATION DOSE REDUCTION: This exam was performed according to the departmental dose-optimization program which includes automated exposure control, adjustment of the mA and/or kV according to patient size and/or use of iterative reconstruction technique. COMPARISON:  None Available. FINDINGS: Lower chest: Small bilateral pleural effusions. Mild patchy/ground-glass opacity in the right lower lobe, favoring pneumonia. Bilateral breast augmentation. Hepatobiliary: Unenhanced liver is unremarkable. Suspected tiny layering gallstones (series 3/image 33), without gallbladder wall thickening or distension, but with pericholecystic fluid. No intrahepatic or extrahepatic duct dilatation. Pancreas: Within normal  limits. Spleen: Within normal limits. Adrenals/Urinary Tract: Adrenal glands are within normal limits. Kidneys are grossly unremarkable, noting mild perinephric fluid along the right upper kidney. No hydronephrosis. Bladder is within normal limits. Stomach/Bowel: Stomach is notable for a tiny hiatal hernia. No evidence of bowel obstruction. Normal  appendix (series 3/image 69). No colonic wall thickening or inflammatory changes. Vascular/Lymphatic: No evidence of abdominal aortic aneurysm. Atherosclerotic calcifications of the abdominal aorta and branch vessels. No suspicious abdominopelvic lymphadenopathy. Reproductive: Uterus and right ovary are within normal limits. Multiple left ovarian cysts/follicles measuring up to 4.9 cm (series 3/image 31), measuring simple fluid density, benign. No dedicated follow-up is recommended. Other: Trace pelvic ascites. Musculoskeletal: Visualized osseous structures are within normal limits. IMPRESSION: Suspected cholelithiasis with pericholecystic fluid, but without additional findings to suggest acute cholecystitis on CT. This appearance could also be secondary to a right upper quadrant inflammatory process such as hepatitis. Correlate with laboratory evaluation and consider right upper quadrant ultrasound as clinically warranted. Small bilateral pleural effusions. Mild patchy/ground-glass opacity in the right lower lobe, favoring pneumonia. Additional ancillary findings as above. Electronically Signed   By: Julian Hy M.D.   On: 01/21/2022 20:05     PREVIOUS ENDOSCOPIES:            none   Impression / Plan:   46 year old female admitted with the following:  Refractory nausea and vomiting Aspiration pneumonia causing sepsis Illicit drug use History of hepatitis C  As above, patient with recurrent fentanyl use, came in with aspiration pneumonia meeting criteria for sepsis in the setting of refractory nausea and vomiting.  This has been reportedly ongoing a  few weeks in her chart, however she states this is more just acute in recent days.   She appears very uncomfortable in her bed on rounds today. She has not done well with trial of Zofran, Reglan, Compazine thus far.  Her liver enzymes and lipase are normal.  CT scan led to right upper quadrant ultrasound.  There are no gallstones or sludge in the gallbladder on ultrasound however her gallbladder does appear inflamed.  She does have some upper abdominal tenderness on exam.  With normal liver enzymes cholecystitis seems less likely however she is getting HIDA scan today to rule that out.  If the HIDA is normal, we can consider an upper endoscopy to clear her upper tract although her CT scan did not show any concerning process there.  Perhaps symptoms otherwise reactive to her recent drug use / aspiration pneumonia.  I would try modifying her antiemetics at this point and add Phenergan if she has not tried that yet in place of Compazine.  QTc on EKG is normal.  If HIDA scan is negative, her symptoms persist despite trial of variety of antiemetics, then can consider an EGD although this may not reveal a clear source.  She does have some occasional NSAIDs use.  On PPI currently.  I tentatively saved her spot for EGD tomorrow morning if she continues to feel poorly. Otherwise given her recurrent drug use, would rescreen for HCV with that HCVRNA as her antibody will always be positive.   PLAN: - stop compazine, add scheduled Phenergan - continue empiric protonix - await HIDA scan - tentatively scheduled for EGD tomorrow AM to save a spot for her in case she needs it, can cancel if she feels improved - HCV RNA  Will follow, Dr. Bryan Lemma to assume inpatient GI service tomorrow.  Jolly Mango, MD [facility]

## 2022-01-22 NOTE — Progress Notes (Signed)
[facility]                                                                              Monica Eaton, is a 46 y.o. female, DOB - [birth year], [place] date - 01/19/2022    Outpatient Primary MD for the patient is Patient, No Pcp Per  LOS - 3  days  Chief Complaint  Patient presents with   Fever   Tachycardia   Addiction Problem       Brief summary   Patient is a 47 year old female with history of IV drug use, MSSA cervical discitis/osteomyelitis with MSSA bacteremia on chronic prophylactic Bactrim DS, chronic hep C status post treatment presented with new onset of nausea vomiting, fevers and cough.  Patient reported 28-day stay in jail, just got home, was supposed to go to a detox facility on 12/14 to complete her sentence.   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  She reports that she did not receive any Bactrim treatment in the last 4 weeks she was not present.  The night before the admission, she restarted using illicit drugs, reported sniffing 1 bag of " cotton", then she became very nauseous and vomited several times.  Per ED note, she had reported that she saw some fentanyl residue in the car and had consumed it.  Overnight, she became short of breath with new onset of cough and continued to vomit several times throughout the night.  She also reported chills and subjective fevers, no urinary symptoms or diarrhea.  Patient's family reported that she became suddenly became agitated with confusion.  In ED, temp 101.9 F, tachycardia heart rate up to 140s, tachypnea RR 24 and borderline hypoxia, O2 sats dropped to 88% on talking.  BP initially 90/72, improved to 124/74 Lactic acid 1.4, creatinine 0.9, WBCs 15.4, chest x-ray showed right middle lobe infiltrate suspicious for pneumonia.   Assessment & Plan    Principal Problem: Severe  sepsis ([place]) with aspiration pneumonia, POA -Patient met severe sepsis criteria on admission with hypotension, tachycardia, fevers, leukocytosis, tachypnea, endorgan damage of acute encephalopathy, source likely due to aspiration pneumonia -Admitting physician, Dr. Roosevelt Locks discussed with ID, Dr. Linus Salmons as patient has several allergies including penicillin and cephalosporin.  ID recommended to restart Bactrim DS and add Flagyl to cover aspiration pneumonia -Repeat chest x-ray in 4 weeks to ensure resolution of PNA -Continue IV fluid hydration   Active Problems:  Persistent nausea and vomiting -Unclear etiology, abdominal x-ray negative -Lipase 26, continue scheduled IV Reglan, IV compazine prn  -CT abdomen showed cholelithiasis with pericholecystic fluid but no acute cholecystitis, ?  Hepatitis. -Follow acute hepatitis panel. LFTs normal, no leukocytosis.   - RUQ US showed mild diffuse gallbladder wall thickening and pericholecystic fluid, no sludge or stones, recommended HIDA -Discussed with general surgery, recommended HIDA scan and call back if positive -Discussed in detail with patient's father, who reported that the nausea and vomiting has been ongoing before she was sent to prison 4 weeks ago and she has lost significant weight since then.  -Consulted GI,  may need endoscopy, continue fluids, clear liquids, IV PPI    IVDU (intravenous drug user) -Counseled strongly on cessation, patient will resume detox once she is stable for discharge.  Hypokalemia, hypomagnesemia -Resolved  History of epidural abscess, osteomyelitis on cervical spine, MSSA bacteremia -No acute concerns, resumed Bactrim DS prophylaxis   Dehydration -Secondary to nausea and vomiting, placed on IV fluid hydration  Acute metabolic encephalopathy -Likely due to drug use, aspiration pneumonia and dehydration -Currently alert and oriented, at baseline  Obesity Estimated body mass index is 23.45 kg/m as calculated  from the following:   Height as of this encounter: _0  (1.702 m).   Weight as of this encounter: 67.9 kg.  Code Status: Full CODE STATUS DVT Prophylaxis:  enoxaparin (LOVENOX) injection 40 mg Start: 01/19/22 2200   Level of Care: Level of care: Telemetry Medical Family Communication: Updated patient's father on the phone  Disposition Plan:      Remains inpatient appropriate: Currently not stable for discharge due to intractable nausea and vomiting, dehydration.   Procedures:  None  Consultants:   Infectious disease Gastroenterology  Antimicrobials:   Anti-infectives (From admission, onward)    Start     Dose/Rate Route Frequency Ordered Stop   01/20/22 0100  vancomycin (VANCOREADY) IVPB 750 mg/150 mL  Status:  Discontinued        750 mg 150 mL/hr over 60 Minutes Intravenous Every 12 hours 01/19/22 1212 01/19/22 1427   01/19/22 2200  sulfamethoxazole-trimethoprim (BACTRIM DS) 800-160 MG per tablet 1 tablet        1 tablet Oral Every 12 hours 01/19/22 1428     01/19/22 1430  metroNIDAZOLE (FLAGYL) IVPB 500 mg        500 mg 100 mL/hr over 60 Minutes Intravenous Every 12 hours 01/19/22 1428     01/19/22 1215  vancomycin (VANCOREADY) IVPB 1500 mg/300 mL        1,500 mg 150 mL/hr over 120 Minutes Intravenous  Once 01/19/22 1212 01/19/22 1702   01/19/22 1100  azithromycin (ZITHROMAX) 500 mg in sodium chloride 0.9 % 250 mL IVPB  Status:  Discontinued        500 mg 250 mL/hr over 60 Minutes Intravenous  Once 01/19/22 1056 01/19/22 1427   01/19/22 1100  ceFEPIme (MAXIPIME) 2 g in sodium chloride 0.9 % 100 mL IVPB  Status:  Discontinued        2 g 200 mL/hr over 30 Minutes Intravenous Every 12 hours 01/19/22 1059 01/19/22 1427          Medications  enoxaparin (LOVENOX) injection  40 mg Subcutaneous Q24H   guaiFENesin  1,200 mg Oral BID   LORazepam  1 mg Oral BID   metoCLOPramide (REGLAN) injection  10 mg Intravenous Q6H   neomycin-bacitracin-polymyxin  1 Application  Topical TID   sulfamethoxazole-trimethoprim  1 tablet Oral Q12H      Subjective:   Monica Eaton was seen and examined today.  Persisting nausea and vomiting, unable to tolerate diet.  No fevers, no chest pain or shortness of breath.  Objective:   Vitals:   01/21/22 1119 01/21/22 2135 01/22/22 0502 01/22/22 1048  BP: (!) 145/84 (!) 146/89 (!) 145/89 (!) 161/95  Pulse: 98   (!) 106  Resp: _1 Temp:  98.9 F (37.2 C) 98.9 F (37.2 C) 98.3 F (36.8 C)  TempSrc:  Oral Oral Oral  SpO2: 95% 95% 97% 100%  Weight:   67.9 kg   Height:  Intake/Output Summary (Last 24 hours) at 01/22/2022 1247 Last data filed at 01/22/2022 0847 Gross per 24 hour  Intake 1415.8 ml  Output 1400 ml  Net 15.8 ml     Wt Readings from Last 3 Encounters:  01/22/22 67.9 kg  10/04/21 74.9 kg  08/02/21 76.8 kg    Physical Exam General: Alert and oriented x 3, NAD Cardiovascular: S1 S2 clear, RRR.  Respiratory: Diminished sounds at the bases Gastrointestinal: Soft, mild diffuse TTP , nondistended, NBS Ext: no pedal edema bilaterally Neuro: no new deficits Psych: Normal affect     Data Reviewed:  I have personally reviewed following labs    CBC Lab Results  Component Value Date   WBC 9.3 01/22/2022   RBC 3.28 (L) 01/22/2022   HGB 10.2 (L) 01/22/2022   HCT 30.2 (L) 01/22/2022   MCV 92.1 01/22/2022   MCH 31.1 01/22/2022   PLT 205 01/22/2022   MCHC 33.8 01/22/2022   RDW 12.9 01/22/2022   LYMPHSABS 1.6 01/19/2022   MONOABS 1.2 (H) 01/19/2022   EOSABS 0.0 01/19/2022   BASOSABS 0.0 16/60/6301     Last metabolic panel Lab Results  Component Value Date   NA 140 01/22/2022   K 4.0 01/22/2022   CL 113 (H) 01/22/2022   CO2 14 (L) 01/22/2022   BUN <5 (L) 01/22/2022   CREATININE 0.75 01/22/2022   GLUCOSE 57 (L) 01/22/2022   GFRNONAA >60 01/22/2022   GFRAA >60 09/03/2019   CALCIUM 7.8 (L) 01/22/2022   PHOS 3.6 08/09/2019   PROT 4.9 (L) 01/22/2022   ALBUMIN 2.7  (L) 01/22/2022   BILITOT 0.7 01/22/2022   ALKPHOS 59 01/22/2022   AST 38 01/22/2022   ALT 25 01/22/2022   ANIONGAP 13 01/22/2022    CBG (last 3)  No results for input(s): "GLUCAP" in the last 72 hours.    Coagulation Profile: No results for input(s): "INR", "PROTIME" in the last 168 hours.   Radiology Studies: I have personally reviewed the imaging studies  US Abdomen Limited RUQ (LIVER/GB)  Result Date: 01/22/2022 CLINICAL DATA:  Cholelithiasis EXAM: ULTRASOUND ABDOMEN LIMITED RIGHT UPPER QUADRANT COMPARISON:  CT 01/21/2022, ultrasound 08/28/2019 FINDINGS: Gallbladder: Mild diffuse gallbladder wall thickening. Mild pericholecystic fluid. No sludge or stones are seen within the gallbladder. No sonographic Murphy sign noted by sonographer. Common bile duct: Diameter: 3 mm. Liver: No focal lesion identified. Within normal limits in parenchymal echogenicity. Portal vein is patent on color Doppler imaging with normal direction of blood flow towards the liver. Other: None. IMPRESSION: 1. Mild diffuse gallbladder wall thickening and pericholecystic fluid. No sludge or stones are seen within the gallbladder. Findings are nonspecific and could be due to underlying liver disease. If there is high clinical suspicion for cholecystitis, consider nuclear medicine hepatobiliary scan. 2. Unremarkable sonographic appearance of the liver. Electronically Signed   By: Davina Poke D.O.   On: 01/22/2022 09:55   CT ABDOMEN PELVIS WO CONTRAST  Result Date: 01/21/2022 CLINICAL DATA:  Abdominal pain, nausea/vomiting EXAM: CT ABDOMEN AND PELVIS WITHOUT CONTRAST TECHNIQUE: Multidetector CT imaging of the abdomen and pelvis was performed following the standard protocol without IV contrast. RADIATION DOSE REDUCTION: This exam was performed according to the departmental dose-optimization program which includes automated exposure control, adjustment of the mA and/or kV according to patient size and/or use of  iterative reconstruction technique. COMPARISON:  None Available. FINDINGS: Lower chest: Small bilateral pleural effusions. Mild patchy/ground-glass opacity in the right lower lobe, favoring pneumonia. Bilateral breast augmentation. Hepatobiliary: Unenhanced liver  is unremarkable. Suspected tiny layering gallstones (series 3/image 33), without gallbladder wall thickening or distension, but with pericholecystic fluid. No intrahepatic or extrahepatic duct dilatation. Pancreas: Within normal limits. Spleen: Within normal limits. Adrenals/Urinary Tract: Adrenal glands are within normal limits. Kidneys are grossly unremarkable, noting mild perinephric fluid along the right upper kidney. No hydronephrosis. Bladder is within normal limits. Stomach/Bowel: Stomach is notable for a tiny hiatal hernia. No evidence of bowel obstruction. Normal appendix (series 3/image 69). No colonic wall thickening or inflammatory changes. Vascular/Lymphatic: No evidence of abdominal aortic aneurysm. Atherosclerotic calcifications of the abdominal aorta and branch vessels. No suspicious abdominopelvic lymphadenopathy. Reproductive: Uterus and right ovary are within normal limits. Multiple left ovarian cysts/follicles measuring up to 4.9 cm (series 3/image 31), measuring simple fluid density, benign. No dedicated follow-up is recommended. Other: Trace pelvic ascites. Musculoskeletal: Visualized osseous structures are within normal limits. IMPRESSION: Suspected cholelithiasis with pericholecystic fluid, but without additional findings to suggest acute cholecystitis on CT. This appearance could also be secondary to a right upper quadrant inflammatory process such as hepatitis. Correlate with laboratory evaluation and consider right upper quadrant ultrasound as clinically warranted. Small bilateral pleural effusions. Mild patchy/ground-glass opacity in the right lower lobe, favoring pneumonia. Additional ancillary findings as above. Electronically  Signed   By: Julian Hy M.D.   On: 01/21/2022 20:05       Nikolas Casher M.D. [facility] 01/22/2022, 12:47 PM  Available via Epic secure chat 7am-7pm After 7 pm, please refer to night coverage provider listed on amion.

## 2022-01-22 NOTE — Anesthesia Preprocedure Evaluation (Signed)
Anesthesia Evaluation  Patient identified by MRN, date of birth, ID band Patient awake    Reviewed: Allergy & Precautions, NPO status , Patient's Chart, lab work & pertinent test results  History of Anesthesia Complications Negative for: history of anesthetic complications  Airway Mallampati: III  TM Distance: >3 FB Neck ROM: Limited    Dental  (+) Dental Advisory Given   Pulmonary Current Smoker and Patient abstained from smoking.   Pulmonary exam normal        Cardiovascular negative cardio ROS Normal cardiovascular exam     Neuro/Psych  Neuromuscular disease  negative psych ROS   GI/Hepatic negative GI ROS,,,(+)     substance abuse  alcohol use, methamphetamine use and IV drug use, Hepatitis -, C Denies alcohol x 3 yrs. Former meth use. Last heroin in November    Endo/Other  negative endocrine ROS    Renal/GU negative Renal ROS     Musculoskeletal  (+) Arthritis ,  narcotic dependent  Abdominal   Peds  Hematology  (+) Blood dyscrasia, anemia   Anesthesia Other Findings   Reproductive/Obstetrics                             Anesthesia Physical Anesthesia Plan  ASA: 3  Anesthesia Plan: MAC   Post-op Pain Management: Minimal or no pain anticipated   Induction:   PONV Risk Score and Plan: 1 and Propofol infusion and Treatment may vary due to age or medical condition  Airway Management Planned: Nasal Cannula and Natural Airway  Additional Equipment: None  Intra-op Plan:   Post-operative Plan:   Informed Consent: I have reviewed the patients History and Physical, chart, labs and discussed the procedure including the risks, benefits and alternatives for the proposed anesthesia with the patient or authorized representative who has indicated his/her understanding and acceptance.       Plan Discussed with: CRNA and Anesthesiologist  Anesthesia Plan Comments:         Anesthesia Quick Evaluation

## 2022-01-23 ENCOUNTER — Inpatient Hospital Stay (HOSPITAL_COMMUNITY): Payer: Medicaid Other

## 2022-01-23 ENCOUNTER — Inpatient Hospital Stay (HOSPITAL_COMMUNITY): Payer: Medicaid Other | Admitting: Anesthesiology

## 2022-01-23 ENCOUNTER — Encounter (HOSPITAL_COMMUNITY)
Admission: EM | Disposition: A | Discharge status: Rehabilitation Facility | Payer: Self-pay | Source: Home / Self Care | Attending: Internal Medicine

## 2022-01-23 ENCOUNTER — Encounter (HOSPITAL_COMMUNITY): Payer: Self-pay | Admitting: Internal Medicine

## 2022-01-23 DIAGNOSIS — K269 Duodenal ulcer, unspecified as acute or chronic, without hemorrhage or perforation: Secondary | ICD-10-CM

## 2022-01-23 DIAGNOSIS — D649 Anemia, unspecified: Secondary | ICD-10-CM

## 2022-01-23 DIAGNOSIS — F17210 Nicotine dependence, cigarettes, uncomplicated: Secondary | ICD-10-CM

## 2022-01-23 DIAGNOSIS — M1990 Unspecified osteoarthritis, unspecified site: Secondary | ICD-10-CM

## 2022-01-23 DIAGNOSIS — R112 Nausea with vomiting, unspecified: Secondary | ICD-10-CM

## 2022-01-23 HISTORY — PX: ESOPHAGOGASTRODUODENOSCOPY (EGD) WITH PROPOFOL: SHX5813

## 2022-01-23 HISTORY — PX: BIOPSY: SHX5522

## 2022-01-23 SURGERY — ESOPHAGOGASTRODUODENOSCOPY (EGD) WITH PROPOFOL
Anesthesia: Monitor Anesthesia Care

## 2022-01-23 MED ORDER — SUCRALFATE 1 GM/10ML PO SUSP
1.0000 g | Freq: Three times a day (TID) | ORAL | Status: DC
  Administered 2022-01-23 – 2022-01-24 (×5): 1 g via ORAL
  Filled 2022-01-23 (×7): qty 10

## 2022-01-23 MED ORDER — LIDOCAINE 2% (20 MG/ML) 5 ML SYRINGE
INTRAMUSCULAR | Status: DC | PRN
  Administered 2022-01-23: 20 mg via INTRAVENOUS

## 2022-01-23 MED ORDER — MIDAZOLAM HCL 2 MG/2ML IJ SOLN
INTRAMUSCULAR | Status: DC | PRN
  Administered 2022-01-23: 2 mg via INTRAVENOUS

## 2022-01-23 MED ORDER — PROPOFOL 10 MG/ML IV BOLUS
INTRAVENOUS | Status: DC | PRN
  Administered 2022-01-23: 30 mg via INTRAVENOUS
  Administered 2022-01-23: 20 mg via INTRAVENOUS

## 2022-01-23 MED ORDER — PROPOFOL 500 MG/50ML IV EMUL
INTRAVENOUS | Status: DC | PRN
  Administered 2022-01-23: 150 ug/kg/min via INTRAVENOUS

## 2022-01-23 MED ORDER — TECHNETIUM TC 99M MEBROFENIN IV KIT
5.1000 | PACK | Freq: Once | INTRAVENOUS | Status: AC | PRN
  Administered 2022-01-23: 5.1 via INTRAVENOUS

## 2022-01-23 MED ORDER — PANTOPRAZOLE SODIUM 40 MG PO TBEC
40.0000 mg | DELAYED_RELEASE_TABLET | Freq: Two times a day (BID) | ORAL | Status: DC
  Administered 2022-01-23 – 2022-01-24 (×4): 40 mg via ORAL
  Filled 2022-01-23 (×5): qty 1

## 2022-01-23 SURGICAL SUPPLY — 15 items

## 2022-01-23 NOTE — Op Note (Signed)
[facility] Patient Name: Monica Eaton Procedure Date : 01/23/2022 MRN: [id number] Attending MD: Gerrit Heck , MD, [id number] Date of Birth: 01-06-1976 CSN: [id number] Age: 46 Admit Type: Inpatient Procedure:                Upper GI endoscopy with biopsy Indications:              Epigastric abdominal pain, Abdominal pain in the                            right upper quadrant, Nausea with vomiting Providers:                Gerrit Heck, MD, Doristine Johns, RN, Fransico Setters                            [place], Technician Referring MD:              Medicines:                Monitored Anesthesia Care Complications:            No immediate complications. Estimated Blood Loss:     Estimated blood loss was minimal. Procedure:                Pre-Anesthesia Assessment:                           - Prior to the procedure, a History and Physical                            was performed, and patient medications and                            allergies were reviewed. The patient's tolerance of                            previous anesthesia was also reviewed. The risks                            and benefits of the procedure and the sedation                            options and risks were discussed with the patient.                            All questions were answered, and informed consent                            was obtained. Prior Anticoagulants: The patient has                            taken no anticoagulant or antiplatelet agents. ASA                            Grade Assessment: III - A patient with severe  systemic disease. After reviewing the risks and                            benefits, the patient was deemed in satisfactory                            condition to undergo the procedure.                           After obtaining informed consent, the endoscope was                            passed under direct vision. Throughout the                             procedure, the patient's blood pressure, pulse, and                            oxygen saturations were monitored continuously. The                            [id number]-[id number] ([id number] Olympus endoscope was introduced                            through the mouth, and advanced to the third part                            of duodenum. The upper GI endoscopy was                            accomplished without difficulty. The patient                            tolerated the procedure well. Scope In: Scope Out: Findings:      The examined esophagus was normal.      The gastroesophageal flap valve was visualized endoscopically and       classified as Hill Grade III (minimal fold, loose to endoscope, hiatal       hernia likely).      The entire examined stomach was normal. Biopsies were taken with a cold       forceps for Helicobacter pylori testing. Estimated blood loss was       minimal.      Few non-bleeding superficial duodenal ulcers with no stigmata of       bleeding were found in the duodenal bulb. The largest lesion was 3 mm in       largest dimension. Biopsies were taken with a cold forceps for       histology. Estimated blood loss was minimal.      The second portion of the duodenum and third portion of the duodenum       were normal. Impression:               - Normal esophagus.                           - Gastroesophageal flap valve  classified as Hill                            Grade III (minimal fold, loose to endoscope, hiatal                            hernia likely).                           - Normal stomach. Biopsied.                           - Non-bleeding duodenal ulcers with no stigmata of                            bleeding. Biopsied.                           - Normal second portion of the duodenum and third                            portion of the duodenum. Recommendation:           - Patient has a contact number available for                             emergencies. The signs and symptoms of potential                            delayed complications were discussed with the                            patient. Return to normal activities tomorrow.                            Written discharge instructions were provided to the                            patient.                           - Advance diet as tolerated.                           - Await pathology results.                           - Use Protonix (pantoprazole) 40 mg PO BID for 6                            weeks, then reduce to 40 mg daily x2 weeks, and if                            no further upper GI symptoms, can stop if no  further need for long-term PPI therapy (ie, reflux                            symptoms, etc).                           - Use sucralfate suspension 1 gram PO QID for 4                            weeks.                           - Continue antiemetics as prescribed.                           - Inpatient GI service will sign off at this time.                            Please do not hesitate to contact us with                            additional questions or concerns. Procedure Code(s):        --- Professional ---                           [id number], Esophagogastroduodenoscopy, flexible,                            transoral; with biopsy, single or multiple Diagnosis Code(s):        --- Professional ---                           K26.9, Duodenal ulcer, unspecified as acute or                            chronic, without hemorrhage or perforation                           R10.13, Epigastric pain                           R10.11, Right upper quadrant pain                           R11.2, Nausea with vomiting, unspecified CPT copyright 2022 [employer]. All rights reserved. The codes documented in this report are preliminary and upon coder review may  be revised to meet current compliance requirements. Gerrit Heck, MD 01/23/2022 9:22:00 AM Number of Addenda: 0

## 2022-01-23 NOTE — Progress Notes (Addendum)
Consultation Progress Note   Patient: Monica Eaton ZOX:[id number] DOB: [birth year] DOA: 01/19/2022 DOS: the patient was seen and examined on 01/23/2022 Primary service: DibiaManfred Shirts, MD  Brief hospital course: Patient is a 46 year old female with history of IV drug use, MSSA cervical discitis/osteomyelitis with MSSA bacteremia on chronic prophylactic Bactrim DS, chronic hep C status post treatment presented with new onset of nausea vomiting, fevers and cough.  Patient reported 28-day stay in jail, just got home, was supposed to go to a detox facility on 12/14 to complete her sentence.   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  She reports that she did not receive any Bactrim treatment in the last 4 weeks she was not present.  The night before the admission, she restarted using illicit drugs, reported sniffing 1 bag of " cotton", then she became very nauseous and vomited several times.  Per ED note, she had reported that she saw some fentanyl residue in the car and had consumed it.  Overnight, she became short of breath with new onset of cough and continued to vomit several times throughout the night.  She also reported chills and subjective fevers, no urinary symptoms or diarrhea.  Patient's family reported that she became suddenly became agitated with confusion.   In ED, temp 101.9 F, tachycardia heart rate up to 140s, tachypnea RR 24 and borderline hypoxia, O2 sats dropped to 88% on talking.  BP initially 90/72, improved to 124/74 Lactic acid 1.4, creatinine 0.9, WBCs 15.4, chest x-ray showed right middle lobe infiltrate suspicious for pneumonia.    Assessment and Plan: rincipal Problem: Severe sepsis ([place]) with aspiration pneumonia, POA -Patient met severe sepsis criteria on admission with hypotension, tachycardia, fevers, leukocytosis, tachypnea, endorgan damage of acute  encephalopathy, source likely due to aspiration pneumonia -Admitting physician, Dr. Roosevelt Locks discussed with ID, Dr. Linus Salmons as patient has several allergies including penicillin and cephalosporin.  ID recommended to restart Bactrim DS and add Flagyl to cover aspiration pneumonia -Repeat chest x-ray in 4 weeks to ensure resolution of PNA -Continue IV fluid hydration     Active Problems:   Persistent nausea and vomiting -Unclear etiology, abdominal x-ray negative -Lipase 26, continue scheduled IV Reglan, IV compazine prn  -CT abdomen showed cholelithiasis with pericholecystic fluid but no acute cholecystitis, ?  Hepatitis. -Follow acute hepatitis panel. LFTs normal, no leukocytosis.   - RUQ US showed mild diffuse gallbladder wall thickening and pericholecystic fluid, no sludge or stones, recommended HIDA -Discussed with general surgery, recommended HIDA scan and call back if positive -Discussed in detail with patient's father, who reported that the nausea and vomiting has been ongoing before she was sent to prison 4 weeks ago and she has lost significant weight since then.  -Consulted GI, may need endoscopy, continue fluids, clear liquids, IV PPI  12/18-  The examined esophagus was normal. Findings: The gastroesophageal flap valve was visualized endoscopically and classified as Hill Grade III (minimal fold, loose to endoscope, hiatal hernia likely). The entire examined stomach was normal. Biopsies were taken with a cold forceps for Helicobacter pylori testing. Estimated blood loss was minimal. Few non-bleeding superficial duodenal ulcers with no stigmata of bleeding were found in the duodenal bulb. The largest lesion was 3 mm in largest dimension. Biopsies were taken with a cold forceps for histology. Estimated blood loss was minimal. The second portion of the duodenum and third portion of the duodenum were normal.  IVDU (intravenous drug user) -Counseled strongly on cessation, patient  will resume detox once she is stable for discharge.   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   Weight as of this encounter: 67.9 kg.   Code Status: Full CODE STATUS DVT Prophylaxis:  enoxaparin (LOVENOX) injection 40 mg Start: 01/19/22 2200           TRH will continue to follow the patient.  Subjective: Seen prior to EGD, reports ongoing nausea and vomiting. She reports some abdominal discomfort.  Physical Exam:  General: Alert and oriented x 3, NAD Cardiovascular: S1 S2 clear, RRR.  Respiratory: Diminished sounds at the bases Gastrointestinal: Soft, mild diffuse TTP , nondistended, NBS Ext: no pedal edema bilaterally Neuro: no new deficits Psych: Normal affect    Vitals:   01/23/22 0910 01/23/22 0915 01/23/22 0925 01/23/22 1202  BP: (!) 156/95 (!) 159/102 (!) 164/101 (!) 152/87  Pulse: 89 86 88   Resp: _1 Temp: 97.9 F (36.6 C)  97.9 F (36.6 C) 98 F (36.7 C)  TempSrc:    Oral  SpO2: 100% 99% 100% 100%  Weight:      Height:        Data Reviewed:  There are no new results to review at this time.  Family Communication:   Time spent: 15 minutes.  Author: Cristela Felt, MD 01/23/2022 4:38 PM  For on call review [email or web address].

## 2022-01-23 NOTE — Transfer of Care (Signed)
Immediate Anesthesia Transfer of Care Note  Patient: Monica Eaton  Procedure(s) Performed: ESOPHAGOGASTRODUODENOSCOPY (EGD) WITH PROPOFOL BIOPSY  Patient Location: PACU  Anesthesia Type:MAC  Level of Consciousness: sedated  Airway & Oxygen Therapy: Patient Spontanous Breathing  Post-op Assessment: Post -op Vital signs reviewed and stable  Post vital signs: stable  Last Vitals:  Vitals Value Taken Time  BP 156/95 01/23/22 0911  Temp    Pulse 86 01/23/22 0913  Resp 30 01/23/22 0913  SpO2 100 % 01/23/22 0913  Vitals shown include unvalidated device data.  Last Pain:  Vitals:   01/23/22 0819  TempSrc: Temporal  PainSc: 0-No pain      Patients Stated Pain Goal: 0 (31/49/70 2637)  Complications: No notable events documented.

## 2022-01-23 NOTE — Anesthesia Procedure Notes (Signed)
Procedure Name: MAC Date/Time: 01/23/2022 9:14 AM  Performed by: Lavell Luster, CRNAPre-anesthesia Checklist: Patient identified, Emergency Drugs available, Suction available, Patient being monitored and Timeout performed Oxygen Delivery Method: Nasal cannula Induction Type: IV induction Placement Confirmation: breath sounds checked- equal and bilateral and positive ETCO2 Dental Injury: Teeth and Oropharynx as per pre-operative assessment

## 2022-01-23 NOTE — Anesthesia Postprocedure Evaluation (Signed)
Anesthesia Post Note  Patient: Monica Eaton  Procedure(s) Performed: ESOPHAGOGASTRODUODENOSCOPY (EGD) WITH PROPOFOL BIOPSY     Patient location during evaluation: PACU Anesthesia Type: MAC Level of consciousness: awake and alert Pain management: pain level controlled Vital Signs Assessment: post-procedure vital signs reviewed and stable Respiratory status: spontaneous breathing, nonlabored ventilation and respiratory function stable Cardiovascular status: stable and blood pressure returned to baseline Anesthetic complications: no   No notable events documented.  Last Vitals:  Vitals:   01/23/22 0915 01/23/22 0925  BP: (!) 159/102 (!) 164/101  Pulse: 86 88  Resp: 20 10  Temp:  36.6 C  SpO2: 99% 100%    Last Pain:  Vitals:   01/23/22 0925  TempSrc:   PainSc: 0-No pain                 Audry Pili

## 2022-01-23 NOTE — Interval H&P Note (Signed)
History and Physical Interval Note:  Still with nausea/vomiting, although some improvement with interventions made yesterday.  Upper abdominal pain improving.  HIDA delayed, so will proceed with EGD this morning to evaluate for mucosal/luminal pathology.  01/23/2022 8:40 AM  Monica Eaton  has presented today for surgery, with the diagnosis of refractory nausea and vomiting.  The various methods of treatment have been discussed with the patient and family. After consideration of risks, benefits and other options for treatment, the patient has consented to  Procedure(s): ESOPHAGOGASTRODUODENOSCOPY (EGD) WITH PROPOFOL (N/A) as a surgical intervention.  The patient's history has been reviewed, patient examined, no change in status, stable for surgery.  I have reviewed the patient's chart and labs.  Questions were answered to the patient's satisfaction.     Dominic Pea Ellijah Leffel

## 2022-01-24 LAB — CBC
HCT: 28.8 % — ABNORMAL LOW (ref 36.0–46.0)
Hemoglobin: 10 g/dL — ABNORMAL LOW (ref 12.0–15.0)
MCH: 30.1 pg (ref 26.0–34.0)
MCHC: 34.7 g/dL (ref 30.0–36.0)
MCV: 86.7 fL (ref 80.0–100.0)
Platelets: 208 10*3/uL (ref 150–400)
RBC: 3.32 MIL/uL — ABNORMAL LOW (ref 3.87–5.11)
RDW: 12.8 % (ref 11.5–15.5)
WBC: 5.4 10*3/uL (ref 4.0–10.5)
nRBC: 0 % (ref 0.0–0.2)

## 2022-01-24 LAB — COMPREHENSIVE METABOLIC PANEL
ALT: 37 U/L (ref 0–44)
AST: 34 U/L (ref 15–41)
Albumin: 2.6 g/dL — ABNORMAL LOW (ref 3.5–5.0)
Alkaline Phosphatase: 59 U/L (ref 38–126)
Anion gap: 6 (ref 5–15)
BUN: <5 mg/dL — ABNORMAL LOW (ref 6–20)
CO2: 20 mmol/L — ABNORMAL LOW (ref 22–32)
Calcium: 7.9 mg/dL — ABNORMAL LOW (ref 8.9–10.3)
Chloride: 113 mmol/L — ABNORMAL HIGH (ref 98–111)
Creatinine, Ser: 0.76 mg/dL (ref 0.44–1.00)
GFR, Estimated: >60 mL/min (ref >60)
Glucose, Bld: 160 mg/dL — ABNORMAL HIGH (ref 70–99)
Potassium: 2.9 mmol/L — ABNORMAL LOW (ref 3.5–5.1)
Sodium: 139 mmol/L (ref 135–145)
Total Bilirubin: 0.7 mg/dL (ref 0.3–1.2)
Total Protein: 4.8 g/dL — ABNORMAL LOW (ref 6.5–8.1)

## 2022-01-24 LAB — CULTURE, BLOOD (ROUTINE X 2)
Culture: NO GROWTH
Culture: NO GROWTH

## 2022-01-24 MED ORDER — POTASSIUM CHLORIDE 10 MEQ/100ML IV SOLN
10.0000 meq | INTRAVENOUS | Status: AC
  Administered 2022-01-24 (×4): 10 meq via INTRAVENOUS
  Filled 2022-01-24 (×4): qty 100

## 2022-01-24 NOTE — TOC Progression Note (Signed)
Transition of Care [facility]) - Progression Note    Patient Details  Name: Monica Eaton MRN: [id number] Date of Birth: March 28, 1975  [facility]) CM/SW Contact  Zenon Mayo, RN Phone Number: 01/24/2022, 5:04 PM  Clinical Narrative:    from home, sepsis, conts on iv abx, getting 4 runs if iv k. TOC following.        Expected Discharge Plan and Services                                                 Social Determinants of Health (SDOH) Interventions    Readmission Risk Interventions     No data to display

## 2022-01-24 NOTE — Plan of Care (Signed)
  Problem: Education: Goal: Knowledge of General Education information will improve Description Including pain rating scale, medication(s)/side effects and non-pharmacologic comfort measures Outcome: Progressing   Problem: Health Behavior/Discharge Planning: Goal: Ability to manage health-related needs will improve Outcome: Progressing   

## 2022-01-24 NOTE — Progress Notes (Signed)
Consultation Progress Note   Patient: Monica Eaton [id number]:[id number] DOB: [birth year] DOA: 01/19/2022 DOS: the patient was seen and examined on 01/24/2022 Primary service: DibiaManfred Shirts, MD  Brief hospital course:  46 year old female with history of IV drug use, MSSA cervical discitis/osteomyelitis with MSSA bacteremia on chronic prophylactic Bactrim DS, chronic hep C status post treatment presented with new onset of nausea vomiting, fevers and cough.  Patient reported 28-day stay in jail, just got home, was supposed to go to a detox facility on 12/14 to complete her sentence.   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  She reports that she did not receive any Bactrim treatment in the last 4 weeks she was not present.  The night before the admission, she restarted using illicit drugs, reported sniffing 1 bag of " cotton", then she became very nauseous and vomited several times.  Per ED note, she had reported that she saw some fentanyl residue in the car and had consumed it.  Overnight, she became short of breath with new onset of cough and continued to vomit several times throughout the night.  She also reported chills and subjective fevers, no urinary symptoms or diarrhea.  Patient's family reported that she became suddenly became agitated with confusion.   In ED, temp 101.9 F, tachycardia heart rate up to 140s, tachypnea RR 24 and borderline hypoxia, O2 sats dropped to 88% on talking.  BP initially 90/72, improved to 124/74 Lactic acid 1.4, creatinine 0.9, WBCs 15.4, chest x-ray showed right middle lobe infiltrate suspicious for pneumonia.  Assessment and Plan: Severe sepsis ([place]) with aspiration pneumonia, POA -Patient met severe sepsis criteria on admission with hypotension, tachycardia, fevers, leukocytosis, tachypnea, endorgan damage of acute encephalopathy, source likely due to  aspiration pneumonia -Admitting physician, Dr. Roosevelt Locks discussed with ID, Dr. Linus Salmons as patient has several allergies including penicillin and cephalosporin.  ID recommended to restart Bactrim DS and add Flagyl to cover aspiration pneumonia -Repeat chest x-ray in 4 weeks to ensure resolution of PNA -Continue IV fluid hydration     Active Problems:   Persistent nausea and vomiting -Unclear etiology, abdominal x-ray negative -Lipase 26, continue scheduled IV Reglan, IV compazine prn  -CT abdomen showed cholelithiasis with pericholecystic fluid but no acute cholecystitis, ?  Hepatitis. -Follow acute hepatitis panel. LFTs normal, no leukocytosis.   - RUQ US showed mild diffuse gallbladder wall thickening and pericholecystic fluid, no sludge or stones, recommended HIDA -Discussed with general surgery, recommended HIDA scan and call back if positive -Discussed in detail with patient's father, who reported that the nausea and vomiting has been ongoing before she was sent to prison 4 weeks ago and she has lost significant weight since then.  -Consulted GI, may need endoscopy, continue fluids, clear liquids, IV PPI   12/18-   The examined esophagus was normal. Findings: The gastroesophageal flap valve was visualized endoscopically and classified as Hill Grade III (minimal fold, loose to endoscope, hiatal hernia likely). The entire examined stomach was normal. Biopsies were taken with a cold forceps for Helicobacter pylori testing. Estimated blood loss was minimal. Few non-bleeding superficial duodenal ulcers with no stigmata of bleeding were found in the duodenal bulb. The largest lesion was 3 mm in largest dimension. Biopsies were taken with a cold forceps for histology. Estimated blood loss was minimal. The second portion of the duodenum and third portion of the duodenum were normal.  12/19 -Upgraded diet to Soft  diet, Denies n/v for past 24hrs     IVDU (intravenous drug user) -Counseled  strongly on cessation, patient will resume detox once she is stable for discharge.   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   Weight as of this encounter: 67.9 kg.   Code Status: Full CODE STATUS DVT Prophylaxis:  enoxaparin (LOVENOX) injection 40 mg Start: 01/19/22 2200         TRH will continue to follow the patient.  Subjective: Feels better, no more vomiting since 3am on Monday. Wants to eat substantial meal  Physical Exam: Vitals:   01/23/22 0925 01/23/22 1202 01/23/22 2006 01/24/22 0150  BP: (!) 164/101 (!) 152/87 (!) 159/93 (!) 164/89  Pulse: 88  79 86  Resp: 10 18    Temp: 97.9 F (36.6 C) 98 F (36.7 C) 98.3 F (36.8 C) 98.2 F (36.8 C)  TempSrc:  Oral Oral Oral  SpO2: 100% 100%    Weight:    64.5 kg  Height:      General: Alert and oriented x 3, NAD Cardiovascular: S1 S2 clear, RRR.  Respiratory: Diminished sounds at the bases Gastrointestinal: Soft,Non tender, nondistended, NBS Ext: no pedal edema bilaterally Neuro: no new deficits Psych: Normal affect   Data Reviewed:  There are no new results to review at this time.  Family Communication: Father   Time spent: 15 minutes.  Author: Cristela Felt, MD 01/24/2022 11:35 AM  For on call review [email or web address].

## 2022-01-24 NOTE — Progress Notes (Signed)
   01/24/22 1000  Mobility  Activity Ambulated independently in hallway  Level of Assistance Independent  Assistive Device None  Distance Ambulated (ft) 400 ft  Activity Response Tolerated well  Mobility Referral Yes  $Mobility charge 1 Mobility   Mobility Specialist Progress Note  Received pt in bed having no complaints and agreeable to mobility. Pt was asymptomatic throughout ambulation and returned to room w/o fault. Left in chair w/ call bell in reach and all needs met.   Lucious Groves Mobility Specialist  Please contact via SecureChat or Rehab office at [phone]

## 2022-01-25 LAB — SURGICAL PATHOLOGY

## 2022-01-25 LAB — BASIC METABOLIC PANEL
Anion gap: 5 (ref 5–15)
BUN: <5 mg/dL — ABNORMAL LOW (ref 6–20)
CO2: 23 mmol/L (ref 22–32)
Calcium: 8 mg/dL — ABNORMAL LOW (ref 8.9–10.3)
Chloride: 114 mmol/L — ABNORMAL HIGH (ref 98–111)
Creatinine, Ser: 0.67 mg/dL (ref 0.44–1.00)
GFR, Estimated: >60 mL/min (ref >60)
Glucose, Bld: 98 mg/dL (ref 70–99)
Potassium: 3.2 mmol/L — ABNORMAL LOW (ref 3.5–5.1)
Sodium: 142 mmol/L (ref 135–145)

## 2022-01-25 LAB — CBC
HCT: 31.9 % — ABNORMAL LOW (ref 36.0–46.0)
Hemoglobin: 11 g/dL — ABNORMAL LOW (ref 12.0–15.0)
MCH: 29.9 pg (ref 26.0–34.0)
MCHC: 34.5 g/dL (ref 30.0–36.0)
MCV: 86.7 fL (ref 80.0–100.0)
Platelets: 144 10*3/uL — ABNORMAL LOW (ref 150–400)
RBC: 3.68 MIL/uL — ABNORMAL LOW (ref 3.87–5.11)
RDW: 13.1 % (ref 11.5–15.5)
WBC: 5.3 10*3/uL (ref 4.0–10.5)
nRBC: 0 % (ref 0.0–0.2)

## 2022-01-25 MED ORDER — SULFAMETHOXAZOLE-TRIMETHOPRIM 800-160 MG PO TABS: 1.0000 | ORAL_TABLET | Freq: Two times a day (BID) | ORAL | Status: AC

## 2022-01-25 MED ORDER — PANTOPRAZOLE SODIUM 40 MG PO TBEC: 40.0000 mg | DELAYED_RELEASE_TABLET | Freq: Two times a day (BID) | ORAL | 0 refills | Status: AC

## 2022-01-25 MED ORDER — METRONIDAZOLE 500 MG PO TABS: 500.0000 mg | ORAL_TABLET | Freq: Three times a day (TID) | ORAL | 0 refills | Status: DC

## 2022-01-25 NOTE — Progress Notes (Signed)
   01/25/22 1000  Mobility  Activity Ambulated independently in hallway  Level of Assistance Independent  Assistive Device None  Distance Ambulated (ft) 400 ft  Activity Response Tolerated well  Mobility Referral Yes  $Mobility charge 1 Mobility   Mobility Specialist Progress Note  Received pt in bed having no complaints and agreeable to mobility. Pt was asymptomatic throughout ambulation and returned to room w/o fault. Left in bed w/ call bell in reach and all needs met.   Lucious Groves Mobility Specialist  Please contact via SecureChat or Rehab office at [phone]

## 2022-01-25 NOTE — TOC Transition Note (Signed)
Transition of Care [facility]) - CM/SW Discharge Note   Patient Details  Name: Monica Eaton MRN: [id number] Date of Birth: April 06, 1975  [facility]) CM/SW Contact:  Zenon Mayo, RN Phone Number: 01/25/2022, 1:58 PM   Clinical Narrative:    Patient is for dc today, she has transportation at dc. She states her family member will be taking her to a drug rehab facility, that she arranged.         Patient Goals and CMS Choice          Discharge Placement                       Discharge Plan and Services                                     Social Determinants of Health (SDOH) Interventions     Readmission Risk Interventions     No data to display

## 2022-01-25 NOTE — Progress Notes (Signed)
Patient discharged: with family to drug rehab facility  Via: Wheelchair   Discharge paperwork given: to patient and family  Reviewed with teach back  IV and telemetry disconnected  Belongings given to patient  Father is transporting pt to rehab facility. On the way out gave this RN from for physician to sign statement that patient is capable/ or not to self administer her medication at the facility. Called MD Dibia Vira Agar, per MD she is currently not on the campus and cannot sign it now. Advised family to wait for her to sign it. Per father patient needs to be at facility by 5 pm and they cannot wait. Family asked if MD can sign paperwork and fax it to the facility. MD notified, paper/ form left with unit secretary.

## 2022-01-25 NOTE — Progress Notes (Signed)
Rounded on patient to find most 2200 mucinex, protonix, bactrim, and sucralfate on bedside table. Encouraged patient to take medications and retime accordingly. Patient states that she will "take them by morning"; medications confiscated, on call physician informed.

## 2022-01-25 NOTE — Discharge Summary (Signed)
Physician Discharge Summary   Patient: Monica Eaton MRN: [id number] DOB: Apr 02, 1975  Admit date:     01/19/2022  Discharge date: 01/25/22  Discharge Physician: Manfred Shirts Symphonie Schneiderman   PCP: Patient, No Pcp Per   Recommendations at discharge:    Follow up with PCP  Discharge Diagnoses: Principal Problem:   Sepsis ([place]) Active Problems:   IVDU (intravenous drug user)   Epidural abscess   Aspiration pneumonia (HCC)   Refractory nausea and vomiting   Abdominal pain, epigastric   Abnormal finding on GI tract imaging   History of hepatitis C   Duodenal ulcer   Nausea and vomiting  Resolved Problems:   * No resolved hospital problems. *  Hospital Course:  46 year old female with history of IV drug use, MSSA cervical discitis/osteomyelitis with MSSA bacteremia on chronic prophylactic Bactrim DS, chronic hep C status post treatment presented with new onset of nausea vomiting, fevers and cough.  Patient reported 28-day stay in jail, just got home, was supposed to go to a detox facility on 12/14 to complete her sentence.   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  She reports that she did not receive any Bactrim treatment in the last 4 weeks she was not present.  The night before the admission, she restarted using illicit drugs, reported sniffing 1 bag of " cotton", then she became very nauseous and vomited several times.  Per ED note, she had reported that she saw some fentanyl residue in the car and had consumed it.  Overnight, she became short of breath with new onset of cough and continued to vomit several times throughout the night.  She also reported chills and subjective fevers, no urinary symptoms or diarrhea.  Patient's family reported that she became suddenly became agitated with confusion.   In ED, temp 101.9 F, tachycardia heart rate up to 140s, tachypnea RR 24 and  borderline hypoxia, O2 sats dropped to 88% on talking.  BP initially 90/72, improved to 124/74 Lactic acid 1.4, creatinine 0.9, WBCs 15.4, chest x-ray showed right middle lobe infiltrate suspicious for pneumonia.    Assessment and Plan: Severe sepsis ([place]) with aspiration pneumonia, POA -Patient met severe sepsis criteria on admission with hypotension, tachycardia, fevers, leukocytosis, tachypnea, endorgan damage of acute encephalopathy, source likely due to aspiration pneumonia -Admitting physician, Dr. Roosevelt Locks discussed with ID, Dr. Linus Salmons as patient has several allergies including penicillin and cephalosporin.  ID recommended to restart Bactrim DS and add Flagyl to cover aspiration pneumonia -Repeat chest x-ray in 4 weeks to ensure resolution of PNA -Continue IV fluid hydration     Active Problems:   Persistent nausea and vomiting -Unclear etiology, abdominal x-ray negative -Lipase 26, continue scheduled IV Reglan, IV compazine prn  -CT abdomen showed cholelithiasis with pericholecystic fluid but no acute cholecystitis, ?  Hepatitis. -Follow acute hepatitis panel. LFTs normal, no leukocytosis.   - RUQ US showed mild diffuse gallbladder wall thickening and pericholecystic fluid, no sludge or stones, recommended HIDA -Discussed with general surgery, recommended HIDA scan and call back if positive -Discussed in detail with patient's father, who reported that the nausea and vomiting has been ongoing before she was sent to prison 4 weeks ago and she has lost significant weight since then.  -Consulted GI, may need endoscopy, continue fluids, clear liquids, IV PPI   12/18-   The examined esophagus was normal. Findings: The gastroesophageal flap valve was visualized endoscopically and classified  as Hill Grade III (minimal fold, loose to endoscope, hiatal hernia likely). The entire examined stomach was normal. Biopsies were taken with a cold forceps for Helicobacter pylori testing. Estimated  blood loss was minimal. Few non-bleeding superficial duodenal ulcers with no stigmata of bleeding were found in the duodenal bulb. The largest lesion was 3 mm in largest dimension. Biopsies were taken with a cold forceps for histology. Estimated blood loss was minimal. The second portion of the duodenum and third portion of the duodenum were normal.   12/19 -Upgraded diet to Soft diet, Denies n/v for past 24hrs     IVDU (intravenous drug user) -Counseled strongly on cessation, patient will resume detox once she is stable for discharge.   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   Weight as of this encounter: 67.9 kg.   Code Status: Full CODE STATUS DVT Prophylaxis:  enoxaparin (LOVENOX) injection 40 mg Start: 01/19/22 2200                      Consultants: gi Procedures performed: EGD  Disposition: Home Diet recommendation:  Regular diet DISCHARGE MEDICATION: Allergies as of 01/25/2022       Reactions   Gabapentin Other (See Comments)   Causes extreme agitation.    Cefaclor    Cephalosporins    Nitrofurantoin    Penicillins    Spoke with patient's family. They report that Monica Eaton had a rash/hives when she was 8-95 years old. She did not have any shortness of breath and they do not recall having to take her to the doctor's office or hospital to treat the reaction.         Medication List     TAKE these medications    ibuprofen 600 MG tablet Commonly known  as: ADVIL Take 1 tablet (600 mg total) by mouth 3 (three) times daily as needed.   metroNIDAZOLE 500 MG tablet Commonly known as: Flagyl Take 1 tablet (500 mg total) by mouth 3 (three) times daily for 7 days.   pantoprazole 40 MG tablet Commonly known as: PROTONIX Take 1 tablet (40 mg total) by mouth 2 (two) times daily.   sulfamethoxazole-trimethoprim 800-160 MG tablet Commonly known as: BACTRIM DS Take 1 tablet by mouth 2 (two) times daily. What changed: Another medication with the same name was added. Make sure you understand how and when to take each.   sulfamethoxazole-trimethoprim 800-160 MG tablet Commonly known as: BACTRIM DS Take 1 tablet by mouth every 12 (twelve) hours. What changed: You were already taking a medication with the same name, and this prescription was added. Make sure you understand how and when to take each.        Discharge Exam: Filed Weights   01/23/22 0819 01/24/22 0150 01/25/22 0443  Weight: 68 kg 64.5 kg 63.5 kg  General: Alert and oriented x 3, NAD Cardiovascular: S1 S2 clear, RRR.  Respiratory: Diminished sounds at the bases Gastrointestinal: Soft,Non tender, nondistended, NBS Ext: no pedal edema bilaterally Neuro: no new deficits Psych: Normal  affect      Condition at discharge: good  The results of significant diagnostics from this hospitalization (including imaging, microbiology, ancillary and laboratory) are listed below for reference.   Imaging Studies: NM Hepatobiliary Liver Func  Result Date: 01/23/2022 CLINICAL DATA:  Cholelithiasis EXAM: NUCLEAR MEDICINE HEPATOBILIARY IMAGING TECHNIQUE: Sequential images of the abdomen were obtained out to 60 minutes following intravenous administration of radiopharmaceutical. Patient refused to ingest Ensure as such no subsequent scintigraphic imaging was acquired to allow for the calculation of gallbladder ejection fraction. RADIOPHARMACEUTICALS:  5.1 mCi Tc-31m Choletec IV COMPARISON:   Ultrasound January 22, 2022 and CT January 21, 2022. FINDINGS: Prompt uptake and biliary excretion of activity by the liver is seen. Gallbladder activity is visualized, consistent with patency of cystic duct. Biliary activity is visualized in small bowel compatible with common bile duct patency. IMPRESSION: No scintigraphic evidence of acute cholecystitis. Electronically Signed   By: JDahlia BailiffM.D.   On: 01/23/2022 15:48   UKoreaAbdomen Limited RUQ (LIVER/GB)  Result Date: 01/22/2022 CLINICAL DATA:  Cholelithiasis EXAM: ULTRASOUND ABDOMEN LIMITED RIGHT UPPER QUADRANT COMPARISON:  CT 01/21/2022, ultrasound 08/28/2019 FINDINGS: Gallbladder: Mild diffuse gallbladder wall thickening. Mild pericholecystic fluid. No sludge or stones are seen within the gallbladder. No sonographic Murphy sign noted by sonographer. Common bile duct: Diameter: 3 mm. Liver: No focal lesion identified. Within normal limits in parenchymal echogenicity. Portal vein is patent on color Doppler imaging with normal direction of blood flow towards the liver. Other: None. IMPRESSION: 1. Mild diffuse gallbladder wall thickening and pericholecystic fluid. No sludge or stones are seen within the gallbladder. Findings are nonspecific and could be due to underlying liver disease. If there is high clinical suspicion for cholecystitis, consider nuclear medicine hepatobiliary scan. 2. Unremarkable sonographic appearance of the liver. Electronically Signed   By: NDavina PokeD.O.   On: 01/22/2022 09:55   CT ABDOMEN PELVIS WO CONTRAST  Result Date: 01/21/2022 CLINICAL DATA:  Abdominal pain, nausea/vomiting EXAM: CT ABDOMEN AND PELVIS WITHOUT CONTRAST TECHNIQUE: Multidetector CT imaging of the abdomen and pelvis was performed following the standard protocol without IV contrast. RADIATION DOSE REDUCTION: This exam was performed according to the departmental dose-optimization program which includes automated exposure control, adjustment of the mA  and/or kV according to patient size and/or use of iterative reconstruction technique. COMPARISON:  None Available. FINDINGS: Lower chest: Small bilateral pleural effusions. Mild patchy/ground-glass opacity in the right lower lobe, favoring pneumonia. Bilateral breast augmentation. Hepatobiliary: Unenhanced liver is unremarkable. Suspected tiny layering gallstones (series 3/image 33), without gallbladder wall thickening or distension, but with pericholecystic fluid. No intrahepatic or extrahepatic duct dilatation. Pancreas: Within normal limits. Spleen: Within normal limits. Adrenals/Urinary Tract: Adrenal glands are within normal limits. Kidneys are grossly unremarkable, noting mild perinephric fluid along the right upper kidney. No hydronephrosis. Bladder is within normal limits. Stomach/Bowel: Stomach is notable for a tiny hiatal hernia. No evidence of bowel obstruction. Normal appendix (series 3/image 69). No colonic wall thickening or inflammatory changes. Vascular/Lymphatic: No evidence of abdominal aortic aneurysm. Atherosclerotic calcifications of the abdominal aorta and branch vessels. No suspicious abdominopelvic lymphadenopathy. Reproductive: Uterus and right ovary are within normal limits. Multiple left ovarian cysts/follicles measuring up to 4.9 cm (series 3/image 31), measuring simple fluid density, benign. No dedicated follow-up is recommended. Other: Trace pelvic ascites. Musculoskeletal: Visualized osseous structures are within normal limits. IMPRESSION: Suspected cholelithiasis with pericholecystic fluid, but without additional findings to suggest acute cholecystitis on CT. This appearance could also be secondary to a right  upper quadrant inflammatory process such as hepatitis. Correlate with laboratory evaluation and consider right upper quadrant ultrasound as clinically warranted. Small bilateral pleural effusions. Mild patchy/ground-glass opacity in the right lower lobe, favoring pneumonia.  Additional ancillary findings as above. Electronically Signed   By: Julian Hy M.D.   On: 01/21/2022 20:05   DG Abd Portable 2V  Result Date: 01/20/2022 CLINICAL DATA:  Nausea EXAM: PORTABLE ABDOMEN - 2 VIEW COMPARISON:  None Available. FINDINGS: The bowel gas pattern is normal. There is no evidence of free air. No radio-opaque calculi or other significant radiographic abnormality is seen. No acute bony findings. IMPRESSION: Negative. Electronically Signed   By: Davina Poke D.O.   On: 01/20/2022 11:04   CT Head Wo Contrast  Result Date: 01/19/2022 CLINICAL DATA:  Head trauma, abnormal mental status (Age 74-64y). Fever and tachycardia. EXAM: CT HEAD WITHOUT CONTRAST TECHNIQUE: Contiguous axial images were obtained from the base of the skull through the vertex without intravenous contrast. RADIATION DOSE REDUCTION: This exam was performed according to the departmental dose-optimization program which includes automated exposure control, adjustment of the mA and/or kV according to patient size and/or use of iterative reconstruction technique. COMPARISON:  None Available. FINDINGS: Brain: There is no evidence of an acute infarct, intracranial hemorrhage, midline shift, or extra-axial fluid collection. A 1.6 cm low-density/possibly cystic focus is noted anteriorly in the right temporal lobe with thinning of the overlying cortex. The brain is unremarkable in appearance elsewhere. The ventricles are normal in size. Vascular: No hyperdense vessel. Skull: No acute fracture or suspicious osseous lesion. Sinuses/Orbits: Visualized paranasal sinuses and mastoid air cells are clear. Unremarkable orbits. Other: None. IMPRESSION: 1. 1.6 cm low-density focus in the anterior right temporal lobe, indeterminate. While this may reflect a dilated perivascular space or cyst, a brain MRI without and with contrast is recommended for further characterization and to exclude a neoplasm. 2. Otherwise unremarkable CT  appearance of the brain. Electronically Signed   By: Logan Bores M.D.   On: 01/19/2022 11:47   DG Chest 1 View  Result Date: 01/19/2022 CLINICAL DATA:  Fever, tachycardia. EXAM: CHEST  1 VIEW COMPARISON:  None Available. FINDINGS: The heart size and mediastinal contours are within normal limits. Right midlung opacity concerning for pneumonia. Partially imaged cervical spine hardware. IMPRESSION: Right midlung opacity concerning for pneumonia. Follow-up examination to resolution is recommended. Electronically Signed   By: Keane Police D.O.   On: 01/19/2022 10:18    Microbiology: Results for orders placed or performed during the hospital encounter of 01/19/22  Urine Culture     Status: Abnormal   Collection Time: 01/19/22  9:33 AM   Specimen: Urine, Clean Catch  Result Value Ref Range Status   Specimen Description URINE, CLEAN CATCH  Final   Special Requests NONE  Final   Culture (A)  Final    <10,000 COLONIES/mL INSIGNIFICANT GROWTH Performed at [facility], [address] [place], [place] [address]    Report Status 01/21/2022 FINAL  Final  Resp panel by RT-PCR (RSV, Flu A&B, Covid) Anterior Nasal Swab     Status: None   Collection Time: 01/19/22  9:56 AM   Specimen: Anterior Nasal Swab  Result Value Ref Range Status   SARS Coronavirus 2 by RT PCR NEGATIVE NEGATIVE Final    Comment: (NOTE) SARS-CoV-2 target nucleic acids are NOT DETECTED.  The SARS-CoV-2 RNA is generally detectable in upper respiratory specimens during the acute phase of infection. The lowest concentration of SARS-CoV-2 viral copies  this assay can detect is 138 copies/mL. A negative result does not preclude SARS-Cov-2 infection and should not be used as the sole basis for treatment or other patient management decisions. A negative result may occur with  improper specimen collection/handling, submission of specimen other than nasopharyngeal swab, presence of viral mutation(s) within the areas targeted by  this assay, and inadequate number of viral copies(<138 copies/mL). A negative result must be combined with clinical observations, patient history, and epidemiological information. The expected result is Negative.  Fact Sheet for Patients:  EntrepreneurPulse.com.au  Fact Sheet for Healthcare Providers:  IncredibleEmployment.be  This test is no t yet approved or cleared by the [place] FDA and  has been authorized for detection and/or diagnosis of SARS-CoV-2 by FDA under an Emergency Use Authorization (EUA). This EUA will remain  in effect (meaning this test can be used) for the duration of the COVID-19 declaration under Section 564(b)(1) of the Act, 21 U.S.C.section 360bbb-3(b)(1), unless the authorization is terminated  or revoked sooner.       Influenza A by PCR NEGATIVE NEGATIVE Final   Influenza B by PCR NEGATIVE NEGATIVE Final    Comment: (NOTE) The Xpert Xpress SARS-CoV-2/FLU/RSV plus assay is intended as an aid in the diagnosis of influenza from Nasopharyngeal swab specimens and should not be used as a sole basis for treatment. Nasal washings and aspirates are unacceptable for Xpert Xpress SARS-CoV-2/FLU/RSV testing.  Fact Sheet for Patients: EntrepreneurPulse.com.au  Fact Sheet for Healthcare Providers: IncredibleEmployment.be  This test is not yet approved or cleared by the [place] FDA and has been authorized for detection and/or diagnosis of SARS-CoV-2 by FDA under an Emergency Use Authorization (EUA). This EUA will remain in effect (meaning this test can be used) for the duration of the COVID-19 declaration under Section 564(b)(1) of the Act, 21 U.S.C. section 360bbb-3(b)(1), unless the authorization is terminated or revoked.     Resp Syncytial Virus by PCR NEGATIVE NEGATIVE Final    Comment: (NOTE) Fact Sheet for Patients: EntrepreneurPulse.com.au  Fact Sheet  for Healthcare Providers: IncredibleEmployment.be  This test is not yet approved or cleared by the [place] FDA and has been authorized for detection and/or diagnosis of SARS-CoV-2 by FDA under an Emergency Use Authorization (EUA). This EUA will remain in effect (meaning this test can be used) for the duration of the COVID-19 declaration under Section 564(b)(1) of the Act, 21 U.S.C. section 360bbb-3(b)(1), unless the authorization is terminated or revoked.  Performed at [facility], [place] [address] [place], [place] [address]   Blood culture (routine x 2)     Status: None   Collection Time: 01/19/22  9:56 AM   Specimen: BLOOD  Result Value Ref Range Status   Specimen Description BLOOD SITE NOT SPECIFIED  Final   Special Requests   Final    BOTTLES DRAWN AEROBIC AND ANAEROBIC Blood Culture results may not be optimal due to an inadequate volume of blood received in culture bottles   Culture   Final    NO GROWTH 5 DAYS Performed at [facility], [place] [address], [place], [place] [address]    Report Status 01/24/2022 FINAL  Final  Blood culture (routine x 2)     Status: None   Collection Time: 01/19/22 10:01 AM   Specimen: BLOOD  Result Value Ref Range Status   Specimen Description BLOOD SITE NOT SPECIFIED  Final   Special Requests   Final    BOTTLES DRAWN AEROBIC AND ANAEROBIC Blood Culture  results may not be optimal due to an inadequate volume of blood received in culture bottles   Culture   Final    NO GROWTH 5 DAYS Performed at [facility], [place] [address] [place], [place] [address]    Report Status 01/24/2022 FINAL  Final  MRSA Next Gen by PCR, Nasal     Status: None   Collection Time: 01/19/22  1:37 PM   Specimen: Nasal Mucosa; Nasal Swab  Result Value Ref Range Status   MRSA by PCR Next Gen NOT DETECTED NOT DETECTED Final    Comment: (NOTE) The GeneXpert MRSA Assay (FDA approved for NASAL specimens only), is one component of a  comprehensive MRSA colonization surveillance program. It is not intended to diagnose MRSA infection nor to guide or monitor treatment for MRSA infections. Test performance is not FDA approved in patients less than 71 years old. Performed at [facility], [place] [address], [place], [place] [address]     Labs: CBC: Recent Labs  Lab 01/19/22 1019 01/19/22 1031 01/20/22 0350 01/20/22 4799 01/21/22 0057 01/22/22 0650 01/24/22 0103 01/25/22 1213  WBC 15.4*  --  10.6*  --  12.0* 9.3 5.4 5.3  NEUTROABS 12.5*  --   --   --   --   --   --   --   HGB 11.2*   < > 7.9* 10.6* 10.1* 10.2* 10.0* 11.0*  HCT 35.0*   < > 24.3* 31.7* 30.8* 30.2* 28.8* 31.9*  MCV 94.3  --  95.3  --  94.8 92.1 86.7 86.7  PLT 195  --  122*  --  166 205 208 144*   < > = values in this interval not displayed.   Basic Metabolic Panel: Recent Labs  Lab 01/19/22 1019 01/19/22 1031 01/20/22 0608 01/21/22 0057 01/22/22 0650 01/24/22 0103 01/25/22 1213  NA 143   < > 140 141 140 139 142  K 3.4*   < > 4.1 3.8 4.0 2.9* 3.2*  CL 109   < > 110 110 113* 113* 114*  CO2 25  --  21* 19* 14* 20* 23  GLUCOSE 73   < > 68* 57* 57* 160* 98  BUN 13   < > 9 8 <5* <5* <5*  CREATININE 0.94   < > 0.89 0.82 0.75 0.76 0.67  CALCIUM 7.9*  --  7.8* 7.7* 7.8* 7.9* 8.0*  MG 1.6*  --  1.8  --   --   --   --    < > = values in this interval not displayed.   Liver Function Tests: Recent Labs  Lab 01/19/22 1019 01/22/22 0650 01/24/22 0103  AST 40 38 34  ALT 22 25 37  ALKPHOS 57 59 59  BILITOT 0.2* 0.7 0.7  PROT 5.3* 4.9* 4.8*  ALBUMIN 3.3* 2.7* 2.6*   CBG: No results for input(s): "GLUCAP" in the last 168 hours.  Discharge time spent: greater than 30 minutes.  Signed: Cristela Felt, MD [facility] 01/25/2022

## 2022-01-26 ENCOUNTER — Encounter (HOSPITAL_COMMUNITY): Payer: Self-pay | Admitting: Gastroenterology

## 2022-01-31 ENCOUNTER — Telehealth: Payer: Self-pay | Admitting: Internal Medicine

## 2022-01-31 MED ORDER — METRONIDAZOLE 500 MG PO TABS: 500.0000 mg | ORAL_TABLET | Freq: Two times a day (BID) | ORAL | 0 refills | Status: AC

## 2022-01-31 MED ORDER — METRONIDAZOLE 500 MG PO TABS: 500.0000 mg | ORAL_TABLET | Freq: Two times a day (BID) | ORAL | 0 refills | Status: DC

## 2022-01-31 NOTE — Telephone Encounter (Signed)
HOSPITAL MEDICINE TELEPHONE ENCOUNTER NOTE  Notified by [facility] program manager that while patient has been involved in their program since 12/21 they noted that the patient has only been taking her Flagyl once daily instead of the instructed 3 times daily.  Discharge summary written on 12/20 reviewed.  Recommendation for Flagyl was provided by infectious disease and therefore I believe it is extremely important that the patient complete this course of therapy, albeit not exactly as instructed.  I have advised that the patient complete her remaining Flagyl tablets (15 tablets).  She may take this medication twice daily until the supply is exhausted.  A paper prescription will be faxed to the facility for simply for clarity of instruction.  Vernelle Emerald MD [facility]

## 2022-01-31 NOTE — Addendum Note (Signed)
Addended by: Vernelle Emerald on: 01/31/2022 04:08 PM   Modules accepted: Orders

## 2022-05-11 DIAGNOSIS — J189 Pneumonia, unspecified organism: Secondary | ICD-10-CM

## 2022-07-09 IMAGING — CR DG TIBIA/FIBULA 2V*L*
2 series · 2 of 2 positions shown · non-contrast
Comparison: None.

CLINICAL DATA: Possible foreign body lower leg

EXAM:
LEFT TIBIA AND FIBULA - 2 VIEW

[x tib-fib ap left]
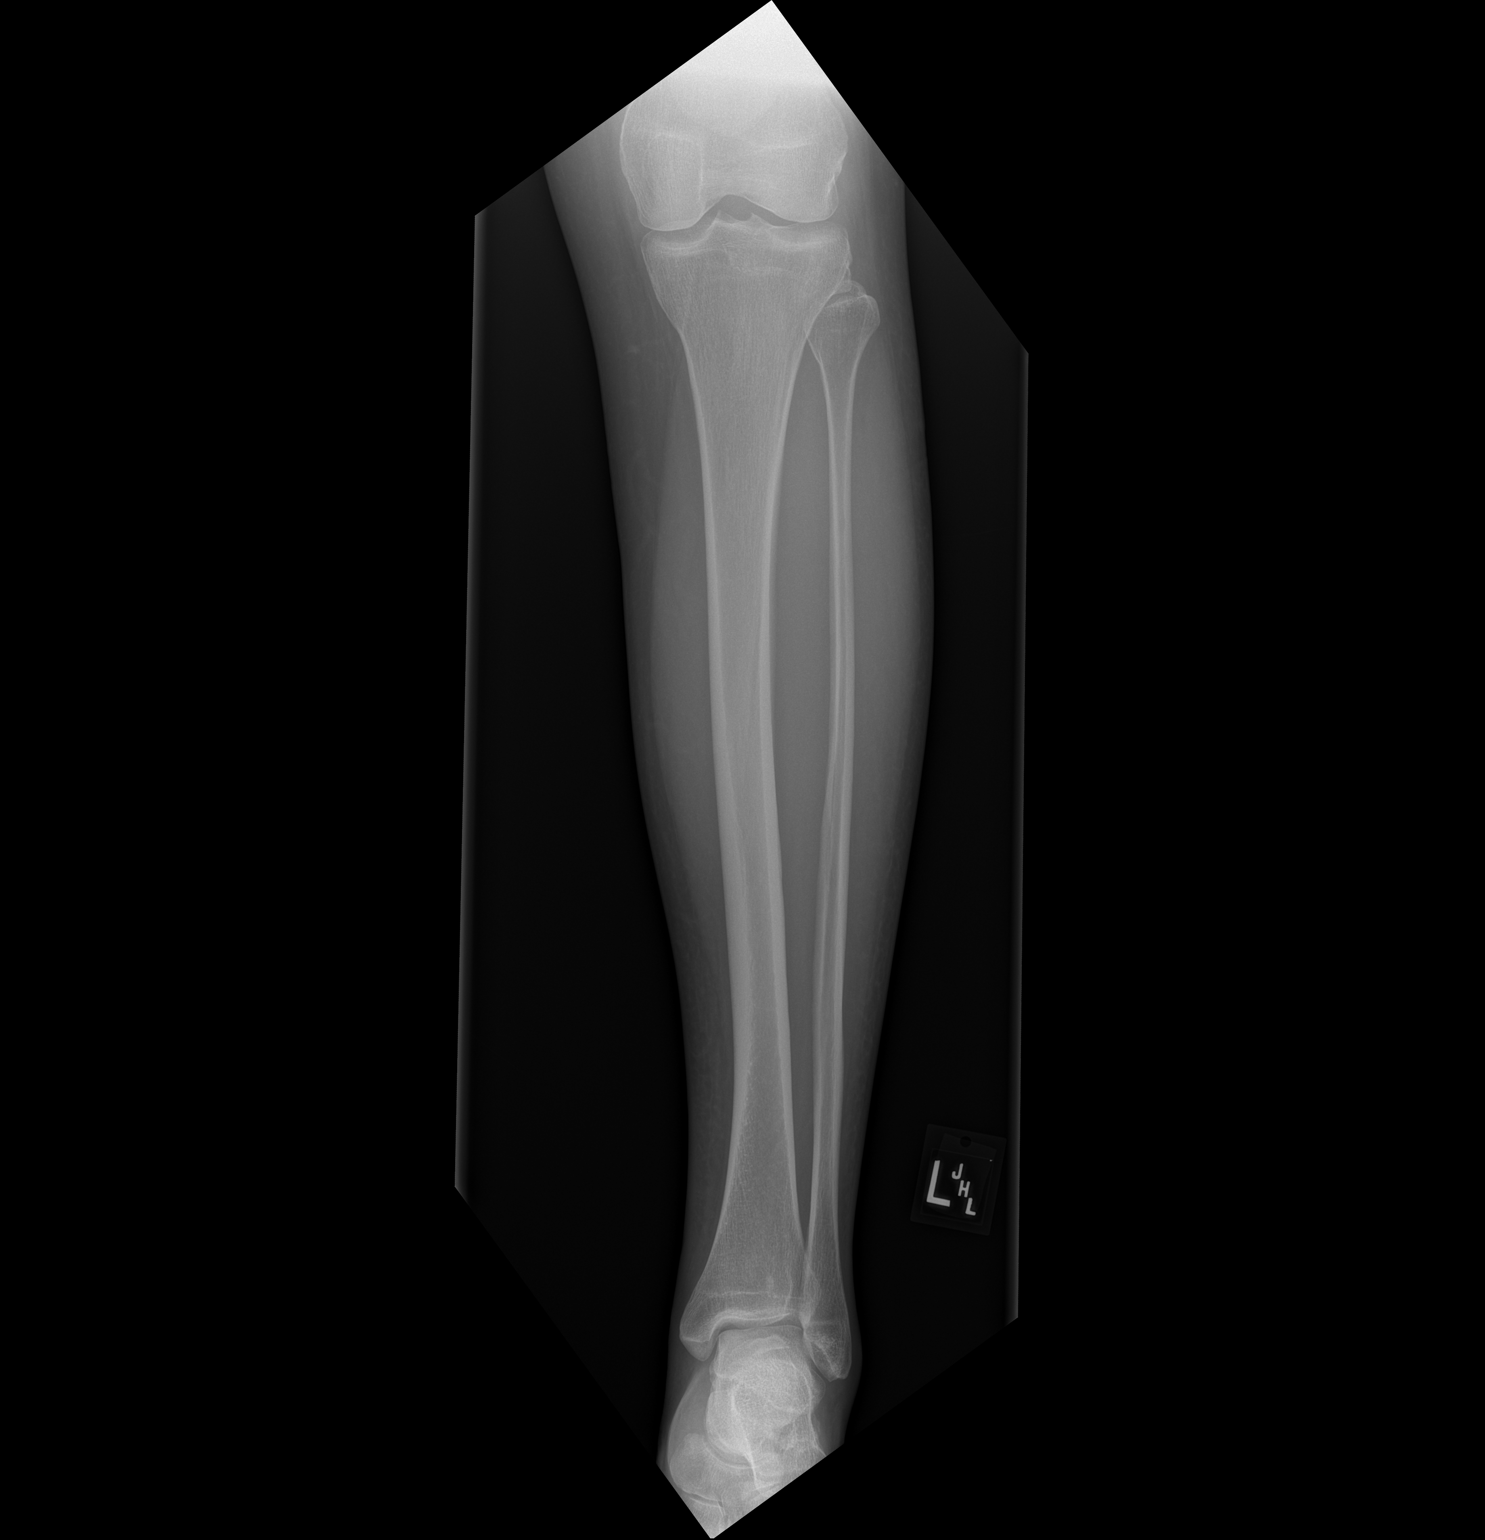

[x tib-fib lat left]
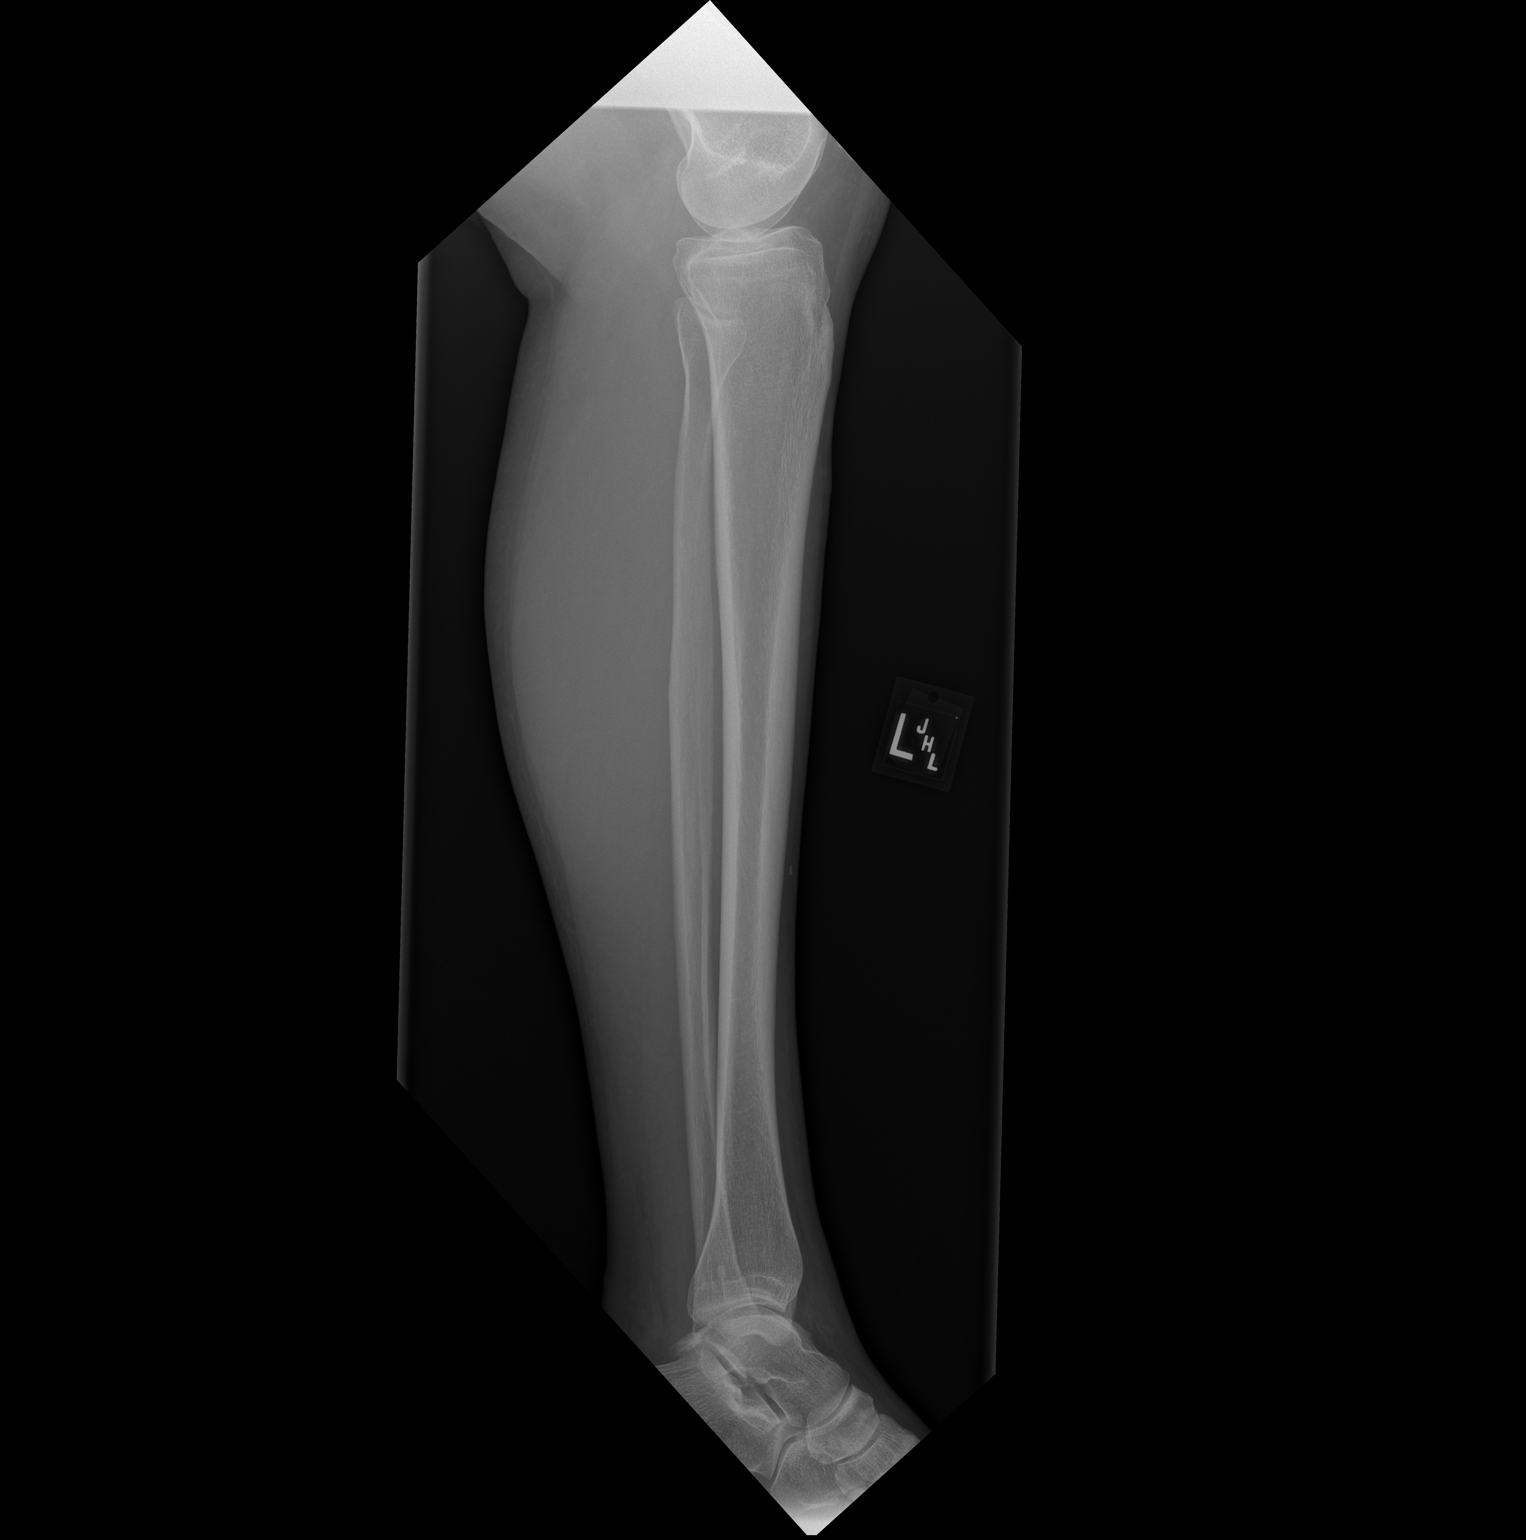

[2 of 2 positions shown; findings below may reference images not displayed]

FINDINGS: There is a 3 mm linear foreign body in the soft tissues anterior to
the mid tibial diaphysis seen in the lateral projection. This is not
seen on the frontal projection and is likely directly anterior to
the shin. The reported area of abnormality is just superior to the
left medial malleolus, so this finding may be artifactual. No
radiopaque foreign body is seen in the reported area of concern.
There is mild edema in the soft tissues superior to the medial
malleolus.

There is no acute osseous abnormality. Knee and ankle alignment
appear maintained.
IMPRESSION: 3 mm linear foreign body in the soft tissues anterior to the mid
tibial diaphysis seen in the lateral projection. This is not seen on
the frontal projection and is likely directly anterior to the shin.
The reported area of abnormality is just superior to the left medial
malleolus, so this finding may be artifactual or reflect vascular
calcification. No radiopaque foreign body is seen in the reported
area of concern, though there is mild soft tissue edema superior to
the medial malleolus.

## 2022-07-10 ENCOUNTER — Ambulatory Visit (HOSPITAL_COMMUNITY)
Admission: EM | Admit: 2022-07-10 | Discharge: 2022-07-10 | Discharge status: Absent without leave | Payer: Medicaid Other

## 2022-07-10 ENCOUNTER — Encounter (HOSPITAL_COMMUNITY): Payer: Self-pay | Admitting: Licensed Clinical Social Worker

## 2022-07-10 ENCOUNTER — Ambulatory Visit (INDEPENDENT_AMBULATORY_CARE_PROVIDER_SITE_OTHER): Payer: Medicaid Other | Admitting: Licensed Clinical Social Worker

## 2022-07-10 DIAGNOSIS — F1191 Opioid use, unspecified, in remission: Secondary | ICD-10-CM | POA: Diagnosis not present
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
## 2022-10-19 ENCOUNTER — Other Ambulatory Visit: Payer: Self-pay

## 2022-10-19 ENCOUNTER — Emergency Department (HOSPITAL_COMMUNITY)
Admission: EM | Admit: 2022-10-19 | Discharge: 2022-10-19 | Discharge status: Against Medical Advice | Payer: MEDICAID | Attending: Emergency Medicine | Admitting: Emergency Medicine

## 2022-10-19 DIAGNOSIS — T50901A Poisoning by unspecified drugs, medicaments and biological substances, accidental (unintentional), initial encounter: Secondary | ICD-10-CM | POA: Diagnosis present

## 2022-10-19 DIAGNOSIS — R7309 Other abnormal glucose: Secondary | ICD-10-CM | POA: Insufficient documentation

## 2022-10-19 DIAGNOSIS — Z5321 Procedure and treatment not carried out due to patient leaving prior to being seen by health care provider: Secondary | ICD-10-CM | POA: Diagnosis not present

## 2022-10-19 DIAGNOSIS — Y900 Blood alcohol level of less than 20 mg/100 ml: Secondary | ICD-10-CM | POA: Diagnosis not present

## 2022-10-19 LAB — COMPREHENSIVE METABOLIC PANEL
ALT: 18 U/L (ref 0–44)
AST: 23 U/L (ref 15–41)
Albumin: 4.2 g/dL (ref 3.5–5.0)
Alkaline Phosphatase: 46 U/L (ref 38–126)
Anion gap: 12 (ref 5–15)
BUN: 7 mg/dL (ref 6–20)
CO2: 21 mmol/L — ABNORMAL LOW (ref 22–32)
Calcium: 8.4 mg/dL — ABNORMAL LOW (ref 8.9–10.3)
Chloride: 105 mmol/L (ref 98–111)
Creatinine, Ser: 0.64 mg/dL (ref 0.44–1.00)
GFR, Estimated: >60 mL/min (ref >60)
Glucose, Bld: 170 mg/dL — ABNORMAL HIGH (ref 70–99)
Potassium: 3.4 mmol/L — ABNORMAL LOW (ref 3.5–5.1)
Sodium: 138 mmol/L (ref 135–145)
Total Bilirubin: 0.5 mg/dL (ref 0.3–1.2)
Total Protein: 7.4 g/dL (ref 6.5–8.1)

## 2022-10-19 LAB — RAPID URINE DRUG SCREEN, HOSP PERFORMED
Amphetamines: NOT DETECTED
Barbiturates: NOT DETECTED
Benzodiazepines: NOT DETECTED
Cocaine: NOT DETECTED
Opiates: NOT DETECTED
Tetrahydrocannabinol: NOT DETECTED

## 2022-10-19 LAB — CBC WITH DIFFERENTIAL/PLATELET
Abs Immature Granulocytes: 0.04 10*3/uL (ref 0.00–0.07)
Basophils Absolute: 0.1 10*3/uL (ref 0.0–0.1)
Basophils Relative: 1 %
Eosinophils Absolute: 0.1 10*3/uL (ref 0.0–0.5)
Eosinophils Relative: 1 %
HCT: 39.5 % (ref 36.0–46.0)
Hemoglobin: 13.1 g/dL (ref 12.0–15.0)
Immature Granulocytes: 0 %
Lymphocytes Relative: 19 %
Lymphs Abs: 2.2 10*3/uL (ref 0.7–4.0)
MCH: 30.5 pg (ref 26.0–34.0)
MCHC: 33.2 g/dL (ref 30.0–36.0)
MCV: 92.1 fL (ref 80.0–100.0)
Monocytes Absolute: 0.4 10*3/uL (ref 0.1–1.0)
Monocytes Relative: 3 %
Neutro Abs: 8.4 10*3/uL — ABNORMAL HIGH (ref 1.7–7.7)
Neutrophils Relative %: 76 %
Platelets: 313 10*3/uL (ref 150–400)
RBC: 4.29 MIL/uL (ref 3.87–5.11)
RDW: 12 % (ref 11.5–15.5)
WBC: 11.1 10*3/uL — ABNORMAL HIGH (ref 4.0–10.5)
nRBC: 0 % (ref 0.0–0.2)

## 2022-10-19 LAB — ETHANOL: Alcohol, Ethyl (B): 45 mg/dL — ABNORMAL HIGH (ref <10)

## 2022-10-19 LAB — HCG, SERUM, QUALITATIVE: Preg, Serum: NEGATIVE

## 2022-10-19 LAB — ACETAMINOPHEN LEVEL: Acetaminophen (Tylenol), Serum: <10 ug/mL — ABNORMAL LOW (ref 10–30)

## 2022-10-19 LAB — CBG MONITORING, ED: Glucose-Capillary: 158 mg/dL — ABNORMAL HIGH (ref 70–99)

## 2022-10-19 LAB — SALICYLATE LEVEL: Salicylate Lvl: <7 mg/dL — ABNORMAL LOW (ref 7.0–30.0)

## 2022-10-19 NOTE — ED Notes (Signed)
Reported pt eloped from triage and unable to be located by staff.
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
## 2022-10-19 NOTE — ED Notes (Signed)
Pt remained in triage room 2 awaiting for room assignment. When taking pt to room pt was no longer in triage. NT unable to find pt in bathroom.

## 2022-11-06 IMAGING — MG DIGITAL SCREENING BREAST BILAT IMPLANT W/ TOMO W/ CAD
8 of 12 series · 8 of 28 positions shown · non-contrast
Comparison: None.

CLINICAL DATA: Screening.

EXAM:
DIGITAL SCREENING BILATERAL MAMMOGRAM WITH IMPLANTS, CAD AND
TOMOSYNTHESIS
TECHNIQUE: Bilateral screening digital craniocaudal and mediolateral oblique
mammograms were obtained. Bilateral screening digital breast
tomosynthesis was performed. The images were evaluated with
computer-aided detection. Standard and/or implant displaced views
were performed.

[L MLO]
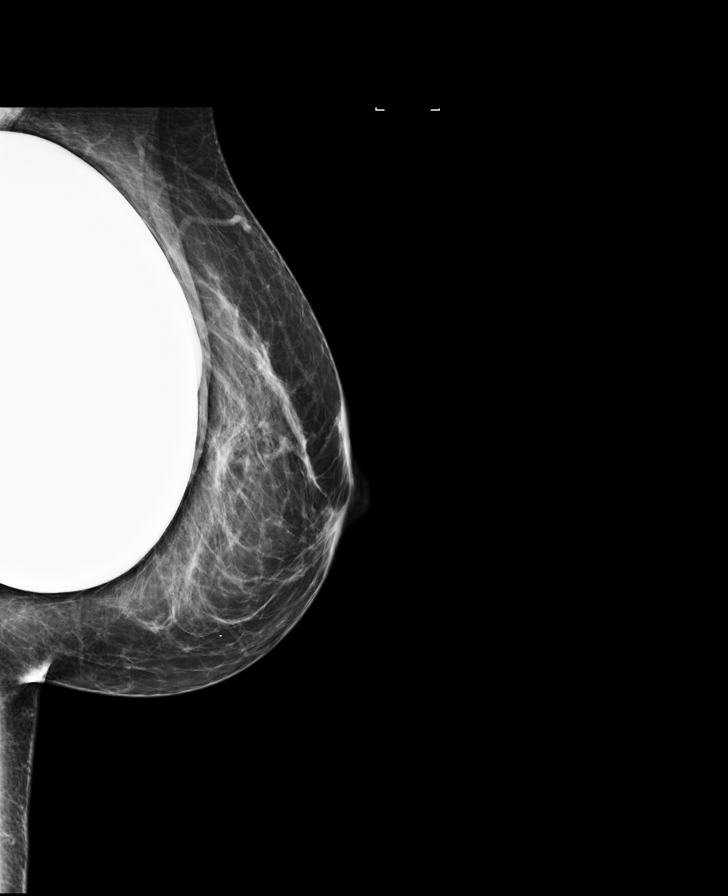

[L CC]
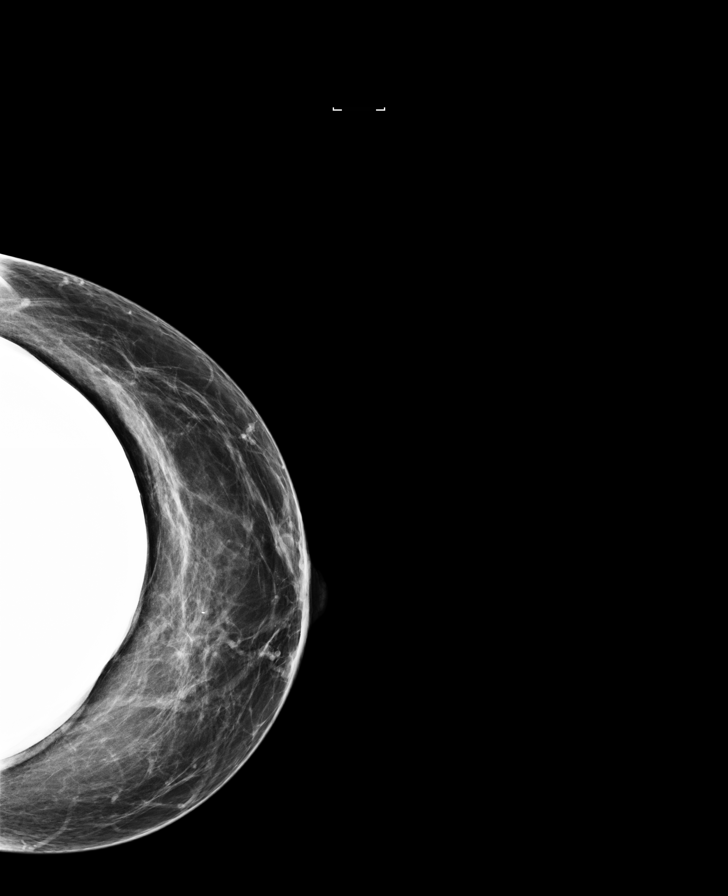

[R CC]
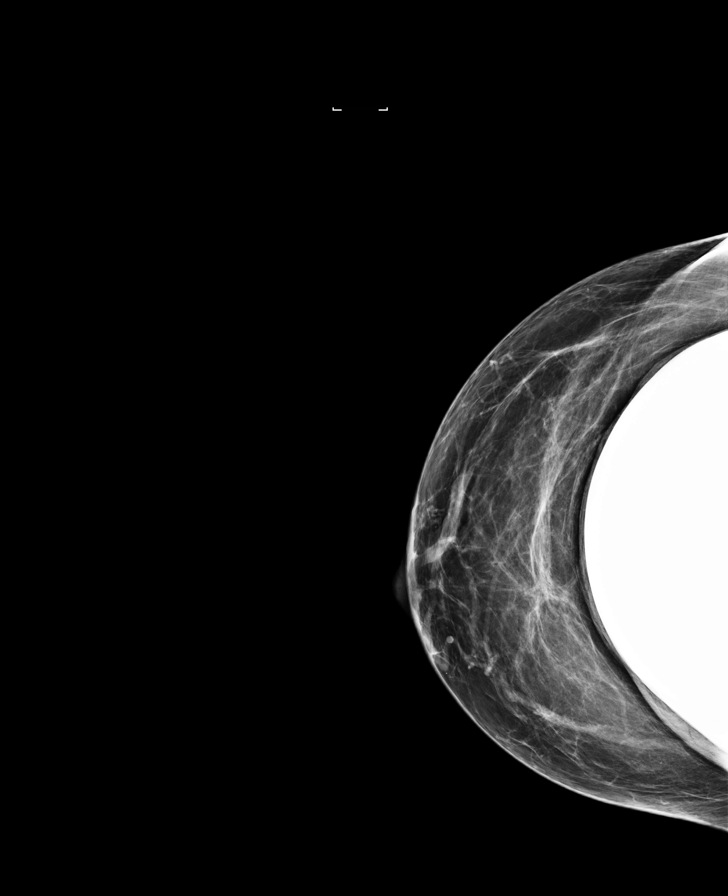

[R MLO]
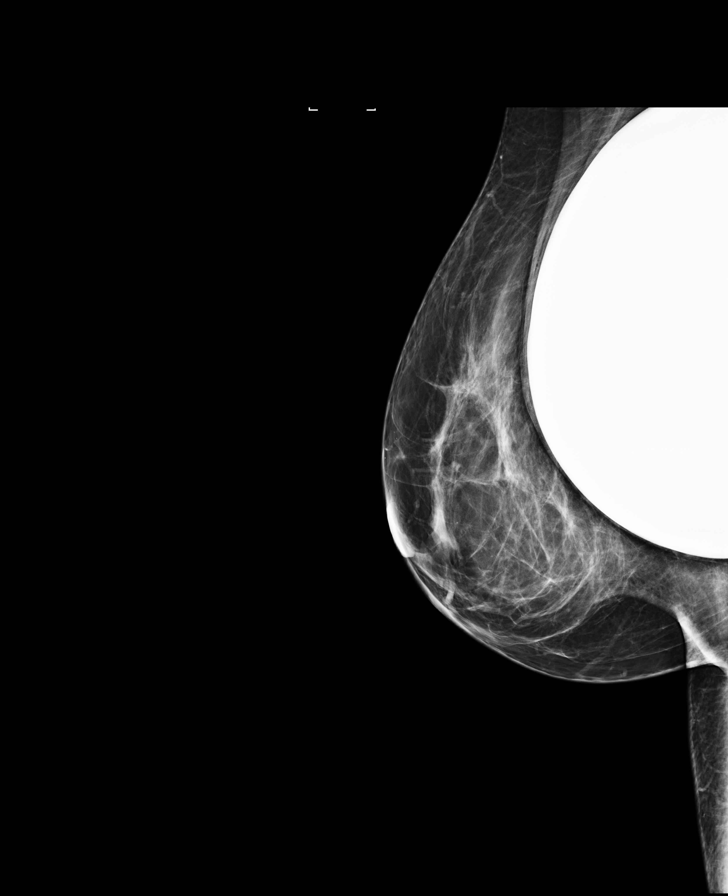

[R CC synth-2D]
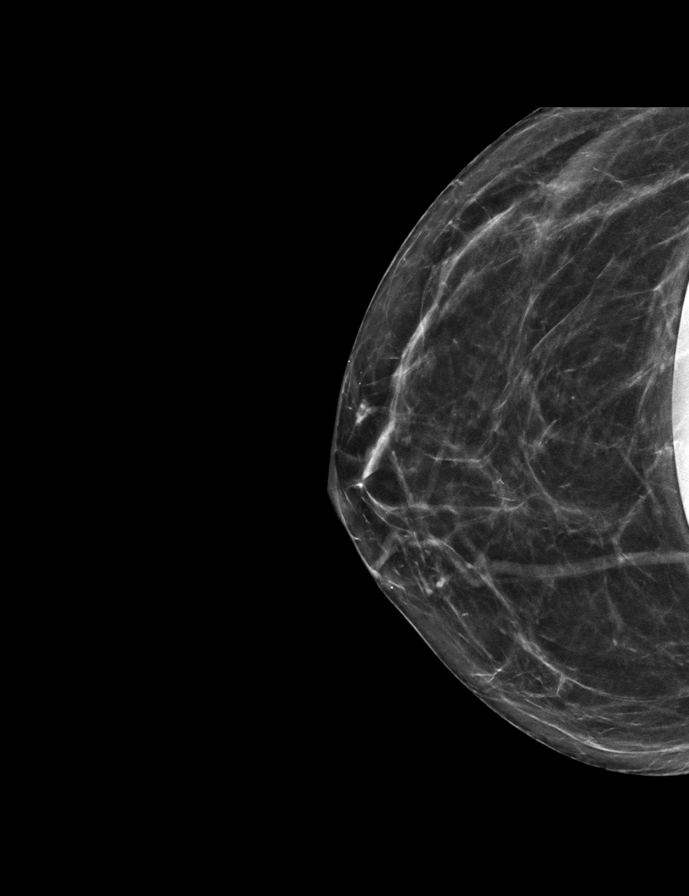

[R MLO synth-2D]
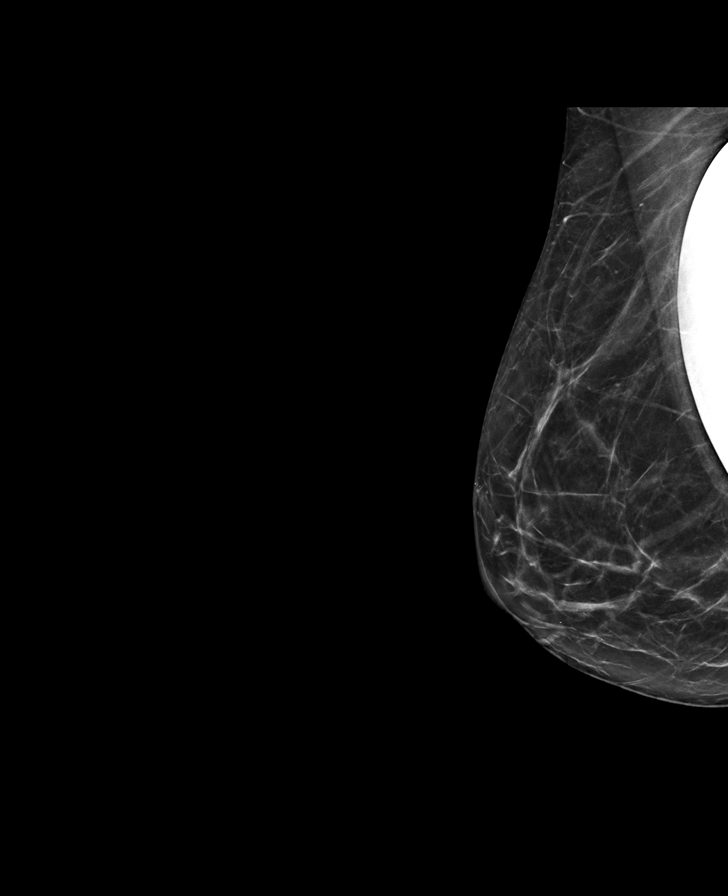

[L MLO synth-2D]
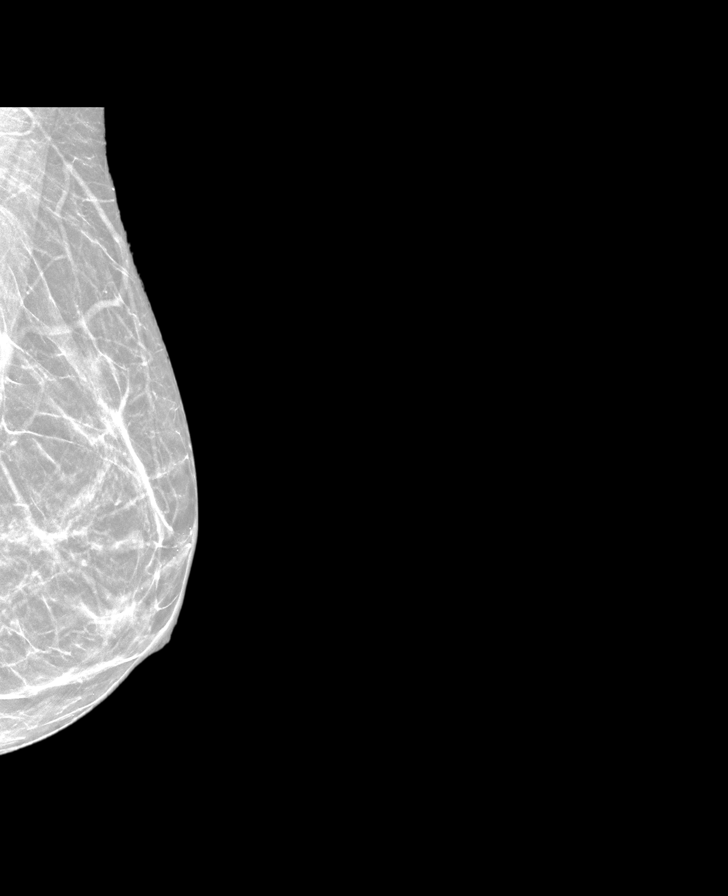

[L CC synth-2D]
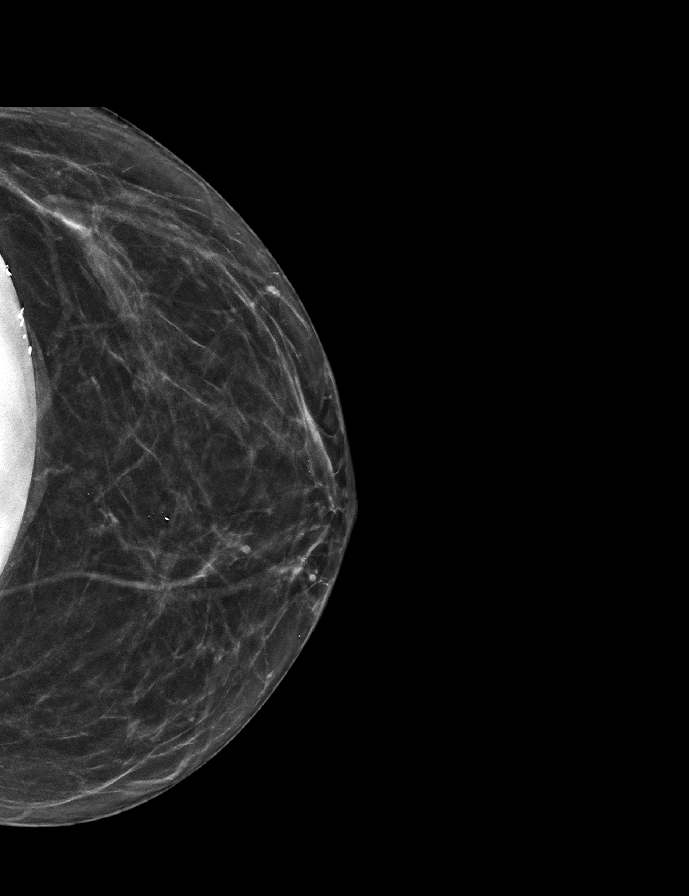

[8 of 28 positions shown; findings below may reference images not displayed]

Outside priors have been requested. If they are
obtained, an addendum will be issued.

ACR Breast Density Category b: There are scattered areas of
fibroglandular density.
FINDINGS: The patient has retropectoral silicone implants. There are no
findings suspicious for malignancy.
IMPRESSION: No mammographic evidence of malignancy. A result letter of this
screening mammogram will be mailed directly to the patient.

RECOMMENDATION:
Screening mammogram in one year. (Code:92-6-Z6Q)

BI-RADS CATEGORY  1:  Negative.

## 2023-03-04 ENCOUNTER — Emergency Department (HOSPITAL_COMMUNITY)
Admission: EM | Admit: 2023-03-04 | Discharge: 2023-03-04 | Discharge status: Against Medical Advice | Payer: MEDICAID | Attending: Emergency Medicine | Admitting: Emergency Medicine

## 2023-03-04 ENCOUNTER — Emergency Department (HOSPITAL_COMMUNITY): Payer: MEDICAID

## 2023-03-04 ENCOUNTER — Encounter (HOSPITAL_COMMUNITY): Payer: Self-pay

## 2023-03-04 ENCOUNTER — Other Ambulatory Visit: Payer: Self-pay

## 2023-03-04 DIAGNOSIS — Z5321 Procedure and treatment not carried out due to patient leaving prior to being seen by health care provider: Secondary | ICD-10-CM | POA: Insufficient documentation

## 2023-03-04 DIAGNOSIS — R451 Restlessness and agitation: Secondary | ICD-10-CM | POA: Insufficient documentation

## 2023-03-04 LAB — COMPREHENSIVE METABOLIC PANEL
ALT: 14 U/L (ref 0–44)
AST: 23 U/L (ref 15–41)
Albumin: 4 g/dL (ref 3.5–5.0)
Alkaline Phosphatase: 38 U/L (ref 38–126)
Anion gap: 10 (ref 5–15)
BUN: 13 mg/dL (ref 6–20)
CO2: 25 mmol/L (ref 22–32)
Calcium: 8.9 mg/dL (ref 8.9–10.3)
Chloride: 105 mmol/L (ref 98–111)
Creatinine, Ser: 0.75 mg/dL (ref 0.44–1.00)
GFR, Estimated: >60 mL/min (ref >60)
Glucose, Bld: 124 mg/dL — ABNORMAL HIGH (ref 70–99)
Potassium: 3.9 mmol/L (ref 3.5–5.1)
Sodium: 140 mmol/L (ref 135–145)
Total Bilirubin: 0.5 mg/dL (ref 0.0–1.2)
Total Protein: 6.8 g/dL (ref 6.5–8.1)

## 2023-03-04 LAB — CBC WITH DIFFERENTIAL/PLATELET
Abs Immature Granulocytes: 0.06 10*3/uL (ref 0.00–0.07)
Basophils Absolute: 0.1 10*3/uL (ref 0.0–0.1)
Basophils Relative: 0 %
Eosinophils Absolute: 0.1 10*3/uL (ref 0.0–0.5)
Eosinophils Relative: 1 %
HCT: 36.2 % (ref 36.0–46.0)
Hemoglobin: 12.2 g/dL (ref 12.0–15.0)
Immature Granulocytes: 0 %
Lymphocytes Relative: 9 %
Lymphs Abs: 1.3 10*3/uL (ref 0.7–4.0)
MCH: 30.6 pg (ref 26.0–34.0)
MCHC: 33.7 g/dL (ref 30.0–36.0)
MCV: 90.7 fL (ref 80.0–100.0)
Monocytes Absolute: 1.1 10*3/uL — ABNORMAL HIGH (ref 0.1–1.0)
Monocytes Relative: 7 %
Neutro Abs: 12.3 10*3/uL — ABNORMAL HIGH (ref 1.7–7.7)
Neutrophils Relative %: 83 %
Platelets: 305 10*3/uL (ref 150–400)
RBC: 3.99 MIL/uL (ref 3.87–5.11)
RDW: 11.6 % (ref 11.5–15.5)
WBC: 14.9 10*3/uL — ABNORMAL HIGH (ref 4.0–10.5)
nRBC: 0 % (ref 0.0–0.2)

## 2023-03-04 LAB — SEDIMENTATION RATE: Sed Rate: 5 mm/h (ref 0–22)

## 2023-03-04 LAB — HCG, SERUM, QUALITATIVE: Preg, Serum: NEGATIVE

## 2023-03-04 LAB — RAPID URINE DRUG SCREEN, HOSP PERFORMED
Amphetamines: NOT DETECTED
Barbiturates: NOT DETECTED
Benzodiazepines: NOT DETECTED
Cocaine: NOT DETECTED
Opiates: NOT DETECTED
Tetrahydrocannabinol: NOT DETECTED

## 2023-03-04 LAB — ETHANOL: Alcohol, Ethyl (B): <10 mg/dL (ref <10)
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
## 2023-03-04 NOTE — ED Provider Triage Note (Signed)
Emergency Medicine Provider Triage Evaluation Note  Monica Eaton , a 48 y.o. female  was evaluated in triage.   Pt comes in with cc of ams.  She has history of IV drug use, MSSA cervical discitis/osteomyelitis with MSSA bacteremia on chronic prophylactic Bactrim DS, chronic hep C.  Pt states that she went to take a shower and then the next thing she recalls is that she was having a bad dream.  Per father, patient's mother alerted him that she was shaking. He noted that pt's face looked stress, she was breathing differently and she had no control over her body - her arms and legs were "wailing". Pt was agitated and combative, she was cursing at family.  Pt denies headache, neck pain. She vapes and has been sober for 15 months.   Review of Systems   Negative: Headache, neck pain, weakness, confusion  Physical Exam  BP 132/79 (BP Location: Left Arm)   Pulse (!) 114   Temp 99 F (37.2 C) (Oral)   Resp 16   SpO2 97%  Gen:   Awake, no distress   Resp:  Normal effort  MSK:   Moves extremities without difficulty  Other:  Regular rate and rhythm, cranial nerves II through XII intact, no psychosis  Medical Decision Making  Medically screening exam initiated at 5:09 PM.  Appropriate orders placed.  Monica Eaton was informed that the remainder of the evaluation will be completed by another provider, this initial triage assessment does not replace that evaluation, and the importance of remaining in the ED until their evaluation is complete.  Will get basic labs including CT scan of the brain and a UDS.  Sed rate, CRP ordered -but mainly because family states that patient was acting erratically like this when she was found to have bacteremia.     Monica Kaplan, MD 03/04/23 [phone]

## 2023-07-25 ENCOUNTER — Other Ambulatory Visit: Payer: Self-pay | Admitting: Neurosurgery

## 2023-07-25 DIAGNOSIS — G062 Extradural and subdural abscess, unspecified: Secondary | ICD-10-CM

## 2023-07-26 ENCOUNTER — Encounter: Payer: Self-pay | Admitting: Neurosurgery

## 2023-08-03 ENCOUNTER — Ambulatory Visit
Admission: RE | Admit: 2023-08-03 | Discharge: 2023-08-03 | Disposition: A | Discharge status: Home / Self Care | Payer: MEDICAID | Source: Ambulatory Visit | Attending: Neurosurgery | Admitting: Neurosurgery

## 2023-08-03 DIAGNOSIS — G062 Extradural and subdural abscess, unspecified: Secondary | ICD-10-CM
